# Patient Record
Sex: Female | Born: 1945 | Race: White | Hispanic: No | Smoking: Never smoker
Health system: Southern US, Community
[De-identification: ages and names within clinical notes are randomized; demographics above are authoritative.]

## PROBLEM LIST (undated history)

## (undated) DIAGNOSIS — I779 Disorder of arteries and arterioles, unspecified: Secondary | ICD-10-CM

## (undated) DIAGNOSIS — R0981 Nasal congestion: Secondary | ICD-10-CM

## (undated) DIAGNOSIS — I639 Cerebral infarction, unspecified: Secondary | ICD-10-CM

## (undated) DIAGNOSIS — J3089 Other allergic rhinitis: Secondary | ICD-10-CM

## (undated) DIAGNOSIS — M8580 Other specified disorders of bone density and structure, unspecified site: Secondary | ICD-10-CM

## (undated) DIAGNOSIS — E785 Hyperlipidemia, unspecified: Secondary | ICD-10-CM

## (undated) DIAGNOSIS — K5790 Diverticulosis of intestine, part unspecified, without perforation or abscess without bleeding: Secondary | ICD-10-CM

## (undated) DIAGNOSIS — M1990 Unspecified osteoarthritis, unspecified site: Secondary | ICD-10-CM

## (undated) DIAGNOSIS — G459 Transient cerebral ischemic attack, unspecified: Secondary | ICD-10-CM

## (undated) DIAGNOSIS — E119 Type 2 diabetes mellitus without complications: Secondary | ICD-10-CM

## (undated) DIAGNOSIS — G4730 Sleep apnea, unspecified: Secondary | ICD-10-CM

## (undated) DIAGNOSIS — K219 Gastro-esophageal reflux disease without esophagitis: Secondary | ICD-10-CM

## (undated) HISTORY — PX: TUBAL LIGATION: SHX77

## (undated) HISTORY — DX: Type 2 diabetes mellitus without complications: E11.9

## (undated) HISTORY — PX: BREAST CYST ASPIRATION: SHX578

## (undated) HISTORY — DX: Hyperlipidemia, unspecified: E78.5

---

## 2001-11-09 ENCOUNTER — Other Ambulatory Visit: Admission: RE | Admit: 2001-11-09 | Discharge: 2001-11-09 | Payer: Self-pay | Admitting: Family Medicine

## 2005-01-14 ENCOUNTER — Ambulatory Visit: Payer: Self-pay | Admitting: Family Medicine

## 2006-02-08 ENCOUNTER — Ambulatory Visit: Payer: Self-pay | Admitting: Family Medicine

## 2006-12-02 DIAGNOSIS — G4730 Sleep apnea, unspecified: Secondary | ICD-10-CM | POA: Insufficient documentation

## 2006-12-14 DIAGNOSIS — M19279 Secondary osteoarthritis, unspecified ankle and foot: Secondary | ICD-10-CM | POA: Insufficient documentation

## 2007-02-10 ENCOUNTER — Ambulatory Visit: Payer: Self-pay | Admitting: Family Medicine

## 2007-02-20 DIAGNOSIS — E785 Hyperlipidemia, unspecified: Secondary | ICD-10-CM | POA: Insufficient documentation

## 2007-02-20 DIAGNOSIS — E7800 Pure hypercholesterolemia, unspecified: Secondary | ICD-10-CM | POA: Insufficient documentation

## 2007-02-20 DIAGNOSIS — E1169 Type 2 diabetes mellitus with other specified complication: Secondary | ICD-10-CM | POA: Insufficient documentation

## 2007-02-20 DIAGNOSIS — E782 Mixed hyperlipidemia: Secondary | ICD-10-CM | POA: Insufficient documentation

## 2007-07-05 ENCOUNTER — Other Ambulatory Visit: Payer: Self-pay

## 2007-07-05 ENCOUNTER — Emergency Department: Payer: Self-pay

## 2008-01-07 DIAGNOSIS — R7309 Other abnormal glucose: Secondary | ICD-10-CM | POA: Insufficient documentation

## 2008-02-12 ENCOUNTER — Ambulatory Visit: Payer: Self-pay | Admitting: Family Medicine

## 2008-08-30 HISTORY — PX: BREAST SURGERY: SHX581

## 2008-08-30 HISTORY — PX: BREAST BIOPSY: SHX20

## 2009-04-10 ENCOUNTER — Ambulatory Visit: Payer: Self-pay | Admitting: Family Medicine

## 2009-04-25 DIAGNOSIS — E039 Hypothyroidism, unspecified: Secondary | ICD-10-CM | POA: Insufficient documentation

## 2009-04-27 DIAGNOSIS — L989 Disorder of the skin and subcutaneous tissue, unspecified: Secondary | ICD-10-CM | POA: Insufficient documentation

## 2009-04-28 ENCOUNTER — Ambulatory Visit: Payer: Self-pay | Admitting: Family Medicine

## 2009-06-02 ENCOUNTER — Ambulatory Visit: Payer: Self-pay | Admitting: General Surgery

## 2009-09-01 DIAGNOSIS — Z8673 Personal history of transient ischemic attack (TIA), and cerebral infarction without residual deficits: Secondary | ICD-10-CM | POA: Insufficient documentation

## 2009-09-12 DIAGNOSIS — I658 Occlusion and stenosis of other precerebral arteries: Secondary | ICD-10-CM | POA: Insufficient documentation

## 2010-02-25 LAB — HM PAP SMEAR: HM Pap smear: NEGATIVE

## 2010-04-14 ENCOUNTER — Ambulatory Visit: Payer: Self-pay | Admitting: Family Medicine

## 2011-05-19 ENCOUNTER — Ambulatory Visit: Payer: Self-pay | Admitting: Family Medicine

## 2011-08-26 ENCOUNTER — Ambulatory Visit: Payer: Self-pay | Admitting: Family Medicine

## 2011-10-22 ENCOUNTER — Other Ambulatory Visit: Payer: Self-pay | Admitting: Diagnostic Neuroimaging

## 2011-10-22 DIAGNOSIS — R42 Dizziness and giddiness: Secondary | ICD-10-CM

## 2011-11-02 ENCOUNTER — Ambulatory Visit
Admission: RE | Admit: 2011-11-02 | Discharge: 2011-11-02 | Disposition: A | Discharge status: Home / Self Care | Payer: Medicare Other | Source: Ambulatory Visit | Attending: Diagnostic Neuroimaging | Admitting: Diagnostic Neuroimaging

## 2011-11-02 DIAGNOSIS — R42 Dizziness and giddiness: Secondary | ICD-10-CM

## 2011-11-02 MED ORDER — GADOBENATE DIMEGLUMINE 529 MG/ML IV SOLN
15.0000 mL | Freq: Once | INTRAVENOUS | Status: AC | PRN
  Administered 2011-11-02: 15 mL via INTRAVENOUS

## 2011-11-23 ENCOUNTER — Other Ambulatory Visit: Payer: Self-pay | Admitting: Diagnostic Neuroimaging

## 2011-11-23 DIAGNOSIS — R42 Dizziness and giddiness: Secondary | ICD-10-CM

## 2011-12-15 ENCOUNTER — Ambulatory Visit: Payer: Self-pay | Admitting: Gastroenterology

## 2011-12-15 LAB — HM COLONOSCOPY

## 2012-07-06 ENCOUNTER — Ambulatory Visit: Payer: Self-pay | Admitting: Family Medicine

## 2012-10-31 ENCOUNTER — Ambulatory Visit
Admission: RE | Admit: 2012-10-31 | Discharge: 2012-10-31 | Disposition: A | Discharge status: Home / Self Care | Payer: Medicare Other | Source: Ambulatory Visit | Attending: Diagnostic Neuroimaging | Admitting: Diagnostic Neuroimaging

## 2012-10-31 DIAGNOSIS — R42 Dizziness and giddiness: Secondary | ICD-10-CM

## 2012-11-07 LAB — HM DEXA SCAN

## 2013-03-17 ENCOUNTER — Emergency Department: Payer: Self-pay | Admitting: Emergency Medicine

## 2013-03-17 LAB — URINALYSIS, COMPLETE
Bilirubin,UR: NEGATIVE
Blood: NEGATIVE
Ketone: NEGATIVE
Leukocyte Esterase: NEGATIVE
Nitrite: NEGATIVE
Ph: 7 (ref 4.5–8.0)
Protein: NEGATIVE
Squamous Epithelial: 1
WBC UR: 1 /HPF (ref 0–5)

## 2013-03-17 LAB — CBC
HGB: 14.8 g/dL (ref 12.0–16.0)
MCH: 32.5 pg (ref 26.0–34.0)
MCHC: 34.2 g/dL (ref 32.0–36.0)
MCV: 95 fL (ref 80–100)
Platelet: 213 10*3/uL (ref 150–440)
WBC: 5.2 10*3/uL (ref 3.6–11.0)

## 2013-03-17 LAB — COMPREHENSIVE METABOLIC PANEL
Albumin: 3.8 g/dL (ref 3.4–5.0)
Anion Gap: 3 — ABNORMAL LOW (ref 7–16)
Calcium, Total: 9.1 mg/dL (ref 8.5–10.1)
Chloride: 105 mmol/L (ref 98–107)
Co2: 30 mmol/L (ref 21–32)
Glucose: 192 mg/dL — ABNORMAL HIGH (ref 65–99)
Potassium: 3.6 mmol/L (ref 3.5–5.1)
SGOT(AST): 20 U/L (ref 15–37)
SGPT (ALT): 23 U/L (ref 12–78)

## 2013-04-19 ENCOUNTER — Ambulatory Visit: Payer: Self-pay | Admitting: Family Medicine

## 2013-07-09 ENCOUNTER — Ambulatory Visit: Payer: Self-pay | Admitting: Family Medicine

## 2014-04-05 ENCOUNTER — Ambulatory Visit: Payer: Self-pay | Admitting: Family Medicine

## 2014-06-07 LAB — BASIC METABOLIC PANEL
BUN: 17 mg/dL (ref 4–21)
Creatinine: 0.8 mg/dL (ref <=1.1)
Potassium: 4.3 mmol/L (ref 3.4–5.3)
Sodium: 139 mmol/L (ref 137–147)

## 2014-06-07 LAB — HEPATIC FUNCTION PANEL
ALT: 30 U/L (ref 7–35)
AST: 22 U/L (ref 13–35)

## 2014-06-26 ENCOUNTER — Ambulatory Visit: Payer: Self-pay | Admitting: Family Medicine

## 2014-07-10 LAB — HM MAMMOGRAPHY

## 2014-08-01 ENCOUNTER — Ambulatory Visit: Payer: Self-pay | Admitting: Family Medicine

## 2014-09-24 DIAGNOSIS — G478 Other sleep disorders: Secondary | ICD-10-CM | POA: Diagnosis not present

## 2014-09-24 DIAGNOSIS — G479 Sleep disorder, unspecified: Secondary | ICD-10-CM | POA: Diagnosis not present

## 2014-09-24 DIAGNOSIS — G4733 Obstructive sleep apnea (adult) (pediatric): Secondary | ICD-10-CM | POA: Diagnosis not present

## 2014-09-24 DIAGNOSIS — G4730 Sleep apnea, unspecified: Secondary | ICD-10-CM | POA: Diagnosis not present

## 2014-10-25 DIAGNOSIS — G4733 Obstructive sleep apnea (adult) (pediatric): Secondary | ICD-10-CM | POA: Diagnosis not present

## 2014-10-25 DIAGNOSIS — G479 Sleep disorder, unspecified: Secondary | ICD-10-CM | POA: Diagnosis not present

## 2014-10-25 DIAGNOSIS — G4730 Sleep apnea, unspecified: Secondary | ICD-10-CM | POA: Diagnosis not present

## 2014-10-25 DIAGNOSIS — G478 Other sleep disorders: Secondary | ICD-10-CM | POA: Diagnosis not present

## 2014-10-29 DIAGNOSIS — G4733 Obstructive sleep apnea (adult) (pediatric): Secondary | ICD-10-CM | POA: Diagnosis not present

## 2014-11-06 DIAGNOSIS — R7309 Other abnormal glucose: Secondary | ICD-10-CM | POA: Diagnosis not present

## 2014-11-06 DIAGNOSIS — E119 Type 2 diabetes mellitus without complications: Secondary | ICD-10-CM | POA: Diagnosis not present

## 2014-11-06 DIAGNOSIS — E559 Vitamin D deficiency, unspecified: Secondary | ICD-10-CM | POA: Diagnosis not present

## 2014-11-06 LAB — CBC AND DIFFERENTIAL
HCT: 40 % (ref 36–46)
Hemoglobin: 13.6 g/dL (ref 12.0–16.0)
Platelets: 227 10*3/uL (ref 150–399)
WBC: 4.7 10*3/mL

## 2014-11-06 LAB — LIPID PANEL
CHOLESTEROL: 374 mg/dL — AB (ref 0–200)
HDL: 58 mg/dL (ref 35–70)
LDL Cholesterol: 286 mg/dL
Triglycerides: 150 mg/dL (ref 40–160)

## 2014-11-06 LAB — HEMOGLOBIN A1C: HEMOGLOBIN A1C: 6.7 % — AB (ref 4.0–6.0)

## 2014-11-06 LAB — TSH: TSH: 2.94 u[IU]/mL (ref <=5.90)

## 2014-11-23 DIAGNOSIS — G479 Sleep disorder, unspecified: Secondary | ICD-10-CM | POA: Diagnosis not present

## 2014-11-23 DIAGNOSIS — G4733 Obstructive sleep apnea (adult) (pediatric): Secondary | ICD-10-CM | POA: Diagnosis not present

## 2014-11-23 DIAGNOSIS — G478 Other sleep disorders: Secondary | ICD-10-CM | POA: Diagnosis not present

## 2014-11-23 DIAGNOSIS — G4730 Sleep apnea, unspecified: Secondary | ICD-10-CM | POA: Diagnosis not present

## 2014-12-24 DIAGNOSIS — G4733 Obstructive sleep apnea (adult) (pediatric): Secondary | ICD-10-CM | POA: Diagnosis not present

## 2014-12-24 DIAGNOSIS — G478 Other sleep disorders: Secondary | ICD-10-CM | POA: Diagnosis not present

## 2014-12-24 DIAGNOSIS — G479 Sleep disorder, unspecified: Secondary | ICD-10-CM | POA: Diagnosis not present

## 2014-12-24 DIAGNOSIS — G4730 Sleep apnea, unspecified: Secondary | ICD-10-CM | POA: Diagnosis not present

## 2014-12-25 DIAGNOSIS — M797 Fibromyalgia: Secondary | ICD-10-CM | POA: Insufficient documentation

## 2014-12-25 DIAGNOSIS — G43909 Migraine, unspecified, not intractable, without status migrainosus: Secondary | ICD-10-CM | POA: Insufficient documentation

## 2014-12-25 DIAGNOSIS — F40243 Fear of flying: Secondary | ICD-10-CM | POA: Insufficient documentation

## 2014-12-25 DIAGNOSIS — I6529 Occlusion and stenosis of unspecified carotid artery: Secondary | ICD-10-CM | POA: Insufficient documentation

## 2014-12-25 DIAGNOSIS — E559 Vitamin D deficiency, unspecified: Secondary | ICD-10-CM | POA: Insufficient documentation

## 2014-12-25 DIAGNOSIS — M85832 Other specified disorders of bone density and structure, left forearm: Secondary | ICD-10-CM | POA: Insufficient documentation

## 2014-12-25 DIAGNOSIS — M8580 Other specified disorders of bone density and structure, unspecified site: Secondary | ICD-10-CM | POA: Insufficient documentation

## 2014-12-25 DIAGNOSIS — E119 Type 2 diabetes mellitus without complications: Secondary | ICD-10-CM | POA: Insufficient documentation

## 2014-12-25 DIAGNOSIS — I729 Aneurysm of unspecified site: Secondary | ICD-10-CM | POA: Insufficient documentation

## 2014-12-25 DIAGNOSIS — J309 Allergic rhinitis, unspecified: Secondary | ICD-10-CM | POA: Insufficient documentation

## 2014-12-25 DIAGNOSIS — F40228 Other natural environment type phobia: Secondary | ICD-10-CM | POA: Insufficient documentation

## 2015-01-23 DIAGNOSIS — G479 Sleep disorder, unspecified: Secondary | ICD-10-CM | POA: Diagnosis not present

## 2015-01-23 DIAGNOSIS — G4730 Sleep apnea, unspecified: Secondary | ICD-10-CM | POA: Diagnosis not present

## 2015-01-23 DIAGNOSIS — G4733 Obstructive sleep apnea (adult) (pediatric): Secondary | ICD-10-CM | POA: Diagnosis not present

## 2015-01-23 DIAGNOSIS — G478 Other sleep disorders: Secondary | ICD-10-CM | POA: Diagnosis not present

## 2015-01-29 DIAGNOSIS — G4733 Obstructive sleep apnea (adult) (pediatric): Secondary | ICD-10-CM | POA: Diagnosis not present

## 2015-02-11 ENCOUNTER — Encounter: Payer: Self-pay | Admitting: Family Medicine

## 2015-02-11 ENCOUNTER — Ambulatory Visit (INDEPENDENT_AMBULATORY_CARE_PROVIDER_SITE_OTHER): Payer: Commercial Managed Care - HMO | Admitting: Family Medicine

## 2015-02-11 VITALS — BP 136/78 | HR 64 | Temp 98.2°F | Resp 16 | Ht 65.0 in | Wt 176.0 lb

## 2015-02-11 DIAGNOSIS — E7800 Pure hypercholesterolemia, unspecified: Secondary | ICD-10-CM

## 2015-02-11 DIAGNOSIS — M1990 Unspecified osteoarthritis, unspecified site: Secondary | ICD-10-CM | POA: Diagnosis not present

## 2015-02-11 DIAGNOSIS — E119 Type 2 diabetes mellitus without complications: Secondary | ICD-10-CM

## 2015-02-11 LAB — POCT GLYCOSYLATED HEMOGLOBIN (HGB A1C)

## 2015-02-11 NOTE — Progress Notes (Signed)
Subjective:     Patient ID: Christine Richards, female   DOB: [birth year], 69 y.o.   MRN: [id number]  Diabetes She presents for her follow-up diabetic visit. She has type 2 diabetes mellitus. No MedicAlert identification noted. Her disease course has been stable (Last A1C was 6.7% on 11/06/2014). Pertinent negatives for hypoglycemia include no dizziness, headaches, seizures, speech difficulty or tremors. There are no diabetic associated symptoms. Pertinent negatives for diabetes include no chest pain, no fatigue, no polydipsia, no polyphagia, no polyuria and no weakness. There are no hypoglycemic complications. Symptoms are stable. There are no diabetic complications. Current diabetic treatment includes diet. She is compliant with treatment all of the time. There is no change in her home blood glucose trend. Eye exam is current.  Hyperlipidemia This is a chronic problem. The problem is uncontrolled (Last check was 06/07/2014.  Total Cholesterol:  374; Tri:  150;  HDL:  58;  LDL:  286). Recent lipid tests were reviewed and are high. Exacerbating diseases include diabetes. Associated symptoms include myalgias and shortness of breath (Only when climbing steps.). Pertinent negatives include no chest pain. Current antihyperlipidemic treatment includes exercise and diet change.  Arthritis The disease course has been worsening. She complains of pain and joint swelling (Hands sometimes swell.  ). Pertinent negatives include no diarrhea, fatigue or fever. Her family medical history includes family history of rheumatoid arthritis (Pt reports having an Uncle with RA.).     Patient Active Problem List   Diagnosis Date Noted  . Allergic rhinitis 12/25/2014  . Aneurysm 12/25/2014  . 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 Controlled diabetes mellitus type II without complication 83/25/4982  . Aerophobia 12/25/2014  . Fibrositis 12/25/2014  . Headache, migraine 12/25/2014  . Osteopenia 12/25/2014  . Avitaminosis D  12/25/2014  . 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 Acquired hypothyroidism 04/25/2009  . Abnormal blood sugar 01/07/2008  . Hypercholesteremia 02/20/2007  . 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 Apnea, sleep 12/02/2006   Family History  Problem Relation Age of Onset  . Congestive Heart Failure Mother   . Heart attack Mother   . Dementia Father   . Heart attack Father   . Prostate cancer Father   . Atrial fibrillation Sister   . Hyperlipidemia Sister   . Heart disease Brother    History   Social History  . Marital Status: Divorced    Spouse Name: N/A  . Number of Children: 2  . Years of Education: College   Occupational History  .      Full Time   Social History Main Topics  . Smoking status: Never Smoker   . Smokeless tobacco: Former Systems developer  . Alcohol Use: No  . Drug Use: No  . Sexual Activity: Not on file   Other Topics Concern  . Not on file   Social History Narrative   Past Surgical History  Procedure Laterality Date  . Tubal ligation    . Breast surgery  2010    biopsy   Allergies  Allergen Reactions  . Nitrofurantoin   . Statins     Other reaction(s): Muscle Pain, Weakness Statins   Current Outpatient Prescriptions on File Prior to Visit  Medication Sig Dispense Refill  . Cholecalciferol (VITAMIN D) 2000 UNITS CAPS Take 1 tablet by mouth 2 (two) times daily.    . Omega-3  Fatty Acids (FISH OIL) 1000 MG CAPS Take 2 tablets by mouth daily.     No current facility-administered medications on file prior to visit.     Review of Systems  Constitutional: Negative for fever, chills, diaphoresis, activity change, appetite change, fatigue and unexpected weight change.  Respiratory: Positive for shortness of breath (Only when climbing steps.). Negative for apnea, cough,  choking, chest tightness, wheezing and stridor.   Cardiovascular: Negative for chest pain, palpitations and leg swelling.  Gastrointestinal: Negative for nausea, vomiting, abdominal pain, diarrhea, constipation, blood in stool, abdominal distention, anal bleeding and rectal pain.  Endocrine: Negative for cold intolerance, heat intolerance, polydipsia, polyphagia and polyuria.  Musculoskeletal: Positive for myalgias, joint swelling (Hands sometimes swell.  ), arthralgias (Pt is concerned of RA.  ) and arthritis. Negative for back pain, gait problem, neck pain and neck stiffness.  Neurological: Negative for dizziness, tremors, seizures, syncope, facial asymmetry, speech difficulty, weakness, light-headedness, numbness and headaches.       Objective:   Physical Exam  Constitutional: She appears well-developed and well-nourished.  Musculoskeletal: Normal range of motion.  Skin: Skin is warm and dry.  Psychiatric: She has a normal mood and affect. Her behavior is normal. Judgment and thought content normal.   BP 136/78 mmHg  Pulse 64  Temp(Src) 98.2 F (36.8 C) (Oral)  Resp 16  Ht _0  (1.651 m)  Wt 176 lb (79.833 kg)  BMI 29.29 kg/m2       Assessment:     See below.     Plan:     1. Hypercholesteremia  Will not recheck today.   2. Controlled diabetes mellitus type II without complication Stable. Continue to tweak lifestyle. Does not want medication at this time. Has hard time with meds.  - POCT HgB A1C  3. Arthritis  Concerned about RA. No joint changes consistent with OA, will check labs.  Further plan pending these results.  - Rheumatoid Factor - Cyclic citrul peptide antibody, IgG - Sed Rate (ESR) - Antinuclear Antib (ANA) .  Follow up for Wellness in October.  Margarita Rana, MD

## 2015-02-12 ENCOUNTER — Other Ambulatory Visit: Payer: Self-pay | Admitting: Family Medicine

## 2015-02-12 DIAGNOSIS — M1990 Unspecified osteoarthritis, unspecified site: Secondary | ICD-10-CM | POA: Diagnosis not present

## 2015-02-12 LAB — SEDIMENTATION RATE: SED RATE: 5 mm/h (ref 0–40)

## 2015-02-13 ENCOUNTER — Telehealth: Payer: Self-pay

## 2015-02-13 LAB — RHEUMATOID FACTOR

## 2015-02-13 LAB — ANA: Anti Nuclear Antibody(ANA): NEGATIVE

## 2015-02-13 NOTE — Telephone Encounter (Signed)
-----   Message from Margarita Rana, MD sent at 02/13/2015  2:00 PM EDT ----- Let patient know ANA resulted after all and was negative. Thanks.

## 2015-02-13 NOTE — Telephone Encounter (Signed)
Pt advised as directed below.   Thanks,   -Laura  

## 2015-02-14 ENCOUNTER — Telehealth: Payer: Self-pay

## 2015-02-14 LAB — CYCLIC CITRUL PEPTIDE ANTIBODY, IGG/IGA: Cyclic Citrullin Peptide Ab: 40 units — ABNORMAL HIGH (ref 0–19)

## 2015-02-14 NOTE — Telephone Encounter (Signed)
-----   Message from Margarita Rana, MD sent at 02/14/2015  1:55 PM EDT ----- Last arthritis test did come back mildly positive after all. If joint pain persists or worsens, would be reasonable to think of seeing rheumatologist.  Thanks.

## 2015-02-14 NOTE — Telephone Encounter (Signed)
LMTCB Emily Drozdowski, CMA  

## 2015-02-14 NOTE — Telephone Encounter (Signed)
LMTCB Tyshauna Finkbiner Drozdowski, CMA  

## 2015-02-18 NOTE — Telephone Encounter (Signed)
Informed pt of labs results. Pt verbally acknowledges understanding and is in agreement with the treatment plan. Renaldo Fiddler, CMA

## 2015-02-18 NOTE — Telephone Encounter (Signed)
LMTCB. sd  

## 2015-02-23 DIAGNOSIS — G478 Other sleep disorders: Secondary | ICD-10-CM | POA: Diagnosis not present

## 2015-02-23 DIAGNOSIS — G479 Sleep disorder, unspecified: Secondary | ICD-10-CM | POA: Diagnosis not present

## 2015-02-23 DIAGNOSIS — G4730 Sleep apnea, unspecified: Secondary | ICD-10-CM | POA: Diagnosis not present

## 2015-02-23 DIAGNOSIS — G4733 Obstructive sleep apnea (adult) (pediatric): Secondary | ICD-10-CM | POA: Diagnosis not present

## 2015-03-10 ENCOUNTER — Telehealth: Payer: Self-pay | Admitting: Family Medicine

## 2015-03-10 DIAGNOSIS — M1990 Unspecified osteoarthritis, unspecified site: Secondary | ICD-10-CM

## 2015-03-10 NOTE — Telephone Encounter (Signed)
Order in. Thanks!

## 2015-03-10 NOTE — Telephone Encounter (Signed)
Pt is requesting referral to see rheumatologist for RA.She said she had talked to you concerning this but there is no order,Thanks

## 2015-03-19 DIAGNOSIS — H2513 Age-related nuclear cataract, bilateral: Secondary | ICD-10-CM | POA: Diagnosis not present

## 2015-03-21 ENCOUNTER — Encounter: Payer: Self-pay | Admitting: Family Medicine

## 2015-03-21 NOTE — Telephone Encounter (Signed)
Need to make appointment for annual physical due in October.  This message came from patient from My chart-thank you aa

## 2015-03-25 ENCOUNTER — Encounter: Payer: Self-pay | Admitting: Family Medicine

## 2015-03-25 DIAGNOSIS — G4733 Obstructive sleep apnea (adult) (pediatric): Secondary | ICD-10-CM | POA: Diagnosis not present

## 2015-03-25 DIAGNOSIS — G4730 Sleep apnea, unspecified: Secondary | ICD-10-CM | POA: Diagnosis not present

## 2015-03-25 DIAGNOSIS — G479 Sleep disorder, unspecified: Secondary | ICD-10-CM | POA: Diagnosis not present

## 2015-03-25 DIAGNOSIS — G478 Other sleep disorders: Secondary | ICD-10-CM | POA: Diagnosis not present

## 2015-04-23 DIAGNOSIS — M545 Low back pain: Secondary | ICD-10-CM | POA: Diagnosis not present

## 2015-04-23 DIAGNOSIS — M79641 Pain in right hand: Secondary | ICD-10-CM | POA: Diagnosis not present

## 2015-04-23 DIAGNOSIS — M150 Primary generalized (osteo)arthritis: Secondary | ICD-10-CM | POA: Diagnosis not present

## 2015-04-23 DIAGNOSIS — M79642 Pain in left hand: Secondary | ICD-10-CM | POA: Diagnosis not present

## 2015-04-23 DIAGNOSIS — G8929 Other chronic pain: Secondary | ICD-10-CM | POA: Diagnosis not present

## 2015-04-24 ENCOUNTER — Encounter: Payer: Self-pay | Admitting: Family Medicine

## 2015-04-25 ENCOUNTER — Other Ambulatory Visit: Payer: Self-pay | Admitting: Family Medicine

## 2015-04-25 DIAGNOSIS — G479 Sleep disorder, unspecified: Secondary | ICD-10-CM | POA: Diagnosis not present

## 2015-04-25 DIAGNOSIS — G478 Other sleep disorders: Secondary | ICD-10-CM | POA: Diagnosis not present

## 2015-04-25 DIAGNOSIS — G4733 Obstructive sleep apnea (adult) (pediatric): Secondary | ICD-10-CM | POA: Diagnosis not present

## 2015-04-25 DIAGNOSIS — G4730 Sleep apnea, unspecified: Secondary | ICD-10-CM | POA: Diagnosis not present

## 2015-04-25 NOTE — Telephone Encounter (Signed)
Talked with patient. Concerned about a stroke or TIA.  Afraid of Mobic and stroke risk. Not currently on an Baby ASA.  Recommended restart Baby ASA.  Try  Mobic short term and monitor blood pressure. Call if any further concerns.

## 2015-05-26 DIAGNOSIS — G8929 Other chronic pain: Secondary | ICD-10-CM | POA: Diagnosis not present

## 2015-05-26 DIAGNOSIS — M0579 Rheumatoid arthritis with rheumatoid factor of multiple sites without organ or systems involvement: Secondary | ICD-10-CM | POA: Diagnosis not present

## 2015-05-26 DIAGNOSIS — M545 Low back pain: Secondary | ICD-10-CM | POA: Diagnosis not present

## 2015-05-26 DIAGNOSIS — M150 Primary generalized (osteo)arthritis: Secondary | ICD-10-CM | POA: Diagnosis not present

## 2015-08-06 ENCOUNTER — Encounter: Payer: Self-pay | Admitting: Family Medicine

## 2015-08-06 ENCOUNTER — Ambulatory Visit (INDEPENDENT_AMBULATORY_CARE_PROVIDER_SITE_OTHER): Payer: Commercial Managed Care - HMO | Admitting: Family Medicine

## 2015-08-06 VITALS — BP 136/80 | HR 68 | Temp 97.6°F | Resp 16 | Ht 66.0 in | Wt 176.0 lb

## 2015-08-06 DIAGNOSIS — Z1231 Encounter for screening mammogram for malignant neoplasm of breast: Secondary | ICD-10-CM | POA: Diagnosis not present

## 2015-08-06 DIAGNOSIS — E7800 Pure hypercholesterolemia, unspecified: Secondary | ICD-10-CM | POA: Diagnosis not present

## 2015-08-06 DIAGNOSIS — E039 Hypothyroidism, unspecified: Secondary | ICD-10-CM

## 2015-08-06 DIAGNOSIS — Z0000 Encounter for general adult medical examination without abnormal findings: Secondary | ICD-10-CM

## 2015-08-06 DIAGNOSIS — E119 Type 2 diabetes mellitus without complications: Secondary | ICD-10-CM | POA: Diagnosis not present

## 2015-08-06 DIAGNOSIS — Z23 Encounter for immunization: Secondary | ICD-10-CM | POA: Diagnosis not present

## 2015-08-06 DIAGNOSIS — Z126 Encounter for screening for malignant neoplasm of bladder: Secondary | ICD-10-CM | POA: Diagnosis not present

## 2015-08-06 LAB — POCT URINALYSIS DIPSTICK
BILIRUBIN UA: NEGATIVE
GLUCOSE UA: NEGATIVE
KETONES UA: NEGATIVE
Leukocytes, UA: NEGATIVE
NITRITE UA: NEGATIVE
PH UA: 6
Protein, UA: NEGATIVE
RBC UA: NEGATIVE
Spec Grav, UA: 1.01
Urobilinogen, UA: 0.2

## 2015-08-06 LAB — POCT UA - MICROALBUMIN: Microalbumin Ur, POC: NEGATIVE mg/L

## 2015-08-06 NOTE — Progress Notes (Signed)
Patient: Christine Richards, Female    DOB: [birth year], 69 y.o.   MRN: [id number] Visit Date: 08/06/2015  Today's Provider: Margarita Rana, MD   Chief Complaint  Patient presents with  . Medicare Wellness   Subjective:    Annual wellness visit Lavisha Chesbrough is a 69 y.o. female. She feels well. She reports exercising 4 days a week for 1 hour. She reports she is sleeping well.  Last AWV- 06/05/2014 Last Mammo- 08/01/2014- BI-RADS 1 Last Colon- 12/15/2011- Diverticulosis- recheck 5 years  Last BMD- 06/26/2014- osteopenia  -----------------------------------------------------------   Review of Systems  Constitutional: Positive for fatigue.  HENT: Positive for tinnitus.   Respiratory: Positive for apnea.   Gastrointestinal: Positive for abdominal distention.  Genitourinary: Positive for urgency.  Musculoskeletal: Positive for arthralgias.  All other systems reviewed and are negative.   Social History   Social History  . Marital Status: Divorced    Spouse Name: N/A  . Number of Children: 2  . Years of Education: College   Occupational History  . [employer]     Full Time   Social History Main Topics  . Smoking status: Never Smoker   . Smokeless tobacco: Never Used  . Alcohol Use: No  . Drug Use: No  . Sexual Activity: Not on file   Other Topics Concern  . Not on file   Social History Narrative    Patient Active Problem List   Diagnosis Date Noted  . Arthritis 02/11/2015  . Allergic rhinitis 12/25/2014  . Aneurysm ([place]) 12/25/2014  . 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 Controlled diabetes mellitus type II without complication ([place]) [id number]  . Aerophobia 12/25/2014  . Fibrositis 12/25/2014  . Headache, migraine 12/25/2014  . Osteopenia 12/25/2014  . Avitaminosis D 12/25/2014  . 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 Personal history of transient ischemic attack (TIA) and cerebral  infarction without residual deficit 09/01/2009  . 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 Acquired hypothyroidism 04/25/2009  . Abnormal blood sugar 01/07/2008  . Hypercholesteremia 02/20/2007  . 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 Apnea, sleep 12/02/2006    Past Surgical History  Procedure Laterality Date  . Tubal ligation    . Breast surgery  2010    biopsy    Her family history includes Atrial fibrillation in her sister; Congestive Heart Failure in her mother; Dementia in her father; Heart attack in her father and mother; Heart disease in her brother; Hyperlipidemia in her sister; Prostate cancer in her father.    Previous Medications   ASPIRIN 81 MG TABLET    Take 81 mg by mouth daily.   CHOLECALCIFEROL (VITAMIN D) 2000 UNITS CAPS    Take 1 tablet by mouth 2 (two) times daily.   OMEGA-3 FATTY ACIDS (FISH OIL) 1000 MG CAPS    Take 2 tablets by mouth daily.    Patient Care Team: Margarita Rana, MD as PCP - General (Family Medicine)     Objective:   Vitals: BP 136/80 mmHg  Pulse 68  Temp(Src) 97.6 F (36.4 C) (Oral)  Resp 16  Ht 5\' 6"  (1.676 m)  Wt 176 lb (79.833 kg)  BMI 28.42 kg/m2  Physical Exam  Constitutional: She is oriented to person, place, and time. She appears well-developed and well-nourished. No distress.  HENT:  Head: Normocephalic and atraumatic.  Right Ear: External ear normal.  Left Ear: External ear normal.  Nose: Nose normal.  Mouth/Throat: Oropharynx is  clear and moist. No oropharyngeal exudate.  Eyes: Conjunctivae and EOM are normal. Pupils are equal, round, and reactive to light. Right eye exhibits no discharge. Left eye exhibits no discharge. No scleral icterus.  Neck: Normal range of motion. Neck supple. No JVD present. No tracheal deviation present. No thyromegaly present.  Cardiovascular: Normal rate, regular rhythm, normal heart sounds and intact distal pulses.  Exam reveals no gallop and no friction rub.   No murmur heard. Pulmonary/Chest: Effort  normal and breath sounds normal. No stridor. No respiratory distress. She has no wheezes. She has no rales. She exhibits no tenderness.  Abdominal: Soft. Bowel sounds are normal. She exhibits no distension and no mass. There is no tenderness. There is no rebound and no guarding.  Genitourinary: No breast swelling, tenderness, discharge or bleeding.  Musculoskeletal: Normal range of motion. She exhibits no edema or tenderness.  Lymphadenopathy:    She has no cervical adenopathy.  Neurological: She is alert and oriented to person, place, and time. She has normal reflexes. She displays normal reflexes. No cranial nerve deficit. She exhibits normal muscle tone. Coordination normal.  Skin: Skin is warm and dry. No rash noted. She is not diaphoretic. No erythema. No pallor.  Psychiatric: She has a normal mood and affect. Her behavior is normal. Judgment and thought content normal.    Activities of Daily Living In your present state of health, do you have any difficulty performing the following activities: 08/06/2015 02/11/2015  Hearing? Y N  Vision? Y N  Difficulty concentrating or making decisions? N N  Walking or climbing stairs? N N  Dressing or bathing? N N  Doing errands, shopping? N N    Fall Risk Assessment Fall Risk  08/06/2015 02/11/2015  Falls in the past year? No No     Depression Screen PHQ 2/9 Scores 08/06/2015 02/11/2015  PHQ - 2 Score 0 0    Cognitive Testing - 6-CIT  Correct? Score   What year is it? yes 0 0 or 4  What month is it? yes 0 0 or 3  Memorize:    Pia Mau,  42,  [address]  [place],      What time is it? (within 1 hour) yes 0 0 or 3  Count backwards from 20 yes 0 0, 2, or 4  Name the months of the year yes 0 0, 2, or 4  Repeat name & address above yes 0 0, 2, 4, 6, 8, or 10       TOTAL SCORE  0/28   Interpretation:  Normal  Normal (0-7) Abnormal (8-28)       Assessment & Plan:     Annual Wellness Visit  Reviewed patient's Family Medical  History Reviewed and updated list of patient's medical providers Assessment of cognitive impairment was done Assessed patient's functional ability Established a written schedule for health screening [place] Completed and Reviewed  Exercise Activities and Dietary recommendations Goals    None      Immunization History  Administered Date(s) Administered  . Tdap 12/22/2007    Health Maintenance  Topic Date Due  . Hepatitis C Screening  May 12, 1946  . FOOT EXAM  09/06/1955  . OPHTHALMOLOGY EXAM  09/06/1955  . URINE MICROALBUMIN  09/06/1955  . ZOSTAVAX  09/05/2005  . PNA vac Low Risk Adult (1 of 2 - PCV13) 09/05/2010  . INFLUENZA VACCINE  03/31/2015  . HEMOGLOBIN A1C  08/13/2015  . MAMMOGRAM  07/10/2016  . TETANUS/TDAP  12/21/2017  .  COLONOSCOPY  12/14/2021  . DEXA SCAN  Completed      Discussed health benefits of physical activity, and encouraged her to engage in regular exercise appropriate for her age and condition.    ------------------------------------------------------------------------------------------------------------  1. Medicare annual wellness visit, subsequent Stable. As above.  2. Controlled type 2 diabetes mellitus without complication, without long-term current use of insulin (HCC) Urine microalbumin negative. FU pending lab results. - POCT UA - Microalbumin - Comprehensive metabolic panel - Hemoglobin A1c  3. Acquired hypothyroidism FU pending results. - TSH  4. Bladder cancer screening UA negative. - POCT urinalysis dipstick Results for orders placed or performed in visit on 08/06/15  POCT urinalysis dipstick  Result Value Ref Range   Color, UA Straw    Clarity, UA Clear    Glucose, UA Negative    Bilirubin, UA Negative    Ketones, UA Negative    Spec Grav, UA 1.010    Blood, UA Negative    pH, UA 6.0    Protein, UA Negative    Urobilinogen, UA 0.2    Nitrite, UA Negative    Leukocytes, UA Negative Negative   POCT UA - Microalbumin  Result Value Ref Range   Microalbumin Ur, POC Negative mg/L     5. Hypercholesteremia Pt cannot tolerate statins. Pt would like to check lipid panel. - CBC with Differential/Platelet - Lipid panel  6. Encounter for screening mammogram for breast cancer FU pending results. - MM DIGITAL SCREENING BILATERAL; Future  7. Need for pneumococcal vaccination Administered in office. - Pneumococcal conjugate vaccine 13-valent IM  Patient seen and examined by Jerrell Belfast, MD, and note scribed by Renaldo Fiddler, CMA. I have reviewed the document for accuracy and completeness and I agree with above. Jerrell Belfast, MD  Margarita Rana, MD

## 2015-08-14 DIAGNOSIS — E039 Hypothyroidism, unspecified: Secondary | ICD-10-CM | POA: Diagnosis not present

## 2015-08-14 DIAGNOSIS — E7800 Pure hypercholesterolemia, unspecified: Secondary | ICD-10-CM | POA: Diagnosis not present

## 2015-08-14 DIAGNOSIS — E119 Type 2 diabetes mellitus without complications: Secondary | ICD-10-CM | POA: Diagnosis not present

## 2015-08-15 ENCOUNTER — Telehealth: Payer: Self-pay

## 2015-08-15 LAB — CBC WITH DIFFERENTIAL/PLATELET
BASOS: 1 %
Basophils Absolute: 0 10*3/uL (ref 0.0–0.2)
EOS (ABSOLUTE): 0.1 10*3/uL (ref 0.0–0.4)
EOS: 2 %
HEMATOCRIT: 42 % (ref 34.0–46.6)
Hemoglobin: 14 g/dL (ref 11.1–15.9)
Immature Grans (Abs): 0 10*3/uL (ref 0.0–0.1)
Immature Granulocytes: 0 %
LYMPHS ABS: 1.6 10*3/uL (ref 0.7–3.1)
Lymphs: 33 %
MCH: 31.6 pg (ref 26.6–33.0)
MCHC: 33.3 g/dL (ref 31.5–35.7)
MCV: 95 fL (ref 79–97)
MONOS ABS: 0.3 10*3/uL (ref 0.1–0.9)
Monocytes: 7 %
NEUTROS ABS: 2.8 10*3/uL (ref 1.4–7.0)
NEUTROS PCT: 57 %
PLATELETS: 215 10*3/uL (ref 150–379)
RBC: 4.43 x10E6/uL (ref 3.77–5.28)
RDW: 13.3 % (ref 12.3–15.4)
WBC: 4.9 10*3/uL (ref 3.4–10.8)

## 2015-08-15 LAB — COMPREHENSIVE METABOLIC PANEL
A/G RATIO: 1.9 (ref 1.1–2.5)
ALT: 35 IU/L — AB (ref 0–32)
AST: 24 IU/L (ref 0–40)
Albumin: 4.4 g/dL (ref 3.6–4.8)
Alkaline Phosphatase: 98 IU/L (ref 39–117)
BILIRUBIN TOTAL: 0.6 mg/dL (ref 0.0–1.2)
BUN/Creatinine Ratio: 26 (ref 11–26)
BUN: 17 mg/dL (ref 8–27)
CALCIUM: 9.3 mg/dL (ref 8.7–10.3)
CHLORIDE: 98 mmol/L (ref 96–106)
CO2: 23 mmol/L (ref 18–29)
Creatinine, Ser: 0.65 mg/dL (ref 0.57–1.00)
GFR calc Af Amer: 105 mL/min/{1.73_m2} (ref >59)
GFR, EST NON AFRICAN AMERICAN: 91 mL/min/{1.73_m2} (ref >59)
GLOBULIN, TOTAL: 2.3 g/dL (ref 1.5–4.5)
Glucose: 149 mg/dL — ABNORMAL HIGH (ref 65–99)
POTASSIUM: 3.9 mmol/L (ref 3.5–5.2)
SODIUM: 141 mmol/L (ref 134–144)
Total Protein: 6.7 g/dL (ref 6.0–8.5)

## 2015-08-15 LAB — LIPID PANEL
CHOLESTEROL TOTAL: 338 mg/dL — AB (ref 100–199)
Chol/HDL Ratio: 7.2 ratio units — ABNORMAL HIGH (ref 0.0–4.4)
HDL: 47 mg/dL (ref >39)
LDL CALC: 232 mg/dL — AB (ref 0–99)
Triglycerides: 297 mg/dL — ABNORMAL HIGH (ref 0–149)
VLDL CHOLESTEROL CAL: 59 mg/dL — AB (ref 5–40)

## 2015-08-15 LAB — HEMOGLOBIN A1C
ESTIMATED AVERAGE GLUCOSE: 160 mg/dL
Hgb A1c MFr Bld: 7.2 % — ABNORMAL HIGH (ref 4.8–5.6)

## 2015-08-15 LAB — TSH: TSH: 3.43 u[IU]/mL (ref 0.450–4.500)

## 2015-08-15 NOTE — Telephone Encounter (Signed)
-----   Message from Margarita Rana, MD sent at 08/15/2015  1:40 PM EST ----- Labs fairly stable.  Blood sugar is slightly elevated at 7.2. Really want it around 6.5  Recommend eat healthy and exercise and recheck in 3 months.  ALso, cholesterol still elevated at 338.   Thanks.

## 2015-08-15 NOTE — Telephone Encounter (Signed)
LMTCB 08/15/2015  Thanks,   -Mickel Baas

## 2015-08-15 NOTE — Telephone Encounter (Signed)
Pt advised.   Thanks,   -Laura  

## 2015-09-23 ENCOUNTER — Ambulatory Visit
Admission: RE | Admit: 2015-09-23 | Discharge: 2015-09-23 | Disposition: A | Discharge status: Home / Self Care | Payer: Medicare HMO | Source: Ambulatory Visit | Attending: Family Medicine | Admitting: Family Medicine

## 2015-09-23 ENCOUNTER — Other Ambulatory Visit: Payer: Self-pay | Admitting: Family Medicine

## 2015-09-23 DIAGNOSIS — Z1231 Encounter for screening mammogram for malignant neoplasm of breast: Secondary | ICD-10-CM | POA: Insufficient documentation

## 2015-09-25 DIAGNOSIS — G4733 Obstructive sleep apnea (adult) (pediatric): Secondary | ICD-10-CM | POA: Diagnosis not present

## 2015-10-23 ENCOUNTER — Ambulatory Visit (INDEPENDENT_AMBULATORY_CARE_PROVIDER_SITE_OTHER): Payer: Medicare HMO | Admitting: Family Medicine

## 2015-10-23 ENCOUNTER — Encounter: Payer: Self-pay | Admitting: Family Medicine

## 2015-10-23 VITALS — BP 132/68 | HR 68 | Temp 97.7°F | Resp 16 | Wt 182.0 lb

## 2015-10-23 DIAGNOSIS — M5431 Sciatica, right side: Secondary | ICD-10-CM

## 2015-10-23 DIAGNOSIS — M25511 Pain in right shoulder: Secondary | ICD-10-CM | POA: Diagnosis not present

## 2015-10-23 NOTE — Progress Notes (Signed)
Subjective:    Patient ID: Christine Richards, female    DOB: 26-Jan-1946, 70 y.o.   MRN: [id number]  Hip Pain  The injury mechanism is unknown (Pain has been present for a "few weeks" per pt. Pt states she works out at [employer].Pt believes she could have pulled a muscle while at the gym, but does not remember exactly when the incident occured). The pain is present in the right hip. The quality of the pain is described as shooting. The pain is at a severity of 6/10. The pain is moderate. The pain has been fluctuating (Pt reports the pain only occurs when she moves a certain way, then the pain goes away) since onset. Pertinent negatives include no inability to bear weight, loss of motion, loss of sensation, muscle weakness, numbness or tingling. The symptoms are aggravated by movement. She has tried nothing for the symptoms.   Shoulder Pain Pt is also c/o right shoulder pain. Believes this is a "torn rotator cuff". Pt is requesting for provider to examine shoulder at [facility] today.  Review of Systems  Musculoskeletal: Positive for arthralgias.  Neurological: Negative for tingling and numbness.   BP 132/68 mmHg  Pulse 68  Temp(Src) 97.7 F (36.5 C) (Oral)  Resp 16  Wt 182 lb (82.555 kg)   Patient Active Problem List   Diagnosis Date Noted  . Arthritis 02/11/2015  . Allergic rhinitis 12/25/2014  . Aneurysm ([place]) 12/25/2014  . 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 Controlled diabetes mellitus type II without complication ([place]) [id number]  . Aerophobia 12/25/2014  . Fibrositis 12/25/2014  . Headache, migraine 12/25/2014  . Osteopenia 12/25/2014  . Avitaminosis D 12/25/2014  . 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 Acquired hypothyroidism 04/25/2009  . Abnormal blood sugar 01/07/2008  .  Hypercholesteremia 02/20/2007  . 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 Apnea, sleep 12/02/2006   No past medical history on file. Current Outpatient Prescriptions on File Prior to Visit  Medication Sig  . aspirin 81 MG tablet Take 81 mg by mouth daily.  . Cholecalciferol (VITAMIN D) 2000 UNITS CAPS Take 1 tablet by mouth 2 (two) times daily.  . Omega-3 Fatty Acids (FISH OIL) 1000 MG CAPS Take 2 tablets by mouth daily.   No current facility-administered medications on file prior to visit.   Allergies  Allergen Reactions  . Nitrofurantoin   . Statins     Other reaction(s): Muscle Pain, Weakness Statins   Past Surgical History  Procedure Laterality Date  . Tubal ligation    . Breast surgery  2010    biopsy  . Breast biopsy Right 2010    benign  . Breast cyst aspiration Right    Social History   Social History  . Marital Status: Divorced    Spouse Name: N/A  . Number of Children: 2  . Years of Education: College   Occupational History  . [employer]     Full Time   Social History Main Topics  . Smoking status: Never Smoker   . Smokeless tobacco: Never Used  . Alcohol Use: No  . Drug Use: No  . Sexual Activity: Not on file   Other Topics Concern  . Not on file   Social History Narrative   Family History  Problem Relation Age of Onset  . Congestive Heart Failure  Mother   . Heart attack Mother   . Dementia Father   . Heart attack Father   . Prostate cancer Father   . Atrial fibrillation Sister   . Hyperlipidemia Sister   . Heart disease Brother        Objective:   Physical Exam  Constitutional: She is oriented to person, place, and time. She appears well-developed and well-nourished.  Cardiovascular: Normal rate and regular rhythm.   Pulmonary/Chest: Effort normal and breath sounds normal.  Musculoskeletal: She exhibits tenderness (over right anterior shoulder. No laxity elicted today.  ).  Tender over right buttock, negative straight  leg raises. No pain over bursa or with internal and external rotation.    Neurological: She is alert and oriented to person, place, and time.  Psychiatric: She has a normal mood and affect. Her behavior is normal. Judgment and thought content normal.   BP 132/68 mmHg  Pulse 68  Temp(Src) 97.7 F (36.5 C) (Oral)  Resp 16  Wt 182 lb (82.555 kg)     Assessment & Plan:  1. Sciatica of right side New problem. Intermittent. Unclear if needs to change her exercise regiment.  - Ambulatory referral to Orthopedics  2. Right shoulder pain Also new problem.  Tender to palpation. Improved not that she is not lifting. But will refer to establish diagnosis and better clarity on what she can and cannot do.    Patient was seen and examined by Jerrell Belfast, MD, and note scribed by Renaldo Fiddler, CMA. I have reviewed the document for accuracy and completeness and I agree with above. Jerrell Belfast, MD   Margarita Rana, MD

## 2015-10-26 DIAGNOSIS — G4733 Obstructive sleep apnea (adult) (pediatric): Secondary | ICD-10-CM | POA: Diagnosis not present

## 2015-10-31 DIAGNOSIS — H1033 Unspecified acute conjunctivitis, bilateral: Secondary | ICD-10-CM | POA: Diagnosis not present

## 2015-11-07 DIAGNOSIS — H1033 Unspecified acute conjunctivitis, bilateral: Secondary | ICD-10-CM | POA: Diagnosis not present

## 2015-11-18 DIAGNOSIS — M5416 Radiculopathy, lumbar region: Secondary | ICD-10-CM | POA: Diagnosis not present

## 2015-11-18 DIAGNOSIS — M5136 Other intervertebral disc degeneration, lumbar region: Secondary | ICD-10-CM | POA: Diagnosis not present

## 2015-11-20 ENCOUNTER — Ambulatory Visit (INDEPENDENT_AMBULATORY_CARE_PROVIDER_SITE_OTHER): Payer: Medicare HMO | Admitting: Family Medicine

## 2015-11-20 ENCOUNTER — Encounter: Payer: Self-pay | Admitting: Family Medicine

## 2015-11-20 VITALS — BP 136/70 | HR 68 | Temp 97.5°F | Resp 16 | Wt 180.0 lb

## 2015-11-20 DIAGNOSIS — K219 Gastro-esophageal reflux disease without esophagitis: Secondary | ICD-10-CM

## 2015-11-20 DIAGNOSIS — E119 Type 2 diabetes mellitus without complications: Secondary | ICD-10-CM

## 2015-11-20 DIAGNOSIS — R1310 Dysphagia, unspecified: Secondary | ICD-10-CM | POA: Diagnosis not present

## 2015-11-20 DIAGNOSIS — F418 Other specified anxiety disorders: Secondary | ICD-10-CM | POA: Diagnosis not present

## 2015-11-20 DIAGNOSIS — R69 Illness, unspecified: Secondary | ICD-10-CM | POA: Diagnosis not present

## 2015-11-20 LAB — POCT GLYCOSYLATED HEMOGLOBIN (HGB A1C)
Est. average glucose Bld gHb Est-mCnc: 154
Hemoglobin A1C: 7

## 2015-11-20 MED ORDER — ALPRAZOLAM 0.5 MG PO TABS: 0.5000 mg | ORAL_TABLET | Freq: Two times a day (BID) | ORAL | Status: DC | PRN

## 2015-11-20 NOTE — Progress Notes (Signed)
Subjective:    Patient ID: Christine Richards, female    DOB: 1946-07-30, 70 y.o.   MRN: [id number]  Diabetes She presents for her follow-up (Last A1C was 08/14/2015 and was 7.2%) diabetic visit. She has type 2 diabetes mellitus. There are no hypoglycemic associated symptoms. Pertinent negatives for diabetes include no blurred vision, no chest pain, no fatigue, no foot paresthesias, no foot ulcerations, no polydipsia, no polyphagia, no polyuria, no visual change, no weakness and no weight loss. Symptoms are stable. Risk factors for coronary artery disease include dyslipidemia, diabetes mellitus, post-menopausal and family history. Current diabetic treatment includes diet. She is compliant with treatment all of the time. She is following a generally healthy diet. She participates in exercise every other day (1 hour at gym weight lifting, 20 minutes of cardio). Home blood sugar record trend: not being checked. An ACE inhibitor/angiotensin II receptor blocker is not being taken. Eye exam is current (October 2016, [place]).   Dysphagia Pt reports she has pain when swallowing. Does admit to heartburn sx. Does feel like things get stuck at times.  Painful.  May not be drinking enough water.     Review of Systems  Constitutional: Negative for weight loss and fatigue.  Eyes: Negative for blurred vision.  Cardiovascular: Negative for chest pain.  Gastrointestinal:       Dysphagia present  Endocrine: Negative for polydipsia, polyphagia and polyuria.  Neurological: Negative for weakness.   BP 136/70 mmHg  Pulse 68  Temp(Src) 97.5 F (36.4 C) (Oral)  Resp 16  Wt 180 lb (81.647 kg)   Patient Active Problem List   Diagnosis Date Noted  . Arthritis 02/11/2015  . Allergic rhinitis 12/25/2014  . Aneurysm ([place]) 12/25/2014  . 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 Controlled diabetes mellitus type II without complication ([place]) [id number]  . Aerophobia 12/25/2014  . Fibrositis 12/25/2014  .  Headache, migraine 12/25/2014  . Osteopenia 12/25/2014  . Avitaminosis D 12/25/2014  . 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 Acquired hypothyroidism 04/25/2009  . Abnormal blood sugar 01/07/2008  . Hypercholesteremia 02/20/2007  . 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 Apnea, sleep 12/02/2006   No past medical history on file. Current Outpatient Prescriptions on File Prior to Visit  Medication Sig  . aspirin 81 MG tablet Take 81 mg by mouth daily.  . Cholecalciferol (VITAMIN D) 2000 UNITS CAPS Take 1 tablet by mouth 2 (two) times daily.  . Omega-3 Fatty Acids (FISH OIL) 1000 MG CAPS Take 2 tablets by mouth daily.   No current facility-administered medications on file prior to visit.   Allergies  Allergen Reactions  . Nitrofurantoin   . Statins     Other reaction(s): Muscle Pain, Weakness Statins   Past Surgical History  Procedure Laterality Date  . Tubal ligation    . Breast surgery  2010    biopsy  . Breast biopsy Right 2010    benign  . Breast cyst aspiration Right    Social History   Social History  . Marital Status: Divorced    Spouse Name: N/A  . Number of Children: 2  . Years of Education: College   Occupational History  . [employer]     Full Time   Social History Main Topics  . Smoking status: Never Smoker   . Smokeless tobacco: Never  Used  . Alcohol Use: No  . Drug Use: No  . Sexual Activity: Not on file   Other Topics Concern  . Not on file   Social History Narrative   Family History  Problem Relation Age of Onset  . Congestive Heart Failure Mother   . Heart attack Mother   . Dementia Father   . Heart attack Father   . Prostate cancer Father   . Atrial fibrillation Sister   . Hyperlipidemia Sister   . Heart disease Brother         Objective:   Physical Exam  Constitutional: She appears well-developed and well-nourished.  Cardiovascular: Normal rate and normal heart sounds.   Pulmonary/Chest: Effort normal and breath sounds normal. No respiratory distress.  Abdominal: Soft. She exhibits no distension. There is no tenderness.  Psychiatric: She has a normal mood and affect. Her behavior is normal.  BP 136/70 mmHg  Pulse 68  Temp(Src) 97.5 F (36.4 C) (Oral)  Resp 16  Wt 180 lb (81.647 kg)  Diabetic Foot Exam - Simple   Simple Foot Form  Diabetic Foot exam was performed with the following findings:  Yes 11/20/2015 11:06 AM  Visual Inspection  No deformities, no ulcerations, no other skin breakdown bilaterally:  Yes  Sensation Testing  Intact to touch and monofilament testing bilaterally:  Yes  Pulse Check  Posterior Tibialis and Dorsalis pulse intact bilaterally:  Yes  Comments        Assessment & Plan:  1. Controlled type 2 diabetes mellitus without complication, without long-term current use of insulin (HCC) Stable. Continue current diet and exercise plan. - POCT glycosylated hemoglobin (Hb A1C) Results for orders placed or performed in visit on 11/20/15  POCT glycosylated hemoglobin (Hb A1C)  Result Value Ref Range   Hemoglobin A1C 7.0    Est. average glucose Bld gHb Est-mCnc 154     Diabetic Foot Exam - Simple   Simple Foot Form  Diabetic Foot exam was performed with the following findings:  Yes 11/20/2015 11:06 AM  Visual Inspection  No deformities, no ulcerations, no other skin breakdown bilaterally:  Yes  Sensation Testing  Intact to touch and monofilament testing bilaterally:  Yes  Pulse Check  Posterior Tibialis and Dorsalis pulse intact bilaterally:  Yes  Comments     2. Dysphagia New problem. Refer for endoscopy as below.  No weight loss or systemic symptoms.   - Ambulatory referral to Gastroenterology  3. Gastroesophageal reflux disease, esophagitis presence not specified New  problem. Has not had GI work up in the past. Will refer.   - Ambulatory referral to Gastroenterology   Patient seen and examined by Jerrell Belfast, MD, and note scribed by Renaldo Fiddler, CMA.  I have reviewed the document for accuracy and completeness and I agree with above. Jerrell Belfast, MD   Margarita Rana, MD

## 2015-11-21 ENCOUNTER — Other Ambulatory Visit: Payer: Self-pay | Admitting: Physical Medicine and Rehabilitation

## 2015-11-21 DIAGNOSIS — M5136 Other intervertebral disc degeneration, lumbar region: Secondary | ICD-10-CM

## 2015-11-23 DIAGNOSIS — G4733 Obstructive sleep apnea (adult) (pediatric): Secondary | ICD-10-CM | POA: Diagnosis not present

## 2015-12-12 ENCOUNTER — Ambulatory Visit: Payer: Medicare HMO

## 2015-12-18 ENCOUNTER — Ambulatory Visit (INDEPENDENT_AMBULATORY_CARE_PROVIDER_SITE_OTHER): Payer: Medicare HMO | Admitting: Gastroenterology

## 2015-12-18 ENCOUNTER — Encounter: Payer: Self-pay | Admitting: Gastroenterology

## 2015-12-18 ENCOUNTER — Other Ambulatory Visit: Payer: Self-pay

## 2015-12-18 VITALS — BP 143/64 | HR 72 | Temp 98.3°F | Ht 66.0 in | Wt 181.0 lb

## 2015-12-18 DIAGNOSIS — K219 Gastro-esophageal reflux disease without esophagitis: Secondary | ICD-10-CM

## 2015-12-18 DIAGNOSIS — R1013 Epigastric pain: Secondary | ICD-10-CM

## 2015-12-18 NOTE — Progress Notes (Signed)
Gastroenterology Consultation  Referring Provider:     Margarita Rana, MD Primary Care Physician:  Christine Rana, MD Primary Gastroenterologist:  Dr. Allen Norris     Reason for Consultation:     Dyspepsia        HPI:   Christine Richards is a 70 y.o. y/o female referred for consultation & management of Dyspepsia by Dr. Margarita Rana, MD.  This patient comes today with a history of heartburn many years ago when she was young but had been doing well after she states somebody at church prayed for her. The patient recently started to have abdominal distention with heartburn with different types of food and some episodes of mid chest pressure. The patient denies any dysphagia. She also denies any nausea vomiting fevers or chills. The patient states she is very active and works out a lot to try to keep her weight down but still has a large abdomen. There is no report of any black stools or bloody stools. Also denies any change in her bowel habits or unexplained weight loss. She reports that she has a lot of reactions to many medications she takes. She also reports that she does not take anything regularly except Xanax when she goes flying and a daily aspirin.  Past Medical History  Diagnosis Date  . Hyperlipidemia     Past Surgical History  Procedure Laterality Date  . Tubal ligation    . Breast surgery  2010    biopsy  . Breast biopsy Right 2010    benign  . Breast cyst aspiration Right     Prior to Admission medications   Medication Sig Start Date End Date Taking? Authorizing Provider  ALPRAZolam Duanne Moron) 0.5 MG tablet Take 1 tablet (0.5 mg total) by mouth 2 (two) times daily as needed for anxiety. 11/20/15  Yes Christine Rana, MD  aspirin 81 MG tablet Take 81 mg by mouth daily.   Yes Historical Provider, MD  Cholecalciferol (VITAMIN D) 2000 UNITS CAPS Take 1 tablet by mouth 2 (two) times daily. 07/05/14  Yes Historical Provider, MD  Omega-3 Fatty Acids (FISH OIL) 1000 MG CAPS Take 2 tablets by mouth  daily. 12/02/06  Yes Historical Provider, MD    Family History  Problem Relation Age of Onset  . Congestive Heart Failure Mother   . Heart attack Mother   . Dementia Father   . Heart attack Father   . Prostate cancer Father   . Atrial fibrillation Sister   . Hyperlipidemia Sister   . Heart disease Brother      Social History  Substance Use Topics  . Smoking status: Never Smoker   . Smokeless tobacco: Never Used  . Alcohol Use: No    Allergies as of 12/18/2015 - Review Complete 12/18/2015  Allergen Reaction Noted  . Nitrofurantoin  12/25/2014  . Statins  12/25/2014    Review of Systems:    All systems reviewed and negative except where noted in HPI.   Physical Exam:  BP 143/64 mmHg  Pulse 72  Temp(Src) 98.3 F (36.8 C) (Oral)  Ht 5\' 6"  (1.676 m)  Wt 181 lb (82.101 kg)  BMI 29.23 kg/m2 No LMP recorded. Patient is postmenopausal. Psych:  Alert and cooperative. Normal mood and affect. General:   Alert,  Well-developed, well-nourished, pleasant and cooperative in NAD Head:  Normocephalic and atraumatic. Eyes:  Sclera clear, no icterus.   Conjunctiva pink. Ears:  Normal auditory acuity. Nose:  No deformity, discharge, or lesions. Mouth:  No  deformity or lesions,oropharynx pink & moist. Neck:  Supple; no masses or thyromegaly. Lungs:  Respirations even and unlabored.  Clear throughout to auscultation.   No wheezes, crackles, or rhonchi. No acute distress. Heart:  Regular rate and rhythm; no murmurs, clicks, rubs, or gallops. Abdomen:  Normal bowel sounds.  No bruits.  Soft, non-tender and non-distended without masses, hepatosplenomegaly or hernias noted.  No guarding or rebound tenderness.  Negative Carnett sign.   Rectal:  Deferred.  Msk:  Symmetrical without gross deformities.  Good, equal movement & strength bilaterally. Pulses:  Normal pulses noted. Extremities:  No clubbing or edema.  No cyanosis. Neurologic:  Alert and oriented x3;  grossly normal  neurologically. Skin:  Intact without significant lesions or rashes.  No jaundice. Lymph Nodes:  No significant cervical adenopathy. Psych:  Alert and cooperative. Normal mood and affect.  Imaging Studies: No results found.  Assessment and Plan:   [name] is a 70 y.o. y/o female who comes in today with some intermittent heartburn with some mid chest pain when she eats. The patient has not had a worrisome symptoms such as unexplained weight loss black stools or bloody stools. The patient had a colonoscopy by Dr. Candace Cruise roughly 4 years ago that was reported to be normal. The patient will be set up for an upper endoscopy to look for any signs of Barrett's or esophageal damage due to her reflux.I have discussed risks & benefits which include, but are not limited to, bleeding, infection, perforation & drug reaction.  The patient agrees with this plan & written consent will be obtained.      Note: This dictation was prepared with Dragon dictation along with smaller phrase technology. Any transcriptional errors that result from this process are unintentional.

## 2015-12-19 ENCOUNTER — Other Ambulatory Visit: Payer: Self-pay

## 2015-12-23 ENCOUNTER — Ambulatory Visit: Payer: Medicare HMO

## 2015-12-24 DIAGNOSIS — G4733 Obstructive sleep apnea (adult) (pediatric): Secondary | ICD-10-CM | POA: Diagnosis not present

## 2015-12-26 NOTE — Discharge Instructions (Signed)

## 2015-12-29 ENCOUNTER — Ambulatory Visit: Payer: Medicare HMO | Admitting: Anesthesiology

## 2015-12-29 ENCOUNTER — Ambulatory Visit
Admission: RE | Admit: 2015-12-29 | Discharge: 2015-12-29 | Disposition: A | Discharge status: Home / Self Care | Payer: Medicare HMO | Source: Ambulatory Visit | Attending: Gastroenterology | Admitting: Gastroenterology

## 2015-12-29 ENCOUNTER — Encounter
Admission: RE | Disposition: A | Discharge status: Home / Self Care | Payer: Self-pay | Source: Ambulatory Visit | Attending: Gastroenterology

## 2015-12-29 DIAGNOSIS — M1990 Unspecified osteoarthritis, unspecified site: Secondary | ICD-10-CM | POA: Diagnosis not present

## 2015-12-29 DIAGNOSIS — K2970 Gastritis, unspecified, without bleeding: Secondary | ICD-10-CM | POA: Insufficient documentation

## 2015-12-29 DIAGNOSIS — Z8249 Family history of ischemic heart disease and other diseases of the circulatory system: Secondary | ICD-10-CM | POA: Insufficient documentation

## 2015-12-29 DIAGNOSIS — Z818 Family history of other mental and behavioral disorders: Secondary | ICD-10-CM | POA: Insufficient documentation

## 2015-12-29 DIAGNOSIS — Z8042 Family history of malignant neoplasm of prostate: Secondary | ICD-10-CM | POA: Diagnosis not present

## 2015-12-29 DIAGNOSIS — G4730 Sleep apnea, unspecified: Secondary | ICD-10-CM | POA: Insufficient documentation

## 2015-12-29 DIAGNOSIS — R12 Heartburn: Secondary | ICD-10-CM

## 2015-12-29 DIAGNOSIS — E785 Hyperlipidemia, unspecified: Secondary | ICD-10-CM | POA: Diagnosis not present

## 2015-12-29 DIAGNOSIS — Z8673 Personal history of transient ischemic attack (TIA), and cerebral infarction without residual deficits: Secondary | ICD-10-CM | POA: Insufficient documentation

## 2015-12-29 DIAGNOSIS — K449 Diaphragmatic hernia without obstruction or gangrene: Secondary | ICD-10-CM | POA: Diagnosis not present

## 2015-12-29 DIAGNOSIS — Z79899 Other long term (current) drug therapy: Secondary | ICD-10-CM | POA: Diagnosis not present

## 2015-12-29 DIAGNOSIS — K219 Gastro-esophageal reflux disease without esophagitis: Secondary | ICD-10-CM | POA: Insufficient documentation

## 2015-12-29 DIAGNOSIS — K2950 Unspecified chronic gastritis without bleeding: Secondary | ICD-10-CM | POA: Diagnosis not present

## 2015-12-29 DIAGNOSIS — K2930 Chronic superficial gastritis without bleeding: Secondary | ICD-10-CM | POA: Diagnosis not present

## 2015-12-29 DIAGNOSIS — Z7982 Long term (current) use of aspirin: Secondary | ICD-10-CM | POA: Insufficient documentation

## 2015-12-29 DIAGNOSIS — R1013 Epigastric pain: Secondary | ICD-10-CM | POA: Diagnosis not present

## 2015-12-29 HISTORY — DX: Unspecified osteoarthritis, unspecified site: M19.90

## 2015-12-29 HISTORY — DX: Cerebral infarction, unspecified: I63.9

## 2015-12-29 HISTORY — DX: Gastro-esophageal reflux disease without esophagitis: K21.9

## 2015-12-29 HISTORY — PX: ESOPHAGOGASTRODUODENOSCOPY (EGD) WITH PROPOFOL: SHX5813

## 2015-12-29 HISTORY — DX: Sleep apnea, unspecified: G47.30

## 2015-12-29 SURGERY — ESOPHAGOGASTRODUODENOSCOPY (EGD) WITH PROPOFOL
Anesthesia: Monitor Anesthesia Care | Site: Esophagus | Wound class: Clean Contaminated

## 2015-12-29 MED ORDER — PROPOFOL 10 MG/ML IV BOLUS
INTRAVENOUS | Status: DC | PRN
  Administered 2015-12-29 (×2): 20 mg via INTRAVENOUS
  Administered 2015-12-29: 60 mg via INTRAVENOUS

## 2015-12-29 MED ORDER — GLYCOPYRROLATE 0.2 MG/ML IJ SOLN
INTRAMUSCULAR | Status: DC | PRN
  Administered 2015-12-29: 0.2 mg via INTRAVENOUS

## 2015-12-29 MED ORDER — LACTATED RINGERS IV SOLN
INTRAVENOUS | Status: DC
  Administered 2015-12-29: 09:00:00 via INTRAVENOUS

## 2015-12-29 MED ORDER — SODIUM CHLORIDE 0.9 % IV SOLN: INTRAVENOUS | Status: DC

## 2015-12-29 MED ORDER — STERILE WATER FOR IRRIGATION IR SOLN
Status: DC | PRN
  Administered 2015-12-29: 10:00:00

## 2015-12-29 MED ORDER — ACETAMINOPHEN 325 MG PO TABS
325.0000 mg | ORAL_TABLET | ORAL | Status: DC | PRN
Start: 2015-12-29 — End: 2015-12-29

## 2015-12-29 MED ORDER — LIDOCAINE HCL (CARDIAC) 20 MG/ML IV SOLN
INTRAVENOUS | Status: DC | PRN
  Administered 2015-12-29: 40 mg via INTRAVENOUS

## 2015-12-29 MED ORDER — ACETAMINOPHEN 160 MG/5ML PO SOLN
325.0000 mg | ORAL | Status: DC | PRN
Start: 2015-12-29 — End: 2015-12-29

## 2015-12-29 SURGICAL SUPPLY — 31 items
BALLN DILATOR 10-12 8 (BALLOONS)
BALLN DILATOR 12-15 8 (BALLOONS)
BALLN DILATOR 15-18 8 (BALLOONS)
BALLN DILATOR CRE 0-12 8 (BALLOONS)
BALLN DILATOR ESOPH 8 10 CRE (MISCELLANEOUS) IMPLANT
BALLOON DILATOR 12-15 8 (BALLOONS) IMPLANT
BALLOON DILATOR 15-18 8 (BALLOONS) IMPLANT
BALLOON DILATOR CRE 0-12 8 (BALLOONS) IMPLANT
BLOCK BITE 60FR ADLT L/F GRN (MISCELLANEOUS) ×2 IMPLANT
CANISTER SUCT 1200ML W/VALVE (MISCELLANEOUS) ×2 IMPLANT
CLIP HMST 235XBRD CATH ROT (MISCELLANEOUS) IMPLANT
CLIP RESOLUTION 360 11X235 (MISCELLANEOUS)
FCP ESCP3.2XJMB 240X2.8X (MISCELLANEOUS)
FORCEPS BIOP RAD 4 LRG CAP 4 (CUTTING FORCEPS) ×1 IMPLANT
FORCEPS BIOP RJ4 240 W/NDL (MISCELLANEOUS)
FORCEPS ESCP3.2XJMB 240X2.8X (MISCELLANEOUS) IMPLANT
GOWN CVR UNV OPN BCK APRN NK (MISCELLANEOUS) ×2 IMPLANT
GOWN ISOL THUMB LOOP REG UNIV (MISCELLANEOUS) ×4
INJECTOR VARIJECT VIN23 (MISCELLANEOUS) IMPLANT
KIT DEFENDO VALVE AND CONN (KITS) IMPLANT
KIT ENDO PROCEDURE OLY (KITS) ×2 IMPLANT
MARKER SPOT ENDO TATTOO 5ML (MISCELLANEOUS) IMPLANT
PAD GROUND ADULT SPLIT (MISCELLANEOUS) IMPLANT
SNARE SHORT THROW 13M SML OVAL (MISCELLANEOUS) IMPLANT
SNARE SHORT THROW 30M LRG OVAL (MISCELLANEOUS) IMPLANT
SPOT EX ENDOSCOPIC TATTOO (MISCELLANEOUS)
SYR INFLATION 60ML (SYRINGE) IMPLANT
TRAP ETRAP POLY (MISCELLANEOUS) IMPLANT
VARIJECT INJECTOR VIN23 (MISCELLANEOUS)
WATER STERILE IRR 250ML POUR (IV SOLUTION) ×2 IMPLANT
WIRE CRE 18-20MM 8CM F G (MISCELLANEOUS) IMPLANT

## 2015-12-29 NOTE — H&P (Signed)
  [facility]  [address] [place], [place] [address] Phone: [phone] Fax : [phone]  Primary Care Physician:  Margarita Rana, MD Primary Gastroenterologist:  Dr. Allen Norris  Pre-Procedure History & Physical: HPI:  Christine Richards is a 70 y.o. female is here for an endoscopy.   Past Medical History  Diagnosis Date  . Hyperlipidemia   . GERD (gastroesophageal reflux disease)   . Sleep apnea     uses CPAP  . Stroke [facility])     TIA 6 years ago no issues since  . Arthritis     Past Surgical History  Procedure Laterality Date  . Tubal ligation    . Breast surgery  2010    biopsy  . Breast biopsy Right 2010    benign  . Breast cyst aspiration Right     Prior to Admission medications   Medication Sig Start Date End Date Taking? Authorizing Provider  aspirin 81 MG tablet Take 81 mg by mouth daily.   Yes Historical Provider, MD  Cholecalciferol (VITAMIN D) 2000 UNITS CAPS Take 1 tablet by mouth 2 (two) times daily. 07/05/14  Yes Historical Provider, MD  Omega-3 Fatty Acids (FISH OIL) 1000 MG CAPS Take 2 tablets by mouth daily. 12/02/06  Yes Historical Provider, MD  ALPRAZolam Duanne Moron) 0.5 MG tablet Take 1 tablet (0.5 mg total) by mouth 2 (two) times daily as needed for anxiety. 11/20/15   Margarita Rana, MD    Allergies as of 12/19/2015 - Review Complete 12/19/2015  Allergen Reaction Noted  . Nitrofurantoin  12/25/2014  . Statins  12/25/2014    Family History  Problem Relation Age of Onset  . Congestive Heart Failure Mother   . Heart attack Mother   . Dementia Father   . Heart attack Father   . Prostate cancer Father   . Atrial fibrillation Sister   . Hyperlipidemia Sister   . Heart disease Brother     Social History   Social History  . Marital Status: Divorced    Spouse Name: N/A  . Number of Children: 2  . Years of Education: College   Occupational History  . [employer]     Full Time   Social History Main Topics  .  Smoking status: Never Smoker   . Smokeless tobacco: Never Used  . Alcohol Use: No  . Drug Use: No  . Sexual Activity: Not on file   Other Topics Concern  . Not on file   Social History Narrative    Review of Systems: See HPI, otherwise negative ROS  Physical Exam: BP 126/74 mmHg  Pulse 66  Temp(Src) 97.5 F (36.4 C) (Temporal)  Resp 16  Ht 5\' 5"  (1.651 m)  Wt 177 lb (80.287 kg)  BMI 29.45 kg/m2  SpO2 94% General:   Alert,  pleasant and cooperative in NAD Head:  Normocephalic and atraumatic. Neck:  Supple; no masses or thyromegaly. Lungs:  Clear throughout to auscultation.    Heart:  Regular rate and rhythm. Abdomen:  Soft, nontender and nondistended. Normal bowel sounds, without guarding, and without rebound.   Neurologic:  Alert and  oriented x4;  grossly normal neurologically.  Impression/Plan: Christine Richards V is here for an endoscopy to be performed for GERD  Risks, benefits, limitations, and alternatives regarding  endoscopy have been reviewed with the patient.  Questions have been answered.  All parties agreeable.   Ollen Bowl, MD  12/29/2015, 8:47 AM

## 2015-12-29 NOTE — Anesthesia Postprocedure Evaluation (Signed)
Anesthesia Post Note  Patient: Milian Crumpton  Procedure(s) Performed: Procedure(s) (LRB): ESOPHAGOGASTRODUODENOSCOPY (EGD) WITH PROPOFOL (N/A)  Patient location during evaluation: PACU Anesthesia Type: MAC Level of consciousness: awake and alert and oriented Pain management: satisfactory to patient Vital Signs Assessment: post-procedure vital signs reviewed and stable Respiratory status: spontaneous breathing, nonlabored ventilation and respiratory function stable Cardiovascular status: blood pressure returned to baseline and stable Postop Assessment: Adequate PO intake and No signs of nausea or vomiting Anesthetic complications: no    Raliegh Ip

## 2015-12-29 NOTE — Anesthesia Preprocedure Evaluation (Signed)
Anesthesia Evaluation  Patient identified by MRN, date of birth, ID band  Reviewed: Allergy & Precautions, H&P , NPO status , Patient's Chart, lab work & pertinent test results  Airway Mallampati: II  TM Distance: <3 FB Neck ROM: full    Dental no notable dental hx.    Pulmonary sleep apnea ,    Pulmonary exam normal        Cardiovascular + Peripheral Vascular Disease   Rhythm:regular Rate:Normal     Neuro/Psych    GI/Hepatic GERD  ,  Endo/Other  diabetes  Renal/GU      Musculoskeletal   Abdominal   Peds  Hematology   Anesthesia Other Findings   Reproductive/Obstetrics                             Anesthesia Physical Anesthesia Plan  ASA: II  Anesthesia Plan: MAC   Post-op Pain Management:    Induction:   Airway Management Planned:   Additional Equipment:   Intra-op Plan:   Post-operative Plan:   Informed Consent: I have reviewed the patients History and Physical, chart, labs and discussed the procedure including the risks, benefits and alternatives for the proposed anesthesia with the patient or authorized representative who has indicated his/her understanding and acceptance.     Plan Discussed with: CRNA  Anesthesia Plan Comments:         Anesthesia Quick Evaluation

## 2015-12-29 NOTE — Transfer of Care (Signed)
Immediate Anesthesia Transfer of Care Note  Patient: Christine Richards  Procedure(s) Performed: Procedure(s) with comments: ESOPHAGOGASTRODUODENOSCOPY (EGD) WITH PROPOFOL (N/A) - GASTRI BIOPSY  Patient Location: PACU  Anesthesia Type: MAC  Level of Consciousness: awake, alert  and patient cooperative  Airway and Oxygen Therapy: Patient Spontanous Breathing and Patient connected to supplemental oxygen  Post-op Assessment: Post-op Vital signs reviewed, Patient's Cardiovascular Status Stable, Respiratory Function Stable, Patent Airway and No signs of Nausea or vomiting  Post-op Vital Signs: Reviewed and stable  Complications: No apparent anesthesia complications

## 2015-12-29 NOTE — Op Note (Signed)
[facility] Gastroenterology Patient Name: Christine Richards Procedure Date: 12/29/2015 9:38 AM MRN: [id number] Account #: [id number] Date of Birth: 04-Nov-1945 Admit Type: Outpatient Age: 70 Room: [facility] OR ROOM 01 Gender: Female Note Status: Finalized Procedure:            Upper GI endoscopy Indications:          Heartburn Providers:            Lucilla Lame, MD Referring MD:         Jerrell Belfast, MD (Referring MD) Medicines:            Propofol per Anesthesia Complications:        No immediate complications. Procedure:            Pre-Anesthesia Assessment:                       - Prior to the procedure, a History and Physical was                        performed, and patient medications and allergies were                        reviewed. The patient's tolerance of previous                        anesthesia was also reviewed. The risks and benefits of                        the procedure and the sedation options and risks were                        discussed with the patient. All questions were                        answered, and informed consent was obtained. Prior                        Anticoagulants: The patient has taken no previous                        anticoagulant or antiplatelet agents. ASA Grade                        Assessment: II - A patient with mild systemic disease.                        After reviewing the risks and benefits, the patient was                        deemed in satisfactory condition to undergo the                        procedure.                       After obtaining informed consent, the endoscope was                        passed under direct vision. Throughout the procedure,  the patient's blood pressure, pulse, and oxygen                        saturations were monitored continuously. The was                        introduced through the mouth, and advanced to the                        second part of  duodenum. The upper GI endoscopy was                        accomplished without difficulty. The patient tolerated                        the procedure well. Findings:      A small hiatal hernia was present.      Localized mild inflammation characterized by erythema was found in the       gastric antrum. Biopsies were taken with a cold forceps for histology.      The examined duodenum was normal. Impression:           - Small hiatal hernia.                       - Gastritis. Biopsied.                       - Normal examined duodenum. Recommendation:       - Await pathology results. Procedure Code(s):    --- Professional ---                       [phone], Esophagogastroduodenoscopy, flexible, transoral;                        with biopsy, single or multiple Diagnosis Code(s):    --- Professional ---                       R12, Heartburn                       K29.70, Gastritis, unspecified, without bleeding CPT copyright 2016 American Medical Association. All rights reserved. The codes documented in this report are preliminary and upon coder review may  be revised to meet current compliance requirements. Lucilla Lame, MD 12/29/2015 9:53:03 AM This report has been signed electronically. Number of Addenda: 0 Note Initiated On: 12/29/2015 9:38 AM      [facility]

## 2015-12-30 ENCOUNTER — Encounter: Payer: Self-pay | Admitting: Gastroenterology

## 2016-01-01 ENCOUNTER — Encounter: Payer: Self-pay | Admitting: Gastroenterology

## 2016-01-04 ENCOUNTER — Encounter: Payer: Self-pay | Admitting: Gastroenterology

## 2016-01-23 DIAGNOSIS — G4733 Obstructive sleep apnea (adult) (pediatric): Secondary | ICD-10-CM | POA: Diagnosis not present

## 2016-02-23 DIAGNOSIS — G4733 Obstructive sleep apnea (adult) (pediatric): Secondary | ICD-10-CM | POA: Diagnosis not present

## 2016-03-24 DIAGNOSIS — G4733 Obstructive sleep apnea (adult) (pediatric): Secondary | ICD-10-CM | POA: Diagnosis not present

## 2016-04-24 DIAGNOSIS — G4733 Obstructive sleep apnea (adult) (pediatric): Secondary | ICD-10-CM | POA: Diagnosis not present

## 2016-05-24 ENCOUNTER — Ambulatory Visit: Payer: Self-pay | Admitting: Physician Assistant

## 2016-05-25 DIAGNOSIS — G4733 Obstructive sleep apnea (adult) (pediatric): Secondary | ICD-10-CM | POA: Diagnosis not present

## 2016-05-31 ENCOUNTER — Ambulatory Visit (INDEPENDENT_AMBULATORY_CARE_PROVIDER_SITE_OTHER): Payer: Medicare HMO | Admitting: Physician Assistant

## 2016-05-31 ENCOUNTER — Encounter: Payer: Self-pay | Admitting: Physician Assistant

## 2016-05-31 VITALS — BP 110/60 | HR 72 | Temp 97.8°F | Resp 16 | Ht 66.0 in | Wt 180.0 lb

## 2016-05-31 DIAGNOSIS — E119 Type 2 diabetes mellitus without complications: Secondary | ICD-10-CM

## 2016-05-31 LAB — POCT GLYCOSYLATED HEMOGLOBIN (HGB A1C)
Est. average glucose Bld gHb Est-mCnc: 180
HEMOGLOBIN A1C: 7.9

## 2016-05-31 NOTE — Progress Notes (Signed)
Patient: Christine Richards. Female    DOB: Mar 04, 1946   70 y.o.   MRN: [id number] Visit Date: 05/31/2016  Today's Provider: Mar Daring, PA-C   Chief Complaint  Patient presents with  . Diabetes   Subjective:    HPI  Diabetes Mellitus Type II, Follow-up:   Lab Results  Component Value Date   HGBA1C 7.9 05/31/2016   HGBA1C 7.0 11/20/2015   HGBA1C 7.2 (H) 08/14/2015    Last seen for diabetes 6 months ago.  Management since then includes check A1c. She reports excellent compliance with treatment. She is not having side effects. Current symptoms include none and have been stable. Home blood sugar records: fasting range: not being checked  Episodes of hypoglycemia? no   Current Insulin Regimen: Most Recent Eye Exam: [facility] Feb or Mar of 2017 Weight trend: stable Prior visit with dietician: no Current diet: in general, a "healthy" diet   Current exercise: cardiovascular workout on exercise equipment and walking  Pertinent Labs:    Component Value Date/Time   CHOL 338 (H) 08/14/2015 0835   TRIG 297 (H) 08/14/2015 0835   HDL 47 08/14/2015 0835   LDLCALC 232 (H) 08/14/2015 0835   CREATININE 0.65 08/14/2015 0835   CREATININE 0.72 03/17/2013 1443    Wt Readings from Last 3 Encounters:  05/31/16 180 lb (81.6 kg)  12/29/15 177 lb (80.3 kg)  12/18/15 181 lb (82.1 kg)    ------------------------------------------------------------------------    Allergies  Allergen Reactions  . Nitrofurantoin   . Statins     Other reaction(s): Muscle Pain, Weakness Statins     Current Outpatient Prescriptions:  .  Cholecalciferol (VITAMIN D) 2000 UNITS CAPS, Take 1 tablet by mouth 2 (two) times daily., Disp: , Rfl:  .  Omega-3 Fatty Acids (FISH OIL) 1000 MG CAPS, Take 2 tablets by mouth daily., Disp: , Rfl:  .  ALPRAZolam (XANAX) 0.5 MG tablet, Take 1 tablet (0.5 mg total) by mouth 2 (two) times daily as needed for anxiety. (Patient not taking: Reported on 05/31/2016),  Disp: 14 tablet, Rfl: 0  Review of Systems  Constitutional: Negative.   Respiratory: Negative.   Cardiovascular: Negative.   Gastrointestinal: Negative.   Endocrine: Negative.   Genitourinary: Negative.   Neurological: Negative.     Social History  Substance Use Topics  . Smoking status: Never Smoker  . Smokeless tobacco: Never Used  . Alcohol use No   Objective:   BP 110/60 (BP Location: Left Arm, Patient Position: Sitting, Cuff Size: Large)   Pulse 72   Temp 97.8 F (36.6 C) (Oral)   Resp 16   Ht 5\' 6"  (1.676 m)   Wt 180 lb (81.6 kg)   BMI 29.05 kg/m   Physical Exam  Constitutional: She appears well-developed and well-nourished. No distress.  Neck: Normal range of motion. Neck supple. No JVD present. No tracheal deviation present. No thyromegaly present.  Cardiovascular: Normal rate, regular rhythm, normal heart sounds and intact distal pulses.  Exam reveals no gallop and no friction rub.   No murmur heard. Pulmonary/Chest: Effort normal and breath sounds normal. No respiratory distress. She has no wheezes. She has no rales.  Musculoskeletal: She exhibits no edema.  Lymphadenopathy:    She has no cervical adenopathy.  Skin: She is not diaphoretic.  Psychiatric: She has a normal mood and affect. Her behavior is normal. Judgment and thought content normal.  Vitals reviewed.     Assessment & Plan:  1. Controlled type 2 diabetes mellitus without complication, without long-term current use of insulin ([place]) HgBA1c is increased to 7.9. She still does not wish to start medication at this time. Discussed risk associated with not controlling diabetes. She agrees to work harder on dieting, as she does admit to eating a lot of fruit and ice cream over the summer.  Continue exercise and diet. Will recheck HgBA1c in 3 months. She will also be due for all her other labs for hypercholesterolemia and thyroid check at that time as well.  - POCT glycosylated hemoglobin (Hb A1C)         Mar Daring, PA-C  [facility]

## 2016-05-31 NOTE — Patient Instructions (Signed)

## 2016-06-01 MED ORDER — ASPIRIN EC 81 MG PO TBEC: 81.0000 mg | DELAYED_RELEASE_TABLET | Freq: Every day | ORAL | 0 refills | Status: DC

## 2016-06-03 DIAGNOSIS — L578 Other skin changes due to chronic exposure to nonionizing radiation: Secondary | ICD-10-CM | POA: Diagnosis not present

## 2016-06-03 DIAGNOSIS — L918 Other hypertrophic disorders of the skin: Secondary | ICD-10-CM | POA: Diagnosis not present

## 2016-06-03 DIAGNOSIS — L821 Other seborrheic keratosis: Secondary | ICD-10-CM | POA: Diagnosis not present

## 2016-06-03 DIAGNOSIS — L812 Freckles: Secondary | ICD-10-CM | POA: Diagnosis not present

## 2016-06-03 DIAGNOSIS — Z1283 Encounter for screening for malignant neoplasm of skin: Secondary | ICD-10-CM | POA: Diagnosis not present

## 2016-06-03 DIAGNOSIS — L820 Inflamed seborrheic keratosis: Secondary | ICD-10-CM | POA: Diagnosis not present

## 2016-06-03 DIAGNOSIS — D1800 Hemangioma unspecified site: Secondary | ICD-10-CM | POA: Diagnosis not present

## 2016-06-03 DIAGNOSIS — L570 Actinic keratosis: Secondary | ICD-10-CM | POA: Diagnosis not present

## 2016-06-03 DIAGNOSIS — D229 Melanocytic nevi, unspecified: Secondary | ICD-10-CM | POA: Diagnosis not present

## 2016-06-24 DIAGNOSIS — G4733 Obstructive sleep apnea (adult) (pediatric): Secondary | ICD-10-CM | POA: Diagnosis not present

## 2016-08-06 ENCOUNTER — Ambulatory Visit: Payer: Self-pay | Admitting: Physician Assistant

## 2016-08-13 ENCOUNTER — Ambulatory Visit (INDEPENDENT_AMBULATORY_CARE_PROVIDER_SITE_OTHER): Payer: Medicare HMO | Admitting: Physician Assistant

## 2016-08-13 ENCOUNTER — Encounter: Payer: Self-pay | Admitting: Physician Assistant

## 2016-08-13 VITALS — BP 120/74 | HR 60 | Temp 97.7°F | Resp 16 | Wt 172.0 lb

## 2016-08-13 DIAGNOSIS — E119 Type 2 diabetes mellitus without complications: Secondary | ICD-10-CM

## 2016-08-13 DIAGNOSIS — L84 Corns and callosities: Secondary | ICD-10-CM

## 2016-08-13 NOTE — Patient Instructions (Signed)
Corns and Calluses Introduction Corns are small areas of thickened skin that occur on the top, sides, or tip of a toe. They contain a cone-shaped core with a point that can press on a nerve below. This causes pain. Calluses are areas of thickened skin that can occur anywhere on the body including hands, fingers, palms, soles of the feet, and heels.Calluses are usually larger than corns. What are the causes? Corns and calluses are caused by rubbing (friction) or pressure, such as from shoes that are too tight or do not fit properly. What increases the risk? Corns are more likely to develop in people who have toe deformities, such as hammer toes. Since calluses can occur with friction to any area of the skin, calluses are more likely to develop in people who:  Work with their hands.  Wear shoes that fit poorly, shoes that are too tight, or shoes that are high-heeled.  Have toes deformities. What are the signs or symptoms? Symptoms of a corn or callus include:  A hard growth on the skin.  Pain or tenderness under the skin.  Redness and swelling.  Increased discomfort while wearing tight-fitting shoes. How is this diagnosed? Corns and calluses may be diagnosed with a medical history and physical exam. How is this treated? Corns and calluses may be treated with:  Removing the cause of the friction or pressure. This may include:  Changing your shoes.  Wearing shoe inserts (orthotics) or other protective layers in your shoes, such as a corn pad.  Wearing gloves.  Medicines to help soften skin in the hardened, thickened areas.  Reducing the size of the corn or callus by removing the dead layers of skin.  Antibiotic medicines to treat infection.  Surgery, if a toe deformity is the cause. Follow these instructions at home:  Take medicines only as directed by your health care provider.  If you were prescribed an antibiotic, finish all of it even if you start to feel  better.  Wear shoes that fit well. Avoid wearing high-heeled shoes and shoes that are too tight or too loose.  Wear any padding, protective layers, gloves, or orthotics as directed by your health care provider.  Soak your hands or feet and then use a file or pumice stone to soften your corn or callus. Do this as directed by your health care provider.  Check your corn or callus every day for signs of infection. Watch for:  Redness, swelling, or pain.  Fluid, blood, or pus. Contact a health care provider if:  Your symptoms do not improve with treatment.  You have increased redness, swelling, or pain at the site of your corn or callus.  You have fluid, blood, or pus coming from your corn or callus.  You have new symptoms. This information is not intended to replace advice given to you by your health care provider. Make sure you discuss any questions you have with your health care provider. Document Released: 05/22/2004 Document Revised: 03/05/2016 Document Reviewed: 08/12/2014  2017 Elsevier

## 2016-08-13 NOTE — Progress Notes (Signed)
       Patient: Christine Richards. Female    DOB: 1946-02-26   70 y.o.   MRN: [id number] Visit Date: 08/13/2016  Today's Provider: Mar Daring, PA-C   Chief Complaint  Patient presents with  . Toe Pain   Subjective:    Toe Pain   There was no injury mechanism. The pain is present in the left toes (2nd toe). The quality of the pain is described as burning. The pain is at a severity of 2/10. The pain is mild. The pain has been constant since onset. Pertinent negatives include no inability to bear weight, loss of motion, loss of sensation, numbness or tingling. Exacerbated by: friction. She has tried nothing for the symptoms.   Pt reports the pain has been occurring about 2 months, and is worsening. "It feels like rubbing a blister". Pt is concerned because she is diabetic.    Allergies  Allergen Reactions  . Nitrofurantoin   . Statins     Other reaction(s): Muscle Pain, Weakness Statins     Current Outpatient Prescriptions:  .  Cholecalciferol (VITAMIN D) 2000 UNITS CAPS, Take 1 tablet by mouth 2 (two) times daily., Disp: , Rfl:  .  Omega-3 Fatty Acids (FISH OIL) 1000 MG CAPS, Take 2 tablets by mouth daily., Disp: , Rfl:  .  ALPRAZolam (XANAX) 0.5 MG tablet, Take 1 tablet (0.5 mg total) by mouth 2 (two) times daily as needed for anxiety. (Patient not taking: Reported on 08/13/2016), Disp: 14 tablet, Rfl: 0  Review of Systems  Constitutional: Negative.   Respiratory: Negative.   Cardiovascular: Negative.   Musculoskeletal: Negative.   Skin: Negative.   Neurological: Negative for tingling, weakness and numbness.    Social History  Substance Use Topics  . Smoking status: Never Smoker  . Smokeless tobacco: Never Used  . Alcohol use No   Objective:   BP 120/74 (BP Location: Right Arm, Patient Position: Sitting, Cuff Size: Normal)   Pulse 60   Temp 97.7 F (36.5 C) (Oral)   Resp 16   Wt 172 lb (78 kg)   BMI 27.76 kg/m   Physical Exam  Constitutional: She appears  well-developed and well-nourished. No distress.  Cardiovascular: Normal rate, regular rhythm and normal heart sounds.  Exam reveals no gallop and no friction rub.   No murmur heard. Pulmonary/Chest: Effort normal and breath sounds normal. No respiratory distress. She has no wheezes. She has no rales.  Musculoskeletal:       Feet:  Skin: She is not diaphoretic.  Vitals reviewed.       Assessment & Plan:     1. Corn of toe Noted on left second toe. Will refer to podiatry for consideration of treatment being that she is diabetic. Advised for patient to use a pad over the area to protect it for now and try to wear shoes that have a wide toe box. - Ambulatory referral to Podiatry  2. Type 2 diabetes mellitus without complication, without long-term current use of insulin ([place]) See above medical treatment plan. - Ambulatory referral to Podiatry     Patient seen and examined by Mar Daring, PA-C, and note scribed by Renaldo Fiddler, CMA.   This office note has been dictated so please excuse any grammatical or typographical errors.  Mar Daring, PA-C  [facility]

## 2016-08-27 ENCOUNTER — Ambulatory Visit (INDEPENDENT_AMBULATORY_CARE_PROVIDER_SITE_OTHER): Payer: Medicare HMO | Admitting: Podiatry

## 2016-08-27 ENCOUNTER — Encounter: Payer: Self-pay | Admitting: Podiatry

## 2016-08-27 DIAGNOSIS — L851 Acquired keratosis [keratoderma] palmaris et plantaris: Secondary | ICD-10-CM

## 2016-08-27 DIAGNOSIS — L84 Corns and callosities: Secondary | ICD-10-CM | POA: Diagnosis not present

## 2016-08-27 DIAGNOSIS — M79672 Pain in left foot: Secondary | ICD-10-CM

## 2016-08-27 DIAGNOSIS — M79671 Pain in right foot: Secondary | ICD-10-CM | POA: Diagnosis not present

## 2016-08-27 NOTE — Progress Notes (Signed)
Subjective: Patient presents to the office today for chief complaint of painful callus lesions of the feet. Patient states that the pain is ongoing and is affecting their ability to ambulate without pain. Patient states that she has noticed the corn for approximately 2-3 months now. Patient presents today for further treatment and evaluation.  Objective:  Physical Exam General: Alert and oriented x3 in no acute distress  Dermatology: Hyperkeratotic lesion present on the medial aspect of the second digit left foot. Pain on palpation with a central nucleated core noted.  Skin is warm, dry and supple bilateral lower extremities. Negative for open lesions or macerations.  Vascular: Palpable pedal pulses bilaterally. No edema or erythema noted. Capillary refill within normal limits.  Neurological: Epicritic and protective threshold grossly intact bilaterally.   Musculoskeletal Exam: Pain on palpation at the keratotic lesion noted. Range of motion within normal limits bilateral. Muscle strength 5/5 in all groups bilateral. 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  #3 silicone toe cap dispensed #4 Patient is to return to the clinic PRN.   Edrick Kins, [place]

## 2016-09-10 ENCOUNTER — Ambulatory Visit (INDEPENDENT_AMBULATORY_CARE_PROVIDER_SITE_OTHER): Payer: Medicare HMO | Admitting: Physician Assistant

## 2016-09-10 ENCOUNTER — Encounter: Payer: Self-pay | Admitting: Physician Assistant

## 2016-09-10 VITALS — BP 124/74 | HR 72 | Temp 97.9°F | Resp 16 | Wt 172.0 lb

## 2016-09-10 DIAGNOSIS — E039 Hypothyroidism, unspecified: Secondary | ICD-10-CM

## 2016-09-10 DIAGNOSIS — E7800 Pure hypercholesterolemia, unspecified: Secondary | ICD-10-CM | POA: Diagnosis not present

## 2016-09-10 DIAGNOSIS — E119 Type 2 diabetes mellitus without complications: Secondary | ICD-10-CM | POA: Diagnosis not present

## 2016-09-10 NOTE — Patient Instructions (Signed)

## 2016-09-10 NOTE — Progress Notes (Signed)
Patient: Christine Richards. Female    DOB: 1945/10/08   71 y.o.   MRN: [id number] Visit Date: 09/10/2016  Today's Provider: Mar Daring, PA-C   Chief Complaint  Patient presents with  . Diabetes  . Hypothyroidism  . Hyperlipidemia   Subjective:    HPI     Diabetes Mellitus Type II, Follow-up:   Lab Results  Component Value Date   HGBA1C 7.9 05/31/2016   HGBA1C 7.0 11/20/2015   HGBA1C 7.2 (H) 08/14/2015    Last seen for diabetes 3 months ago.  Management since then includes none. Pt's A1C was elevated, but refused medication. Pt was to work on healthy diet and lifestyle changes. She reports excellent compliance with treatment. Pt reports she has given up meat, but has eaten deserts and cake over the holidays and her birthday. Current symptoms include visual disturbances and have been unchanged. Pt sees floaters, which she attributes to migraines. Home blood sugar records: not being checked  Episodes of hypoglycemia? no   Current Insulin Regimen: N/A Weight trend: decreasing steadily Current diet: vegetarian Current exercise: 4 days a week at the gym for 1 hour  Pertinent Labs:    Component Value Date/Time   CHOL 338 (H) 08/14/2015 0835   TRIG 297 (H) 08/14/2015 0835   HDL 47 08/14/2015 0835   LDLCALC 232 (H) 08/14/2015 0835   CREATININE 0.65 08/14/2015 0835   CREATININE 0.72 03/17/2013 1443    Wt Readings from Last 3 Encounters:  09/10/16 172 lb (78 kg)  08/13/16 172 lb (78 kg)  05/31/16 180 lb (81.6 kg)    ------------------------------------------------------------------------  Hypothyroid, follow-up:  TSH  Date Value Ref Range Status  08/14/2015 3.430 0.450 - 4.500 uIU/mL Final  11/06/2014 2.94 0.41 - 5.90 uIU/mL Final   Wt Readings from Last 3 Encounters:  09/10/16 172 lb (78 kg)  08/13/16 172 lb (78 kg)  05/31/16 180 lb (81.6 kg)    She was last seen for hypothyroid 1 years ago.  Management since that visit includes check  labs. She is exercising. She is experiencing none She denies change in energy level, heat / cold intolerance, nervousness and palpitations Weight trend: decreasing steadily  ------------------------------------------------------------------------  Lipid/Cholesterol, Follow-up:   Last seen for this1 years ago.  Management changes since that visit include none. Labs were checked, no changes made. . Last Lipid Panel:    Component Value Date/Time   CHOL 338 (H) 08/14/2015 0835   TRIG 297 (H) 08/14/2015 0835   HDL 47 08/14/2015 0835   CHOLHDL 7.2 (H) 08/14/2015 0835   LDLCALC 232 (H) 08/14/2015 [id number]   -------------------------------------------------------------------   Allergies  Allergen Reactions  . Nitrofurantoin   . Statins     Other reaction(s): Muscle Pain, Weakness Statins     Current Outpatient Prescriptions:  .  Cholecalciferol (VITAMIN D) 2000 UNITS CAPS, Take 1 tablet by mouth 2 (two) times daily., Disp: , Rfl:  .  Omega-3 Fatty Acids (FISH OIL) 1000 MG CAPS, Take 2 tablets by mouth daily., Disp: , Rfl:  .  ALPRAZolam (XANAX) 0.5 MG tablet, Take 1 tablet (0.5 mg total) by mouth 2 (two) times daily as needed for anxiety. (Patient not taking: Reported on 09/10/2016), Disp: 14 tablet, Rfl: 0  Review of Systems  Constitutional: Negative for activity change, appetite change, chills, diaphoresis, fatigue, fever and unexpected weight change.  Respiratory: Negative for cough, chest tightness and shortness of breath.   Cardiovascular: Negative for chest pain, palpitations and leg  swelling.  Gastrointestinal: Negative for abdominal pain.  Endocrine: Negative for polydipsia, polyphagia and polyuria.  Neurological: Negative for dizziness, weakness, numbness and headaches.    Social History  Substance Use Topics  . Smoking status: Never Smoker  . Smokeless tobacco: Never Used  . Alcohol use No   Objective:   BP 124/74 (BP Location: Right Arm, Patient Position: Sitting,  Cuff Size: Normal)   Pulse 72   Temp 97.9 F (36.6 C) (Oral)   Resp 16   Wt 172 lb (78 kg)   BMI 27.76 kg/m   Physical Exam  Constitutional: She appears well-developed and well-nourished. No distress.  Neck: Normal range of motion. Neck supple.  Cardiovascular: Normal rate, regular rhythm and normal heart sounds.  Exam reveals no gallop and no friction rub.   No murmur heard. Pulmonary/Chest: Effort normal and breath sounds normal. No respiratory distress. She has no wheezes. She has no rales.  Musculoskeletal: She exhibits no edema.  Skin: She is not diaphoretic.  Vitals reviewed.      Assessment & Plan:     1. Controlled type 2 diabetes mellitus without complication, without long-term current use of insulin ([facility]) Stable. Diet controlled currently. Will check labs as below and f/u pending these results. I will see her back in 3 months to recheck A1c.  - Comprehensive metabolic panel - HgB [id number] - CBC w/Diff/Platelet  2. Hypercholesteremia Previously stable. Patient adheres to a healthy lifestyle and takes fish oil 1000mg  BID. Will check labs as below and f/u pending results. - Comprehensive metabolic panel - Lipid panel; Future - CBC w/Diff/Platelet  3. Acquired hypothyroidism H/O this but not on levothyroxine. Will check labs as below and f/u pending results. - TSH - CBC w/Diff/Platelet     Patient seen and examined by Mar Daring, PA-C, and note scribed by Renaldo Fiddler, CMA.  Mar Daring, PA-C  [facility]

## 2016-09-14 DIAGNOSIS — H2513 Age-related nuclear cataract, bilateral: Secondary | ICD-10-CM | POA: Diagnosis not present

## 2016-09-14 DIAGNOSIS — E039 Hypothyroidism, unspecified: Secondary | ICD-10-CM | POA: Diagnosis not present

## 2016-09-14 DIAGNOSIS — E7800 Pure hypercholesterolemia, unspecified: Secondary | ICD-10-CM | POA: Diagnosis not present

## 2016-09-14 DIAGNOSIS — E119 Type 2 diabetes mellitus without complications: Secondary | ICD-10-CM | POA: Diagnosis not present

## 2016-09-15 LAB — CBC WITH DIFFERENTIAL/PLATELET
BASOS: 0 %
Basophils Absolute: 0 10*3/uL (ref 0.0–0.2)
EOS (ABSOLUTE): 0.1 10*3/uL (ref 0.0–0.4)
Eos: 2 %
HEMATOCRIT: 43.1 % (ref 34.0–46.6)
Hemoglobin: 14.1 g/dL (ref 11.1–15.9)
IMMATURE GRANS (ABS): 0 10*3/uL (ref 0.0–0.1)
Immature Granulocytes: 0 %
LYMPHS: 28 %
Lymphocytes Absolute: 1.3 10*3/uL (ref 0.7–3.1)
MCH: 31.9 pg (ref 26.6–33.0)
MCHC: 32.7 g/dL (ref 31.5–35.7)
MCV: 98 fL — AB (ref 79–97)
MONOS ABS: 0.4 10*3/uL (ref 0.1–0.9)
Monocytes: 9 %
NEUTROS ABS: 2.7 10*3/uL (ref 1.4–7.0)
Neutrophils: 61 %
PLATELETS: 234 10*3/uL (ref 150–379)
RBC: 4.42 x10E6/uL (ref 3.77–5.28)
RDW: 13.5 % (ref 12.3–15.4)
WBC: 4.5 10*3/uL (ref 3.4–10.8)

## 2016-09-15 LAB — COMPREHENSIVE METABOLIC PANEL
A/G RATIO: 1.9 (ref 1.2–2.2)
ALK PHOS: 106 IU/L (ref 39–117)
ALT: 25 IU/L (ref 0–32)
AST: 21 IU/L (ref 0–40)
Albumin: 4.4 g/dL (ref 3.5–4.8)
BUN/Creatinine Ratio: 25 (ref 12–28)
BUN: 19 mg/dL (ref 8–27)
Bilirubin Total: 0.4 mg/dL (ref 0.0–1.2)
CO2: 26 mmol/L (ref 18–29)
Calcium: 9.3 mg/dL (ref 8.7–10.3)
Chloride: 99 mmol/L (ref 96–106)
Creatinine, Ser: 0.75 mg/dL (ref 0.57–1.00)
GFR calc Af Amer: 93 mL/min/{1.73_m2} (ref >59)
GFR calc non Af Amer: 80 mL/min/{1.73_m2} (ref >59)
GLOBULIN, TOTAL: 2.3 g/dL (ref 1.5–4.5)
Glucose: 162 mg/dL — ABNORMAL HIGH (ref 65–99)
POTASSIUM: 4.4 mmol/L (ref 3.5–5.2)
SODIUM: 141 mmol/L (ref 134–144)
Total Protein: 6.7 g/dL (ref 6.0–8.5)

## 2016-09-15 LAB — HEMOGLOBIN A1C
Est. average glucose Bld gHb Est-mCnc: 169 mg/dL
HEMOGLOBIN A1C: 7.5 % — AB (ref 4.8–5.6)

## 2016-09-15 LAB — TSH: TSH: 3.04 u[IU]/mL (ref 0.450–4.500)

## 2016-09-17 ENCOUNTER — Telehealth: Payer: Self-pay

## 2016-09-17 NOTE — Telephone Encounter (Signed)
-----   Message from Mar Daring, PA-C sent at 09/17/2016 10:26 AM EST ----- Labs are stable. HgBA1c improved to 7.5 from 7.9. Continue lifestyle modifications.

## 2016-09-17 NOTE — Telephone Encounter (Signed)
Patient advised as directed below.  Thanks,  -Joseline 

## 2016-09-20 ENCOUNTER — Telehealth: Payer: Self-pay

## 2016-09-20 DIAGNOSIS — E782 Mixed hyperlipidemia: Secondary | ICD-10-CM

## 2016-09-20 MED ORDER — FENOFIBRATE 145 MG PO TABS: 145.0000 mg | ORAL_TABLET | Freq: Every day | ORAL | 3 refills | Status: DC

## 2016-09-20 NOTE — Telephone Encounter (Signed)
Called in. Christine Richards, CMA  

## 2016-09-20 NOTE — Telephone Encounter (Signed)
Pt advised. Pt does agree to try medication , but only short term. Pt states she is more concerned of long term effects of medications than she is of her cholesterol. [facility]. Renaldo Fiddler, CMA

## 2016-09-20 NOTE — Telephone Encounter (Signed)
E Rx would not work. Please call in to [facility]: Fenofibrate 145mg  tab Take one tab PO daily #30 3RF.  Thanks.

## 2016-09-20 NOTE — Telephone Encounter (Signed)
-----   Message from Mar Daring, PA-C sent at 09/20/2016  8:50 AM EST ----- Cholesterol is fairly stable from labs done last year. Triglycerides have improved. 10-yr ASCVD risk is still quite elevated at 21%, anything below 7.5% is considered normal or lower risk. Would consider adding fenofibrate since she is intolerant to statins if patient is agreeable. Continue fish oil, may can add red yeast rice if she wants more natural changes. Continue healthy lifestyle modifications.

## 2016-10-11 LAB — LIPID PANEL W/O CHOL/HDL RATIO
CHOLESTEROL TOTAL: 332 mg/dL — AB (ref 100–199)
HDL: 46 mg/dL (ref >39)
LDL Calculated: 242 mg/dL — ABNORMAL HIGH (ref 0–99)
Triglycerides: 220 mg/dL — ABNORMAL HIGH (ref 0–149)
VLDL CHOLESTEROL CAL: 44 mg/dL — AB (ref 5–40)

## 2016-10-11 LAB — SPECIMEN STATUS REPORT

## 2016-10-26 DIAGNOSIS — H2513 Age-related nuclear cataract, bilateral: Secondary | ICD-10-CM | POA: Diagnosis not present

## 2016-10-27 ENCOUNTER — Encounter: Payer: Self-pay | Admitting: *Deleted

## 2016-11-11 ENCOUNTER — Encounter: Payer: Self-pay | Admitting: *Deleted

## 2016-11-11 ENCOUNTER — Ambulatory Visit: Payer: Medicare HMO | Admitting: Anesthesiology

## 2016-11-11 ENCOUNTER — Encounter
Admission: RE | Disposition: A | Discharge status: Home / Self Care | Payer: Self-pay | Source: Ambulatory Visit | Attending: Ophthalmology

## 2016-11-11 ENCOUNTER — Ambulatory Visit
Admission: RE | Admit: 2016-11-11 | Discharge: 2016-11-11 | Disposition: A | Discharge status: Home / Self Care | Payer: Medicare HMO | Source: Ambulatory Visit | Attending: Ophthalmology | Admitting: Ophthalmology

## 2016-11-11 DIAGNOSIS — I10 Essential (primary) hypertension: Secondary | ICD-10-CM | POA: Diagnosis not present

## 2016-11-11 DIAGNOSIS — E039 Hypothyroidism, unspecified: Secondary | ICD-10-CM | POA: Diagnosis not present

## 2016-11-11 DIAGNOSIS — E1136 Type 2 diabetes mellitus with diabetic cataract: Secondary | ICD-10-CM | POA: Diagnosis not present

## 2016-11-11 DIAGNOSIS — E785 Hyperlipidemia, unspecified: Secondary | ICD-10-CM | POA: Insufficient documentation

## 2016-11-11 DIAGNOSIS — J449 Chronic obstructive pulmonary disease, unspecified: Secondary | ICD-10-CM | POA: Diagnosis not present

## 2016-11-11 DIAGNOSIS — M1990 Unspecified osteoarthritis, unspecified site: Secondary | ICD-10-CM | POA: Insufficient documentation

## 2016-11-11 DIAGNOSIS — H2511 Age-related nuclear cataract, right eye: Secondary | ICD-10-CM | POA: Diagnosis not present

## 2016-11-11 DIAGNOSIS — K219 Gastro-esophageal reflux disease without esophagitis: Secondary | ICD-10-CM | POA: Diagnosis not present

## 2016-11-11 DIAGNOSIS — R51 Headache: Secondary | ICD-10-CM | POA: Diagnosis not present

## 2016-11-11 DIAGNOSIS — G4730 Sleep apnea, unspecified: Secondary | ICD-10-CM | POA: Insufficient documentation

## 2016-11-11 DIAGNOSIS — F419 Anxiety disorder, unspecified: Secondary | ICD-10-CM | POA: Insufficient documentation

## 2016-11-11 DIAGNOSIS — Z8673 Personal history of transient ischemic attack (TIA), and cerebral infarction without residual deficits: Secondary | ICD-10-CM | POA: Insufficient documentation

## 2016-11-11 DIAGNOSIS — H2513 Age-related nuclear cataract, bilateral: Secondary | ICD-10-CM | POA: Diagnosis not present

## 2016-11-11 DIAGNOSIS — R69 Illness, unspecified: Secondary | ICD-10-CM | POA: Diagnosis not present

## 2016-11-11 HISTORY — DX: Nasal congestion: R09.81

## 2016-11-11 HISTORY — DX: Diverticulosis of intestine, part unspecified, without perforation or abscess without bleeding: K57.90

## 2016-11-11 HISTORY — DX: Transient cerebral ischemic attack, unspecified: G45.9

## 2016-11-11 HISTORY — DX: Other specified disorders of bone density and structure, unspecified site: M85.80

## 2016-11-11 HISTORY — PX: CATARACT EXTRACTION W/PHACO: SHX586

## 2016-11-11 HISTORY — DX: Other allergic rhinitis: J30.89

## 2016-11-11 LAB — GLUCOSE, CAPILLARY: GLUCOSE-CAPILLARY: 172 mg/dL — AB (ref 65–99)

## 2016-11-11 SURGERY — PHACOEMULSIFICATION, CATARACT, WITH IOL INSERTION
Anesthesia: Monitor Anesthesia Care | Site: Eye | Laterality: Right | Wound class: Clean

## 2016-11-11 MED ORDER — FENTANYL CITRATE (PF) 100 MCG/2ML IJ SOLN
INTRAMUSCULAR | Status: DC | PRN
  Administered 2016-11-11: 50 ug via INTRAVENOUS

## 2016-11-11 MED ORDER — MOXIFLOXACIN HCL 0.5 % OP SOLN
OPHTHALMIC | Status: AC
  Filled 2016-11-11: qty 3

## 2016-11-11 MED ORDER — ARMC OPHTHALMIC DILATING DROPS
OPHTHALMIC | Status: AC
  Administered 2016-11-11: 1 via OPHTHALMIC
  Filled 2016-11-11: qty 0.4

## 2016-11-11 MED ORDER — TETRACAINE HCL 0.5 % OP SOLN
OPHTHALMIC | Status: AC
  Filled 2016-11-11: qty 2

## 2016-11-11 MED ORDER — POVIDONE-IODINE 5 % OP SOLN
OPHTHALMIC | Status: AC
  Filled 2016-11-11: qty 30

## 2016-11-11 MED ORDER — MOXIFLOXACIN HCL 0.5 % OP SOLN: 1.0000 [drp] | OPHTHALMIC | Status: DC | PRN

## 2016-11-11 MED ORDER — SODIUM CHLORIDE 0.9 % IV SOLN
INTRAVENOUS | Status: DC | PRN
  Administered 2016-11-11: 08:00:00 via INTRAVENOUS

## 2016-11-11 MED ORDER — SODIUM HYALURONATE 10 MG/ML IO SOLN
INTRAOCULAR | Status: AC
  Filled 2016-11-11: qty 0.85

## 2016-11-11 MED ORDER — MIDAZOLAM HCL 2 MG/2ML IJ SOLN
INTRAMUSCULAR | Status: AC
  Filled 2016-11-11: qty 2

## 2016-11-11 MED ORDER — ERYTHROMYCIN 5 MG/GM OP OINT
TOPICAL_OINTMENT | OPHTHALMIC | Status: AC
  Filled 2016-11-11: qty 1

## 2016-11-11 MED ORDER — TETRACAINE HCL 0.5 % OP SOLN
OPHTHALMIC | Status: DC | PRN
  Administered 2016-11-11: 2 [drp]

## 2016-11-11 MED ORDER — SODIUM CHLORIDE 0.9 % IV SOLN
INTRAVENOUS | Status: DC
  Administered 2016-11-11: 06:00:00 via INTRAVENOUS

## 2016-11-11 MED ORDER — EPINEPHRINE PF 1 MG/ML IJ SOLN
INTRAOCULAR | Status: DC | PRN
  Administered 2016-11-11: 08:00:00 via OPHTHALMIC

## 2016-11-11 MED ORDER — CARBACHOL 0.01 % IO SOLN
INTRAOCULAR | Status: DC | PRN
  Administered 2016-11-11: 0.5 mL via INTRAOCULAR

## 2016-11-11 MED ORDER — LIDOCAINE HCL (PF) 4 % IJ SOLN
INTRAOCULAR | Status: DC | PRN
  Administered 2016-11-11: 08:00:00 via OPHTHALMIC

## 2016-11-11 MED ORDER — SODIUM HYALURONATE 23 MG/ML IO SOLN
INTRAOCULAR | Status: DC | PRN
  Administered 2016-11-11: 0.6 mL via INTRAOCULAR

## 2016-11-11 MED ORDER — EPINEPHRINE PF 1 MG/ML IJ SOLN
INTRAMUSCULAR | Status: AC
  Filled 2016-11-11: qty 2

## 2016-11-11 MED ORDER — LIDOCAINE HCL (PF) 2 % IJ SOLN
INTRAMUSCULAR | Status: AC
  Filled 2016-11-11: qty 2

## 2016-11-11 MED ORDER — SODIUM HYALURONATE 23 MG/ML IO SOLN
INTRAOCULAR | Status: AC
  Filled 2016-11-11: qty 0.6

## 2016-11-11 MED ORDER — FENTANYL CITRATE (PF) 100 MCG/2ML IJ SOLN
INTRAMUSCULAR | Status: AC
  Filled 2016-11-11: qty 2

## 2016-11-11 MED ORDER — MIDAZOLAM HCL 2 MG/2ML IJ SOLN
INTRAMUSCULAR | Status: DC | PRN
Start: 2016-11-11 — End: 2016-11-11
  Administered 2016-11-11 (×2): 1 mg via INTRAVENOUS

## 2016-11-11 MED ORDER — ARMC OPHTHALMIC DILATING DROPS
1.0000 "application " | OPHTHALMIC | Status: AC
  Administered 2016-11-11 (×3): 1 via OPHTHALMIC

## 2016-11-11 MED ORDER — SODIUM HYALURONATE 10 MG/ML IO SOLN
INTRAOCULAR | Status: DC | PRN
  Administered 2016-11-11: 0.85 mL via INTRAOCULAR

## 2016-11-11 MED ORDER — POVIDONE-IODINE 5 % OP SOLN
OPHTHALMIC | Status: DC | PRN
  Administered 2016-11-11: 1 via OPHTHALMIC

## 2016-11-11 SURGICAL SUPPLY — 21 items
CANNULA ANT/CHMB 27G (MISCELLANEOUS) ×2 IMPLANT
CANNULA ANT/CHMB 27GA (MISCELLANEOUS) ×4 IMPLANT
CUP MEDICINE 2OZ PLAST GRAD ST (MISCELLANEOUS) ×2 IMPLANT
DISSECTOR HYDRO NUCLEUS 50X22 (MISCELLANEOUS) ×2 IMPLANT
GLOVE BIO SURGEON STRL SZ8 (GLOVE) ×2 IMPLANT
GLOVE BIOGEL M 6.5 STRL (GLOVE) ×2 IMPLANT
GLOVE SURG LX 7.5 STRW (GLOVE) ×1
GLOVE SURG LX STRL 7.5 STRW (GLOVE) ×1 IMPLANT
GOWN STRL REUS W/ TWL LRG LVL3 (GOWN DISPOSABLE) ×2 IMPLANT
GOWN STRL REUS W/TWL LRG LVL3 (GOWN DISPOSABLE) ×4
LENS IOL TECNIS SYMFONY 21.5 ×1 IMPLANT
PACK CATARACT (MISCELLANEOUS) ×2 IMPLANT
PACK CATARACT KING (MISCELLANEOUS) ×2 IMPLANT
PACK EYE AFTER SURG (MISCELLANEOUS) ×2 IMPLANT
SOL BSS BAG (MISCELLANEOUS) ×2
SOLUTION BSS BAG (MISCELLANEOUS) ×1 IMPLANT
SYR 3ML LL SCALE MARK (SYRINGE) ×4 IMPLANT
SYR 5ML LL (SYRINGE) ×2 IMPLANT
SYR TB 1ML 27GX1/2 LL (SYRINGE) ×2 IMPLANT
WATER STERILE IRR 250ML POUR (IV SOLUTION) ×2 IMPLANT
WIPE NON LINTING 3.25X3.25 (MISCELLANEOUS) ×2 IMPLANT

## 2016-11-11 NOTE — Anesthesia Preprocedure Evaluation (Signed)
Anesthesia Evaluation  Patient identified by MRN, date of birth, ID band Patient awake    Reviewed: Allergy & Precautions, NPO status , Patient's Chart, lab work & pertinent test results  History of Anesthesia Complications Negative for: history of anesthetic complications  Airway Mallampati: III  TM Distance: >3 FB Neck ROM: Full    Dental no notable dental hx.    Pulmonary sleep apnea and Continuous Positive Airway Pressure Ventilation , neg COPD,    breath sounds clear to auscultation- rhonchi (-) wheezing      Cardiovascular Exercise Tolerance: Good (-) hypertension(-) CAD and (-) Past MI  Rhythm:Regular Rate:Normal - Systolic murmurs and - Diastolic murmurs    Neuro/Psych  Headaches, PSYCHIATRIC DISORDERS Anxiety TIA   GI/Hepatic Neg liver ROS, GERD  ,  Endo/Other  diabetes (diet controlled)Hypothyroidism   Renal/GU negative Renal ROS     Musculoskeletal  (+) Arthritis ,   Abdominal (+) - obese,   Peds  Hematology negative hematology ROS (+)   Anesthesia Other Findings Past Medical History: No date: Arthritis     Comment: rheumatoid No date: Diverticulosis No date: Environmental and seasonal allergies No date: GERD (gastroesophageal reflux disease) No date: Hyperlipidemia No date: Osteopenia No date: Sinus congestion No date: Sleep apnea     Comment: uses CPAP No date: Stroke [facility])     Comment: TIA 6 years ago no issues since No date: TIA (transient ischemic attack)   Reproductive/Obstetrics                             Anesthesia Physical Anesthesia Plan  ASA: III  Anesthesia Plan: MAC   Post-op Pain Management:    Induction: Intravenous  Airway Management Planned: Natural Airway  Additional Equipment:   Intra-op Plan:   Post-operative Plan:   Informed Consent: I have reviewed the patients History and Physical, chart, labs and discussed the procedure including  the risks, benefits and alternatives for the proposed anesthesia with the patient or authorized representative who has indicated his/her understanding and acceptance.     Plan Discussed with: CRNA and Anesthesiologist  Anesthesia Plan Comments:         Anesthesia Quick Evaluation

## 2016-11-11 NOTE — Anesthesia Post-op Follow-up Note (Cosign Needed)
Anesthesia QCDR form completed.        

## 2016-11-11 NOTE — Anesthesia Postprocedure Evaluation (Signed)
Anesthesia Post Note  Patient: Christine Richards.  Procedure(s) Performed: Procedure(s) (LRB): CATARACT EXTRACTION PHACO AND INTRAOCULAR LENS PLACEMENT (IOC) (Right)  Patient location during evaluation: PACU Anesthesia Type: MAC Level of consciousness: awake and alert and oriented Pain management: pain level controlled Vital Signs Assessment: post-procedure vital signs reviewed and stable Respiratory status: spontaneous breathing, nonlabored ventilation, respiratory function stable and patient connected to nasal cannula oxygen Cardiovascular status: stable and blood pressure returned to baseline Anesthetic complications: no     Last Vitals:  Vitals:   11/11/16 0619 11/11/16 0817  BP: 139/73 125/67  Pulse: 64 (!) 58  Resp: 16   Temp: 36.2 C     Last Pain:  Vitals:   11/11/16 0817  TempSrc: Oral                 Macen Joslin

## 2016-11-11 NOTE — H&P (Signed)
The History and Physical notes are on paper, have been signed, and are to be scanned.   I have examined the patient and there are no changes to the H&P.   Benay Pillow 11/11/2016 7:32 AM

## 2016-11-11 NOTE — Op Note (Signed)
OPERATIVE NOTE  Christine Richards [id number] 11/11/2016   PREOPERATIVE DIAGNOSIS:  Nuclear sclerotic cataract right eye.  H25.11   POSTOPERATIVE DIAGNOSIS:    Nuclear sclerotic cataract right eye.     PROCEDURE:  Phacoemusification with posterior chamber intraocular lens placement of the right eye   LENS:   Implant Name Type Inv. Item Serial No. Manufacturer Lot No. LRB No. Used  LENS IOL SYMFONY 21.5 - [id number]   LENS IOL SYMFONY 21.5 [id number] AMO   Right 1       [id number] +21.5 multifocal lens    ULTRASOUND TIME: 1 minutes 04 seconds.  CDE 9.64   SURGEON:  Benay Pillow, MD, MPH  ANESTHESIOLOGIST: Anesthesiologist: Emmie Niemann, MD CRNA: Darlyne Russian, CRNA   ANESTHESIA:  Topical with tetracaine drops augmented with 1% preservative-free intracameral lidocaine.  ESTIMATED BLOOD LOSS: less than 1 mL.   COMPLICATIONS:  None.   DESCRIPTION OF PROCEDURE:  The patient was identified in the holding room and transported to the operating room and placed in the supine position under the operating microscope.  The right eye was identified as the operative eye and it was prepped and draped in the usual sterile ophthalmic fashion.   A 1.0 millimeter clear-corneal paracentesis was made at the 10:30 position. 0.5 ml of preservative-free 1% lidocaine with epinephrine was injected into the anterior chamber.  The anterior chamber was filled with Healon 5 viscoelastic.  A 2.4 millimeter keratome was used to make a near-clear corneal incision at the 8:00 position.  A curvilinear capsulorrhexis was made with a cystotome and capsulorrhexis forceps.  Balanced salt solution was used to hydrodissect and hydrodelineate the nucleus.   Phacoemulsification was then used in stop and chop fashion to remove the lens nucleus and epinucleus.  The remaining cortex was then removed using the irrigation and aspiration handpiece. Healon was then placed into the capsular bag to distend it for lens placement.  A lens  was then injected into the capsular bag.  The remaining viscoelastic was aspirated.   Wounds were hydrated with balanced salt solution.  The anterior chamber was inflated to a physiologic pressure with balanced salt solution.   Intracameral vigamox 0.1 mL undiluted was injected into the eye and a drop placed onto the ocular surface.  Patient complained of discomfort including burning even at the end of the case, which went very smoothly with gentle technique utilized at every step.  No wound leaks were noted.  The patient was taken to the recovery room in stable condition without complications of anesthesia or surgery  Benay Pillow 11/11/2016, 8:15 AM

## 2016-11-11 NOTE — Transfer of Care (Signed)
Immediate Anesthesia Transfer of Care Note  Patient: Christine Richards.  Procedure(s) Performed: Procedure(s) with comments: CATARACT EXTRACTION PHACO AND INTRAOCULAR LENS PLACEMENT (IOC) (Right) - [place] 1:04.9 AP% 14.6 CDE 9.64 Fluid pack lot # [id number]  Patient Location: PACU  Anesthesia Type:MAC  Level of Consciousness: awake, alert  and oriented  Airway & Oxygen Therapy: Patient Spontanous Breathing  Post-op Assessment: Report given to RN and Post -op Vital signs reviewed and stable  Post vital signs: Reviewed and stable  Last Vitals:  Vitals:   11/11/16 0619 11/11/16 0817  BP: 139/73 125/67  Pulse: 64 (!) 58  Resp: 16   Temp: 36.2 C     Last Pain:  Vitals:   11/11/16 0817  TempSrc: Oral         Complications: No apparent anesthesia complications

## 2016-11-12 ENCOUNTER — Encounter: Payer: Self-pay | Admitting: Ophthalmology

## 2016-11-25 ENCOUNTER — Telehealth: Payer: Self-pay | Admitting: Physician Assistant

## 2016-11-25 NOTE — Telephone Encounter (Signed)
lvm for pt to explain what T2DM meant on the appt note for CPE & f/up Type 2 Diabetes mellitus - knb

## 2016-12-09 ENCOUNTER — Encounter: Payer: Self-pay | Admitting: Physician Assistant

## 2016-12-09 ENCOUNTER — Ambulatory Visit: Payer: Medicare HMO

## 2016-12-09 ENCOUNTER — Ambulatory Visit (INDEPENDENT_AMBULATORY_CARE_PROVIDER_SITE_OTHER): Payer: Medicare HMO | Admitting: Physician Assistant

## 2016-12-09 VITALS — BP 114/62 | HR 68 | Temp 98.1°F | Ht 65.0 in | Wt 175.4 lb

## 2016-12-09 DIAGNOSIS — Z0000 Encounter for general adult medical examination without abnormal findings: Secondary | ICD-10-CM

## 2016-12-09 DIAGNOSIS — Z1239 Encounter for other screening for malignant neoplasm of breast: Secondary | ICD-10-CM

## 2016-12-09 DIAGNOSIS — Z1231 Encounter for screening mammogram for malignant neoplasm of breast: Secondary | ICD-10-CM

## 2016-12-09 DIAGNOSIS — R69 Illness, unspecified: Secondary | ICD-10-CM | POA: Diagnosis not present

## 2016-12-09 DIAGNOSIS — E119 Type 2 diabetes mellitus without complications: Secondary | ICD-10-CM

## 2016-12-09 DIAGNOSIS — F418 Other specified anxiety disorders: Secondary | ICD-10-CM

## 2016-12-09 LAB — POCT UA - MICROALBUMIN: Microalbumin Ur, POC: 50 mg/L

## 2016-12-09 LAB — POCT GLYCOSYLATED HEMOGLOBIN (HGB A1C)
Est. average glucose Bld gHb Est-mCnc: 183
HEMOGLOBIN A1C: 8

## 2016-12-09 NOTE — Patient Instructions (Signed)
Christine Richards , Thank you for taking time to come for your Medicare Wellness Visit. I appreciate your ongoing commitment to your health goals. Please review the following plan we discussed and let me know if I can assist you in the future.   Screening recommendations/referrals: Colonoscopy: last done 12/15/11 Mammogram: last done 09/23/15 Bone Density: last done 11/07/12 Recommended yearly ophthalmology/optometry visit for glaucoma screening and checkup Recommended yearly dental visit for hygiene and checkup  Vaccinations: Influenza vaccine: declined Pneumococcal vaccine: had Prevnar 13 08/06/15, declined Pneumovax23 today Tdap vaccine: last done 12/22/07, due 12/21/17 Shingles vaccine: declined    Advanced directives: declined  Next appointment: None   Preventive Care 71 Years and Older, Female Preventive care refers to lifestyle choices and visits with your health care provider that can promote health and wellness. What does preventive care include?  A yearly physical exam. This is also called an annual well check.  Dental exams once or twice a year.  Routine eye exams. Ask your health care provider how often you should have your eyes checked.  Personal lifestyle choices, including:  Daily care of your teeth and gums.  Regular physical activity.  Eating a healthy diet.  Avoiding tobacco and drug use.  Limiting alcohol use.  Practicing safe sex.  Taking low-dose aspirin every day.  Taking vitamin and mineral supplements as recommended by your health care provider. What happens during an annual well check? The services and screenings done by your health care provider during your annual well check will depend on your age, overall health, lifestyle risk factors, and family history of disease. Counseling  Your health care provider may ask you questions about your:  Alcohol use.  Tobacco use.  Drug use.  Emotional well-being.  Home and relationship well-being.  Sexual  activity.  Eating habits.  History of falls.  Memory and ability to understand (cognition).  Work and work Statistician.  Reproductive health. Screening  You may have the following tests or measurements:  Height, weight, and BMI.  Blood pressure.  Lipid and cholesterol levels. These may be checked every 5 years, or more frequently if you are over 51 years old.  Skin check.  Lung cancer screening. You may have this screening every year starting at age 83 if you have a 30-pack-year history of smoking and currently smoke or have quit within the past 15 years.  Fecal occult blood test (FOBT) of the stool. You may have this test every year starting at age 55.  Flexible sigmoidoscopy or colonoscopy. You may have a sigmoidoscopy every 5 years or a colonoscopy every 10 years starting at age 82.  Hepatitis C blood test.  Hepatitis B blood test.  Sexually transmitted disease (STD) testing.  Diabetes screening. This is done by checking your blood sugar (glucose) after you have not eaten for a while (fasting). You may have this done every 1-3 years.  Bone density scan. This is done to screen for osteoporosis. You may have this done starting at age 58.  Mammogram. This may be done every 1-2 years. Talk to your health care provider about how often you should have regular mammograms. Talk with your health care provider about your test results, treatment options, and if necessary, the need for more tests. Vaccines  Your health care provider may recommend certain vaccines, such as:  Influenza vaccine. This is recommended every year.  Tetanus, diphtheria, and acellular pertussis (Tdap, Td) vaccine. You may need a Td booster every 10 years.  Zoster vaccine. You may  need this after age 27.  Pneumococcal 13-valent conjugate (PCV13) vaccine. One dose is recommended after age 106.  Pneumococcal polysaccharide (PPSV23) vaccine. One dose is recommended after age 73. Talk to your health care  provider about which screenings and vaccines you need and how often you need them. This information is not intended to replace advice given to you by your health care provider. Make sure you discuss any questions you have with your health care provider. Document Released: 09/12/2015 Document Revised: 05/05/2016 Document Reviewed: 06/17/2015 Elsevier Interactive Patient Education  2017 [place] Prevention in the Home Falls can cause injuries. They can happen to people of all ages. There are many things you can do to make your home safe and to help prevent falls. What can I do on the outside of my home?  Regularly fix the edges of walkways and driveways and fix any cracks.  Remove anything that might make you trip as you walk through a door, such as a raised step or threshold.  Trim any bushes or trees on the path to your home.  Use bright outdoor lighting.  Clear any walking paths of anything that might make someone trip, such as rocks or tools.  Regularly check to see if handrails are loose or broken. Make sure that both sides of any steps have handrails.  Any raised decks and porches should have guardrails on the edges.  Have any leaves, snow, or ice cleared regularly.  Use sand or salt on walking paths during winter.  Clean up any spills in your garage right away. This includes oil or grease spills. What can I do in the bathroom?  Use night lights.  Install grab bars by the toilet and in the tub and shower. Do not use towel bars as grab bars.  Use non-skid mats or decals in the tub or shower.  If you need to sit down in the shower, use a plastic, non-slip stool.  Keep the floor dry. Clean up any water that spills on the floor as soon as it happens.  Remove soap buildup in the tub or shower regularly.  Attach bath mats securely with double-sided non-slip rug tape.  Do not have throw rugs and other things on the floor that can make you trip. What can I do in  the bedroom?  Use night lights.  Make sure that you have a light by your bed that is easy to reach.  Do not use any sheets or blankets that are too big for your bed. They should not hang down onto the floor.  Have a firm chair that has side arms. You can use this for support while you get dressed.  Do not have throw rugs and other things on the floor that can make you trip. What can I do in the kitchen?  Clean up any spills right away.  Avoid walking on wet floors.  Keep items that you use a lot in easy-to-reach places.  If you need to reach something above you, use a strong step stool that has a grab bar.  Keep electrical cords out of the way.  Do not use floor polish or wax that makes floors slippery. If you must use wax, use non-skid floor wax.  Do not have throw rugs and other things on the floor that can make you trip. What can I do with my stairs?  Do not leave any items on the stairs.  Make sure that there are handrails on both sides  of the stairs and use them. Fix handrails that are broken or loose. Make sure that handrails are as long as the stairways.  Check any carpeting to make sure that it is firmly attached to the stairs. Fix any carpet that is loose or worn.  Avoid having throw rugs at the top or bottom of the stairs. If you do have throw rugs, attach them to the floor with carpet tape.  Make sure that you have a light switch at the top of the stairs and the bottom of the stairs. If you do not have them, ask someone to add them for you. What else can I do to help prevent falls?  Wear shoes that:  Do not have high heels.  Have rubber bottoms.  Are comfortable and fit you well.  Are closed at the toe. Do not wear sandals.  If you use a stepladder:  Make sure that it is fully opened. Do not climb a closed stepladder.  Make sure that both sides of the stepladder are locked into place.  Ask someone to hold it for you, if possible.  Clearly mark and  make sure that you can see:  Any grab bars or handrails.  First and last steps.  Where the edge of each step is.  Use tools that help you move around (mobility aids) if they are needed. These include:  Canes.  Walkers.  Scooters.  Crutches.  Turn on the lights when you go into a dark area. Replace any light bulbs as soon as they burn out.  Set up your furniture so you have a clear path. Avoid moving your furniture around.  If any of your floors are uneven, fix them.  If there are any pets around you, be aware of where they are.  Review your medicines with your doctor. Some medicines can make you feel dizzy. This can increase your chance of falling. Ask your doctor what other things that you can do to help prevent falls. This information is not intended to replace advice given to you by your health care provider. Make sure you discuss any questions you have with your health care provider. Document Released: 06/12/2009 Document Revised: 01/22/2016 Document Reviewed: 09/20/2014 Elsevier Interactive Patient Education  2017 [employer].

## 2016-12-09 NOTE — Patient Instructions (Signed)
Carbohydrate Counting for Diabetes Mellitus, Adult Carbohydrate counting is a method for keeping track of how many carbohydrates you eat. Eating carbohydrates naturally increases the amount of sugar (glucose) in the blood. Counting how many carbohydrates you eat helps keep your blood glucose within normal limits, which helps you manage your diabetes (diabetes mellitus). It is important to know how many carbohydrates you can safely have in each meal. This is different for every person. A diet and nutrition specialist (registered dietitian) can help you make a meal plan and calculate how many carbohydrates you should have at each meal and snack. Carbohydrates are found in the following foods:  Grains, such as breads and cereals.  Dried beans and soy products.  Starchy vegetables, such as potatoes, peas, and corn.  Fruit and fruit juices.  Milk and yogurt.  Sweets and snack foods, such as cake, cookies, candy, chips, and soft drinks. How do I count carbohydrates? There are two ways to count carbohydrates in food. You can use either of the methods or a combination of both. Reading "Nutrition Facts" on packaged food  The "Nutrition Facts" list is included on the labels of almost all packaged foods and beverages in the [place] It includes:  The serving size.  Information about nutrients in each serving, including the grams (g) of carbohydrate per serving. To use the "Nutrition Facts":  Decide how many servings you will have.  Multiply the number of servings by the number of carbohydrates per serving.  The resulting number is the total amount of carbohydrates that you will be having. Learning standard serving sizes of other foods  When you eat foods containing carbohydrates that are not packaged or do not include "Nutrition Facts" on the label, you need to measure the servings in order to count the amount of carbohydrates:  Measure the foods that you will eat with a food scale or measuring  cup, if needed.  Decide how many standard-size servings you will eat.  Multiply the number of servings by 15. Most carbohydrate-rich foods have about 15 g of carbohydrates per serving.  For example, if you eat 8 oz (170 g) of strawberries, you will have eaten 2 servings and 30 g of carbohydrates (2 servings x 15 g = 30 g).  For foods that have more than one food mixed, such as soups and casseroles, you must count the carbohydrates in each food that is included. The following list contains standard serving sizes of common carbohydrate-rich foods. Each of these servings has about 15 g of carbohydrates:   hamburger bun or  English muffin.   oz (15 mL) syrup.   oz (14 g) jelly.  1 slice of bread.  1 six-inch tortilla.  3 oz (85 g) cooked rice or pasta.  4 oz (113 g) cooked dried beans.  4 oz (113 g) starchy vegetable, such as peas, corn, or potatoes.  4 oz (113 g) hot cereal.  4 oz (113 g) mashed potatoes or  of a large baked potato.  4 oz (113 g) canned or frozen fruit.  4 oz (120 mL) fruit juice.  4-6 crackers.  6 chicken nuggets.  6 oz (170 g) unsweetened dry cereal.  6 oz (170 g) plain fat-free yogurt or yogurt sweetened with artificial sweeteners.  8 oz (240 mL) milk.  8 oz (170 g) fresh fruit or one small piece of fruit.  24 oz (680 g) popped popcorn. Example of carbohydrate counting Sample meal  3 oz (85 g) chicken breast.  6 oz (  170 g) brown rice.  4 oz (113 g) corn.  8 oz (240 mL) milk.  8 oz (170 g) strawberries with sugar-free whipped topping. Carbohydrate calculation 1. Identify the foods that contain carbohydrates:  Rice.  Corn.  Milk.  Strawberries. 2. Calculate how many servings you have of each food:  2 servings rice.  1 serving corn.  1 serving milk.  1 serving strawberries. 3. Multiply each number of servings by 15 g:  2 servings rice x 15 g = 30 g.  1 serving corn x 15 g = 15 g.  1 serving milk x 15 g = 15  g.  1 serving strawberries x 15 g = 15 g. 4. Add together all of the amounts to find the total grams of carbohydrates eaten:  30 g + 15 g + 15 g + 15 g = 75 g of carbohydrates total. This information is not intended to replace advice given to you by your health care provider. Make sure you discuss any questions you have with your health care provider. Document Released: 08/16/2005 Document Revised: 03/05/2016 Document Reviewed: 01/28/2016 Elsevier Interactive Patient Education  2017 [employer]  

## 2016-12-09 NOTE — Progress Notes (Signed)
Patient: Christine Richards., Female    DOB: 1946/06/21, 71 y.o.   MRN: [id number] Visit Date: 12/09/2016  Today's Provider: Mar Daring, PA-C   Chief Complaint  Patient presents with  . Annual Exam   Subjective:    Annual wellness visit Christine Richards. is a 71 y.o. female. She feels well. She reports exercising 4 days a week. She reports she is sleeping well.  08/06/15 CPE 09/23/15 Mammogram-BI-RADS 1 12/15/11 Colonoscopy-diverticulosis 06/26/14 BMD-osteopenia -----------------------------------------------------------  Diabetes Mellitus Type II, Follow-up:   Lab Results  Component Value Date   HGBA1C 8.0 12/09/2016   HGBA1C 7.5 (H) 09/14/2016   HGBA1C 7.9 05/31/2016    Last seen for diabetes 3 months ago.  Management since then includes no changes. She reports excellent compliance with treatment. She is not having side effects.  Current symptoms include none and have been stable. Home blood sugar records: not being checked  Episodes of hypoglycemia? no   Current Insulin Regimen: none Most Recent Eye Exam: UTD Weight trend: stable Prior visit with dietician: no Current diet: in general, a "healthy" diet   Current exercise: walking  Pertinent Labs:    Component Value Date/Time   CHOL 332 (H) 09/14/2016 0914   TRIG 220 (H) 09/14/2016 0914   HDL 46 09/14/2016 0914   LDLCALC 242 (H) 09/14/2016 0914   CREATININE 0.75 09/14/2016 0914   CREATININE 0.72 03/17/2013 1443    Wt Readings from Last 3 Encounters:  12/09/16 175 lb 6.4 oz (79.6 kg)  11/11/16 172 lb (78 kg)  09/10/16 172 lb (78 kg)   ------------------------------------------------------------------------    Review of Systems  Constitutional: Positive for diaphoresis.  HENT: Positive for hearing loss and tinnitus.   Eyes: Positive for photophobia.  Respiratory: Positive for apnea and shortness of breath.   Cardiovascular: Negative.   Gastrointestinal: Positive for abdominal  distention.  Endocrine: Negative.   Genitourinary: Positive for urgency.  Musculoskeletal: Negative.   Skin: Negative.   Allergic/Immunologic: Negative.   Neurological: Negative.   Hematological: Negative.   Psychiatric/Behavioral: Negative.     Social History   Social History  . Marital status: Widowed    Spouse name: N/A  . Number of children: 2  . Years of education: College   Occupational History  . [employer]     Full Time   Social History Main Topics  . Smoking status: Never Smoker  . Smokeless tobacco: Never Used  . Alcohol use No  . Drug use: No  . Sexual activity: Not on file   Other Topics Concern  . Not on file   Social History Narrative  . No narrative on file    Past Medical History:  Diagnosis Date  . Arthritis    rheumatoid  . Diverticulosis   . Environmental and seasonal allergies   . GERD (gastroesophageal reflux disease)   . Hyperlipidemia   . Osteopenia   . Sinus congestion   . Sleep apnea    uses CPAP  . Stroke [facility])    TIA 6 years ago no issues since  . TIA (transient ischemic attack)      Patient Active Problem List   Diagnosis Date Noted  . Heartburn   . Gastritis   . Situational anxiety 11/20/2015  . Arthritis 02/11/2015  . Allergic rhinitis 12/25/2014  . Aneurysm ([place]) 12/25/2014  . 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 Controlled diabetes mellitus type II without complication ([place]) 37/62/8315  . Aerophobia 12/25/2014  .  Fibrositis 12/25/2014  . Headache, migraine 12/25/2014  . Osteopenia 12/25/2014  . Avitaminosis D 12/25/2014  . 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 Acquired hypothyroidism 04/25/2009  . Hypercholesteremia 02/20/2007  . 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 Apnea, sleep 12/02/2006     Past Surgical History:  Procedure Laterality Date  . BREAST BIOPSY Right 2010   benign  . BREAST CYST ASPIRATION Right   . BREAST SURGERY  2010   biopsy  . CATARACT EXTRACTION W/PHACO Right 11/11/2016   Procedure: CATARACT EXTRACTION PHACO AND INTRAOCULAR LENS PLACEMENT (IOC);  Surgeon: Eulogio Bear, MD;  Location: [facility] ORS;  Service: Ophthalmology;  Laterality: Right;  [place] 1:04.9 AP% 14.6 CDE 9.64 Fluid pack lot # [id number]  . ESOPHAGOGASTRODUODENOSCOPY (EGD) WITH PROPOFOL N/A 12/29/2015   Procedure: ESOPHAGOGASTRODUODENOSCOPY (EGD) WITH PROPOFOL;  Surgeon: Lucilla Lame, MD;  Location: [place];  Service: Endoscopy;  Laterality: N/A;  GASTRI BIOPSY  . TUBAL LIGATION      Her family history includes Atrial fibrillation in her sister; Congestive Heart Failure in her mother; Dementia in her father; Heart attack in her father and mother; Heart disease in her brother; Hyperlipidemia in her sister; Prostate cancer in her father.      Current Outpatient Prescriptions:  .  ALPRAZolam (XANAX) 0.5 MG tablet, Take 1 tablet (0.5 mg total) by mouth 2 (two) times daily as needed for anxiety. (Patient taking differently: Take 0.5 mg by mouth. ), Disp: 14 tablet, Rfl: 0 .  Cholecalciferol (VITAMIN D) 2000 UNITS CAPS, Take 2,000 Units by mouth daily. , Disp: , Rfl:  .  DUREZOL 0.05 % EMUL, 2 (two) times daily. , Disp: , Rfl:  .  fenofibrate (TRICOR) 145 MG tablet, Take 1 tablet (145 mg total) by mouth daily., Disp: 30 tablet, Rfl: 3 .  Omega-3 Fatty Acids (FISH OIL) 1000 MG CAPS, Take 2,000 mg by mouth at bedtime. , Disp: , Rfl:  .  Polyethyl Glycol-Propyl Glycol (SYSTANE OP), Apply 1 drop to eye daily as needed (dry eyes)., Disp: , Rfl:   Patient Care Team: Mar Daring, PA-C as PCP - General (Family Medicine) Eulogio Bear, MD as Consulting Physician (Ophthalmology) Christene Slates, MD as Consulting Physician (Dermatology)     Objective:   Vitals:  BP  114/62 (BP  Location: Right Arm)     Pulse  68     Temp  98.1 F (36.7 C) (Oral)     Ht  5\' 5"  (1.651 m)     Wt  175 lb 6.4 oz (79.6 kg)      BMI  29.19 kg/m      Physical Exam  Constitutional: She is oriented to person, place, and time. She appears well-developed and well-nourished. No distress.  HENT:  Head: Normocephalic and atraumatic.  Right Ear: Hearing, tympanic membrane, external ear and ear canal normal.  Left Ear: Hearing, tympanic membrane, external ear and ear canal normal.  Nose: Nose normal.  Mouth/Throat: Uvula is midline, oropharynx is clear and moist and mucous membranes are normal. No oropharyngeal exudate.  Eyes: Conjunctivae and EOM are normal. Pupils are equal, round, and reactive to light. Right eye exhibits no discharge. Left eye exhibits no discharge. No scleral icterus.  Neck: Normal range of motion. Neck supple. No JVD present. Carotid bruit is not present. No tracheal deviation present. No thyromegaly  present.  Cardiovascular: Normal rate, regular rhythm, normal heart sounds and intact distal pulses.  Exam reveals no gallop and no friction rub.   No murmur heard. Pulmonary/Chest: Effort normal and breath sounds normal. No respiratory distress. She has no wheezes. She has no rales. She exhibits no tenderness. Right breast exhibits no inverted nipple, no mass, no nipple discharge, no skin change and no tenderness. Left breast exhibits no inverted nipple, no mass, no nipple discharge, no skin change and no tenderness. Breasts are symmetrical.  Abdominal: Soft. Bowel sounds are normal. She exhibits no distension and no mass. There is no tenderness. There is no rebound and no guarding.  Musculoskeletal: Normal range of motion. She exhibits no edema or tenderness.  Lymphadenopathy:    She has no cervical adenopathy.  Neurological: She is alert and oriented to person, place, and time.  Skin: Skin is warm and dry. No rash noted. She is not diaphoretic.  Psychiatric:  She has a normal mood and affect. Her behavior is normal. Judgment and thought content normal.  Vitals reviewed.  Diabetic Foot Exam - Simple   Simple Foot Form Diabetic Foot exam was performed with the following findings:  Yes 12/09/2016 10:30 AM  Visual Inspection No deformities, no ulcerations, no other skin breakdown bilaterally:  Yes Sensation Testing Intact to touch and monofilament testing bilaterally:  Yes Pulse Check Posterior Tibialis and Dorsalis pulse intact bilaterally:  Yes Comments    Activities of Daily Living In your present state of health, do you have any difficulty performing the following activities: 12/09/2016 12/29/2015  Hearing? Y N  Vision? Y N  Difficulty concentrating or making decisions? N N  Walking or climbing stairs? N N  Dressing or bathing? N N  Doing errands, shopping? N -  Preparing Food and eating ? N -  Using the Toilet? N -  In the past six months, have you accidently leaked urine? N -  Do you have problems with loss of bowel control? N -  Managing your Medications? N -  Managing your Finances? N -  Housekeeping or managing your Housekeeping? N -  Some recent data might be hidden    Fall Risk Assessment Fall Risk  12/09/2016 08/06/2015 02/11/2015  Falls in the past year? No No No   Home Exercise  12/09/2016 11/20/2015  Current Exercise Habits Structured exercise class Home exercise routine  Type of exercise strength training/weights;stretching;treadmill;Other - see comments;walking strength training/weights;walking  Time (Minutes) 60 60  Frequency (Times/Week) 4 4  Weekly Exercise (Minutes/Week) 240 240  Intensity Intense Moderate      Depression Screen PHQ 2/9 Scores 12/09/2016 12/09/2016 08/06/2015 02/11/2015  PHQ - 2 Score 0 0 0 0  PHQ- 9 Score 0 - - -    Cognitive Testing - 6-CIT  Correct? Score   What year is it? yes 0 0 or 4  What month is it? yes 0 0 or 3  Memorize:    Pia Mau,  42,  [place],      What time is  it? (within 1 hour) yes 0 0 or 3  Count backwards from 20 yes 0 0, 2, or 4  Name the months of the year yes 0 0, 2, or 4  Repeat name & address above yes 0 0, 2, 4, 6, 8, or 10       TOTAL SCORE  0/28   Interpretation:  Normal  Normal (0-7) Abnormal (8-28)    Audit-C Alcohol Use Screening  Question Answer Points  How often do you have alcoholic drink? never 0  On days you do drink alcohol, how many drinks do you typically consume? 0 0  How oftey will you drink 6 or more in a total? never 0  Total Score:  0   A score of 3 or more in women, and 4 or more in men indicates increased risk for alcohol abuse, EXCEPT if all of the points are from question 1.     Assessment & Plan:     Annual Wellness Visit  Reviewed patient's Family Medical History Reviewed and updated list of patient's medical providers Assessment of cognitive impairment was done Assessed patient's functional ability Established a written schedule for health screening [place] Completed and Reviewed  Exercise Activities and Dietary recommendations Goals    . Increase water intake          Recommend increasing water intake to 3-4 glasses a day.       Immunization History  Administered Date(s) Administered  . Pneumococcal Conjugate-13 08/06/2015  . Td 12/22/2007  . Tdap 12/22/2007    Health Maintenance  Topic Date Due  . FOOT EXAM  11/19/2016  . PNA vac Low Risk Adult (2 of 2 - PPSV23) 11/28/2017 (Originally 08/05/2016)  . Hepatitis C Screening  12/09/2017 (Originally December 14, 1945)  . INFLUENZA VACCINE  03/30/2017  . HEMOGLOBIN A1C  06/10/2017  . MAMMOGRAM  09/22/2017  . OPHTHALMOLOGY EXAM  09/30/2017  . URINE MICROALBUMIN  12/09/2017  . TETANUS/TDAP  12/21/2017  . COLONOSCOPY  12/14/2021  . DEXA SCAN  Completed     Discussed health benefits of physical activity, and encouraged her to engage in regular exercise appropriate for her age and condition.    1. Annual physical  exam Normal physical exam today for patient's age. Patient refuses Pneumococcal 23 vaccine and Hep C screening.   2. Breast cancer screening Breast exam today was normal. There is no family history of breast cancer. She does not perform regular self breast exams. Mammogram was ordered as below. Information for [facility] clinic was given to patient so she may schedule her mammogram at her convenience. - MM Digital Screening; Future  3. Type 2 diabetes mellitus without complication, without long-term current use of insulin (HCC) A1c increased back to 8.0. Discussed with patient that with her lifestyle changes she may not be able to control her diabetes any longer due to this being a progressive disease process. However, when discussing with patient she actually has many hidden carbs in her diet. Discussed in detail carb counting for diabetes. Information given on AVS. Offered nutrition counseling but patient refuses at this time. States that if A1c increases again at 3 month check she may consider. Also discussed diabetic medications but patient still refuses. I will see her back in 3 months and we will recheck A1c and lipids.  - POCT glycosylated hemoglobin (Hb A1C) - POCT UA - Microalbumin  4. Situational anxiety Secondary stress due to employment. States she has a boss that is really stressing her out. 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 The church is now in financial crisis and on the verge of bankruptcy. She has considered looking for another job.   ------------------------------------------------------------------------------------------------------------    Mar Daring, PA-C  [place]

## 2016-12-09 NOTE — Progress Notes (Signed)
Subjective:   Christine Richards is a 71 y.o. female who presents for Medicare Annual (Subsequent) preventive examination.  Review of Systems:  N/A Cardiac Risk Factors include: advanced age (>24men, >30 women);diabetes mellitus;dyslipidemia     Objective:     Vitals: BP 114/62 (BP Location: Right Arm)   Pulse 68   Temp 98.1 F (36.7 C) (Oral)   Ht 5\' 5"  (1.651 m)   Wt 175 lb 6.4 oz (79.6 kg)   BMI 29.19 kg/m   Body mass index is 29.19 kg/m.   Tobacco History  Smoking Status  . Never Smoker  Smokeless Tobacco  . Never Used     Counseling given: Not Answered   Past Medical History:  Diagnosis Date  . Arthritis    rheumatoid  . Diverticulosis   . Environmental and seasonal allergies   . GERD (gastroesophageal reflux disease)   . Hyperlipidemia   . Osteopenia   . Sinus congestion   . Sleep apnea    uses CPAP  . Stroke [facility])    TIA 6 years ago no issues since  . TIA (transient ischemic attack)    Past Surgical History:  Procedure Laterality Date  . BREAST BIOPSY Right 2010   benign  . BREAST CYST ASPIRATION Right   . BREAST SURGERY  2010   biopsy  . CATARACT EXTRACTION W/PHACO Right 11/11/2016   Procedure: CATARACT EXTRACTION PHACO AND INTRAOCULAR LENS PLACEMENT (IOC);  Surgeon: Eulogio Bear, MD;  Location: [facility] ORS;  Service: Ophthalmology;  Laterality: Right;  [place] 1:04.9 AP% 14.6 CDE 9.64 Fluid pack lot # [id number]  . ESOPHAGOGASTRODUODENOSCOPY (EGD) WITH PROPOFOL N/A 12/29/2015   Procedure: ESOPHAGOGASTRODUODENOSCOPY (EGD) WITH PROPOFOL;  Surgeon: Lucilla Lame, MD;  Location: [place];  Service: Endoscopy;  Laterality: N/A;  GASTRI BIOPSY  . TUBAL LIGATION     Family History  Problem Relation Age of Onset  . Congestive Heart Failure Mother   . Heart attack Mother   . Dementia Father   . Heart attack Father   . Prostate cancer Father   . Atrial fibrillation Sister   . Hyperlipidemia Sister   . Heart disease Brother    History  Sexual  Activity  . Sexual activity: Not on file    Outpatient Encounter Prescriptions as of 12/09/2016  Medication Sig  . ALPRAZolam (XANAX) 0.5 MG tablet Take 1 tablet (0.5 mg total) by mouth 2 (two) times daily as needed for anxiety. (Patient taking differently: Take 0.5 mg by mouth. )  . Cholecalciferol (VITAMIN D) 2000 UNITS CAPS Take 2,000 Units by mouth daily.   . DUREZOL 0.05 % EMUL 2 (two) times daily.   . Omega-3 Fatty Acids (FISH OIL) 1000 MG CAPS Take 2,000 mg by mouth at bedtime.   . fenofibrate (TRICOR) 145 MG tablet Take 1 tablet (145 mg total) by mouth daily. (Patient not taking: Reported on 11/08/2016)  . Polyethyl Glycol-Propyl Glycol (SYSTANE OP) Apply 1 drop to eye daily as needed (dry eyes).   No facility-administered encounter medications on file as of 12/09/2016.     Activities of Daily Living In your present state of health, do you have any difficulty performing the following activities: 12/09/2016 12/29/2015  Hearing? Y N  Vision? Y N  Difficulty concentrating or making decisions? N N  Walking or climbing stairs? N N  Dressing or bathing? N N  Doing errands, shopping? N -  Preparing Food and eating ? N -  Using the Toilet? N -  In  the past six months, have you accidently leaked urine? N -  Do you have problems with loss of bowel control? N -  Managing your Medications? N -  Managing your Finances? N -  Housekeeping or managing your Housekeeping? N -  Some recent data might be hidden    Patient Care Team: Mar Daring, PA-C as PCP - General (Family Medicine) Eulogio Bear, MD as Consulting Physician (Ophthalmology) Christene Slates, MD as Consulting Physician (Dermatology)    Assessment:     Exercise Activities and Dietary recommendations Current Exercise Habits: Structured exercise class, Type of exercise: strength training/weights;stretching;treadmill;Other - see comments;walking (stationary bicycle, eliptical, stair climber), Time (Minutes): 60,  Frequency (Times/Week): 4 (to 5 days a week), Weekly Exercise (Minutes/Week): 240, Intensity: Intense  Goals    . Increase water intake          Recommend increasing water intake to 3-4 glasses a day.      Fall Risk Fall Risk  12/09/2016 08/06/2015 02/11/2015  Falls in the past year? No No No   Depression Screen PHQ 2/9 Scores 12/09/2016 12/09/2016 08/06/2015 02/11/2015  PHQ - 2 Score 0 0 0 0  PHQ- 9 Score 0 - - -     Cognitive Function     6CIT Screen 12/09/2016  What Year? 0 points  What month? 0 points  What time? 0 points  Count back from 20 0 points  Months in reverse 0 points  Repeat phrase 0 points  Total Score 0    Immunization History  Administered Date(s) Administered  . Pneumococcal Conjugate-13 08/06/2015  . Td 12/22/2007  . Tdap 12/22/2007   Screening Tests Health Maintenance  Topic Date Due  . Hepatitis C Screening  03-26-1946  . URINE MICROALBUMIN  08/05/2016  . FOOT EXAM  11/19/2016  . PNA vac Low Risk Adult (2 of 2 - PPSV23) 11/28/2017 (Originally 08/05/2016)  . HEMOGLOBIN A1C  03/14/2017  . INFLUENZA VACCINE  03/30/2017  . MAMMOGRAM  09/22/2017  . OPHTHALMOLOGY EXAM  09/30/2017  . TETANUS/TDAP  12/21/2017  . COLONOSCOPY  12/14/2021  . DEXA SCAN  Completed      Plan:  I have personally reviewed and addressed the Medicare Annual Wellness questionnaire and have noted the following in the patient's chart:  A. Medical and social history B. Use of alcohol, tobacco or illicit drugs  C. Current medications and supplements D. Functional ability and status E.  Nutritional status F.  Physical activity G. Advance directives H. List of other physicians I.  Hospitalizations, surgeries, and ER visits in previous 12 months J.  [place] such as hearing and vision if needed, cognitive and depression L. Referrals and appointments - none  In addition, I have reviewed and discussed with patient certain preventive protocols, quality metrics, and  best practice recommendations. A written personalized care plan for preventive services as well as general preventive health recommendations were provided to patient.  See attached scanned questionnaire for additional information.   Signed,  Fabio Neighbors, LPN Nurse Health Advisor   MD Recommendations: Follow up on Hepatitis C screening, pt would like to talk to PCP about this. Pt declined Pneumovax23 today.  I have reviewed the documentation and information obtained by Fabio Neighbors, LPN in the above chart and agree as above. I was available for consultation if any questions or issues arose.  Fenton Malling, PA-C

## 2017-01-11 ENCOUNTER — Ambulatory Visit
Admission: RE | Admit: 2017-01-11 | Discharge: 2017-01-11 | Disposition: A | Discharge status: Home / Self Care | Payer: Medicare HMO | Source: Ambulatory Visit | Attending: Physician Assistant | Admitting: Physician Assistant

## 2017-01-11 DIAGNOSIS — Z1231 Encounter for screening mammogram for malignant neoplasm of breast: Secondary | ICD-10-CM | POA: Diagnosis not present

## 2017-01-11 DIAGNOSIS — Z1239 Encounter for other screening for malignant neoplasm of breast: Secondary | ICD-10-CM

## 2017-01-12 ENCOUNTER — Telehealth: Payer: Self-pay

## 2017-01-12 DIAGNOSIS — R928 Other abnormal and inconclusive findings on diagnostic imaging of breast: Secondary | ICD-10-CM

## 2017-01-12 NOTE — Progress Notes (Signed)
Patient advised as below.  

## 2017-01-12 NOTE — Telephone Encounter (Signed)
-----   Message from Mar Daring, [place] sent at 01/12/2017  1:23 PM EDT ----- Mammogram abnormal in right breast. Should be getting diagnostic mammogram and US of the right breast scheduled by [place] Poli

## 2017-01-12 NOTE — Telephone Encounter (Signed)
Orders signed.

## 2017-01-20 ENCOUNTER — Ambulatory Visit: Payer: Medicare HMO

## 2017-01-20 ENCOUNTER — Other Ambulatory Visit: Payer: Self-pay

## 2017-01-27 ENCOUNTER — Ambulatory Visit
Admission: RE | Admit: 2017-01-27 | Discharge: 2017-01-27 | Disposition: A | Discharge status: Home / Self Care | Payer: Medicare HMO | Source: Ambulatory Visit | Attending: Physician Assistant | Admitting: Physician Assistant

## 2017-01-27 ENCOUNTER — Telehealth: Payer: Self-pay

## 2017-01-27 DIAGNOSIS — R928 Other abnormal and inconclusive findings on diagnostic imaging of breast: Secondary | ICD-10-CM

## 2017-01-27 DIAGNOSIS — N6313 Unspecified lump in the right breast, lower outer quadrant: Secondary | ICD-10-CM | POA: Diagnosis not present

## 2017-01-27 NOTE — Telephone Encounter (Signed)
Patient advised as directed below. Verbalized understanding.  Thanks,  -Huldah Marin

## 2017-01-27 NOTE — Telephone Encounter (Signed)
-----   Message from Mar Daring, [place] sent at 01/27/2017  4:45 PM EDT ----- Breast US and diagnostic mammogram seem to show a benign appearing cyst in the right breast. Recommend 6 month f/u to check for stability.

## 2017-02-22 DIAGNOSIS — G4733 Obstructive sleep apnea (adult) (pediatric): Secondary | ICD-10-CM | POA: Diagnosis not present

## 2017-03-10 ENCOUNTER — Ambulatory Visit (INDEPENDENT_AMBULATORY_CARE_PROVIDER_SITE_OTHER): Payer: Medicare HMO | Admitting: Physician Assistant

## 2017-03-10 ENCOUNTER — Encounter: Payer: Self-pay | Admitting: Physician Assistant

## 2017-03-10 VITALS — BP 130/80 | HR 74 | Temp 97.9°F | Resp 16 | Ht 65.0 in | Wt 173.0 lb

## 2017-03-10 DIAGNOSIS — E119 Type 2 diabetes mellitus without complications: Secondary | ICD-10-CM | POA: Diagnosis not present

## 2017-03-10 LAB — POCT GLYCOSYLATED HEMOGLOBIN (HGB A1C)
ESTIMATED AVERAGE GLUCOSE: 180
HEMOGLOBIN A1C: 7.9

## 2017-03-10 NOTE — Progress Notes (Signed)
Patient: Christine Richards. Female    DOB: Oct 21, 1945   71 y.o.   MRN: [id number] Visit Date: 03/10/2017  Today's Provider: Mar Daring, PA-C   Chief Complaint  Patient presents with  . Diabetes   Subjective:    HPI  Diabetes Mellitus Type II, Follow-up:   Lab Results  Component Value Date   HGBA1C 7.9 03/10/2017   HGBA1C 8.0 12/09/2016   HGBA1C 7.5 (H) 09/14/2016    Last seen for diabetes 3 months ago.  Management since then includes no changes. She reports good compliance with treatment. She is not having side effects.  Current symptoms include none and have been stable. Patient reports thinning of toe nail on left foot. Home blood sugar records: not being checked  Episodes of hypoglycemia? no   Current Insulin Regimen: none Most Recent Eye Exam: up to date Weight trend: stable Prior visit with dietician: no Current diet: in general, a "healthy" diet   Current exercise: running/ jogging and walking  Pertinent Labs:    Component Value Date/Time   CHOL 332 (H) 09/14/2016 0914   TRIG 220 (H) 09/14/2016 0914   HDL 46 09/14/2016 0914   LDLCALC 242 (H) 09/14/2016 0914   CREATININE 0.75 09/14/2016 0914   CREATININE 0.72 03/17/2013 1443    Wt Readings from Last 3 Encounters:  03/10/17 173 lb (78.5 kg)  12/09/16 175 lb 6.4 oz (79.6 kg)  11/11/16 172 lb (78 kg)   ------------------------------------------------------------------------      Allergies  Allergen Reactions  . Antihistamines, Chlorpheniramine-Type     Facial swelling   . Macrobid [Nitrofurantoin Macrocrystal]     unknown  . Statins     Muscle Pain, Weakness      Current Outpatient Prescriptions:  .  ALPRAZolam (XANAX) 0.5 MG tablet, Take 1 tablet (0.5 mg total) by mouth 2 (two) times daily as needed for anxiety. (Patient taking differently: Take 0.5 mg by mouth. ), Disp: 14 tablet, Rfl: 0 .  Cholecalciferol (VITAMIN D) 2000 UNITS CAPS, Take 2,000 Units by mouth daily. , Disp: ,  Rfl:  .  Omega-3 Fatty Acids (FISH OIL) 1000 MG CAPS, Take 2,000 mg by mouth at bedtime. , Disp: , Rfl:   Review of Systems  Constitutional: Negative.   Eyes: Negative.   Respiratory: Negative.   Cardiovascular: Negative.   Gastrointestinal: Negative.   Endocrine: Negative.   Neurological: Negative.     Social History  Substance Use Topics  . Smoking status: Never Smoker  . Smokeless tobacco: Never Used  . Alcohol use No   Objective:   BP 130/80 (BP Location: Left Arm, Patient Position: Sitting, Cuff Size: Large)   Pulse 74   Temp 97.9 F (36.6 C) (Oral)   Resp 16   Ht 5\' 5"  (1.651 m)   Wt 173 lb (78.5 kg)   SpO2 97%   BMI 28.79 kg/m  Vitals:   03/10/17 1625  BP: 130/80  Pulse: 74  Resp: 16  Temp: 97.9 F (36.6 C)  TempSrc: Oral  SpO2: 97%  Weight: 173 lb (78.5 kg)  Height: 5\' 5"  (1.651 m)     Physical Exam  Constitutional: She appears well-developed and well-nourished. No distress.  Neck: Normal range of motion. Neck supple.  Cardiovascular: Normal rate, regular rhythm, normal heart sounds and intact distal pulses.  Exam reveals no gallop and no friction rub.   No murmur heard. Pulmonary/Chest: Effort normal and breath sounds normal. No respiratory distress. She has no  wheezes. She has no rales.  Musculoskeletal: She exhibits no edema.  Skin: She is not diaphoretic.  Vitals reviewed.       Assessment & Plan:     1. Type 2 diabetes mellitus without complication, without long-term current use of insulin (HCC) A1c improved slightly and back down to 7.9%. Patient agreed to diabetic education class and this order was placed as below. Still refuses medications. I will see her back in 3 months to recheck A1c.  - POCT glycosylated hemoglobin (Hb A1C) - Ambulatory referral to diabetic education       Mar Daring, PA-C  [facility]

## 2017-03-10 NOTE — Patient Instructions (Signed)
Diabetes Mellitus and Food It is important for you to manage your blood sugar (glucose) level. Your blood glucose level can be greatly affected by what you eat. Eating healthier foods in the appropriate amounts throughout the day at about the same time each day will help you control your blood glucose level. It can also help slow or prevent worsening of your diabetes mellitus. Healthy eating may even help you improve the level of your blood pressure and reach or maintain a healthy weight. General recommendations for healthful eating and cooking habits include:  Eating meals and snacks regularly. Avoid going long periods of time without eating to lose weight.  Eating a diet that consists mainly of plant-based foods, such as fruits, vegetables, nuts, legumes, and whole grains.  Using low-heat cooking methods, such as baking, instead of high-heat cooking methods, such as deep frying.  Work with your dietitian to make sure you understand how to use the Nutrition Facts information on food labels. How can food affect me? Carbohydrates Carbohydrates affect your blood glucose level more than any other type of food. Your dietitian will help you determine how many carbohydrates to eat at each meal and teach you how to count carbohydrates. Counting carbohydrates is important to keep your blood glucose at a healthy level, especially if you are using insulin or taking certain medicines for diabetes mellitus. Alcohol Alcohol can cause sudden decreases in blood glucose (hypoglycemia), especially if you use insulin or take certain medicines for diabetes mellitus. Hypoglycemia can be a life-threatening condition. Symptoms of hypoglycemia (sleepiness, dizziness, and disorientation) are similar to symptoms of having too much alcohol. If your health care provider has given you approval to drink alcohol, do so in moderation and use the following guidelines:  Women should not have more than one drink per day, and men  should not have more than two drinks per day. One drink is equal to: ? 12 oz of beer. ? 5 oz of wine. ? 1 oz of hard liquor.  Do not drink on an empty stomach.  Keep yourself hydrated. Have water, diet soda, or unsweetened iced tea.  Regular soda, juice, and other mixers might contain a lot of carbohydrates and should be counted.  What foods are not recommended? As you make food choices, it is important to remember that all foods are not the same. Some foods have fewer nutrients per serving than other foods, even though they might have the same number of calories or carbohydrates. It is difficult to get your body what it needs when you eat foods with fewer nutrients. Examples of foods that you should avoid that are high in calories and carbohydrates but low in nutrients include:  Trans fats (most processed foods list trans fats on the Nutrition Facts label).  Regular soda.  Juice.  Candy.  Sweets, such as cake, pie, doughnuts, and cookies.  Fried foods.  What foods can I eat? Eat nutrient-rich foods, which will nourish your body and keep you healthy. The food you should eat also will depend on several factors, including:  The calories you need.  The medicines you take.  Your weight.  Your blood glucose level.  Your blood pressure level.  Your cholesterol level.  You should eat a variety of foods, including:  Protein. ? Lean cuts of meat. ? Proteins low in saturated fats, such as fish, egg whites, and beans. Avoid processed meats.  Fruits and vegetables. ? Fruits and vegetables that may help control blood glucose levels, such as apples,   mangoes, and yams.  Dairy products. ? Choose fat-free or low-fat dairy products, such as milk, yogurt, and cheese.  Grains, bread, pasta, and rice. ? Choose whole grain products, such as multigrain bread, whole oats, and brown rice. These foods may help control blood pressure.  Fats. ? Foods containing healthful fats, such as  nuts, avocado, olive oil, canola oil, and fish.  Does everyone with diabetes mellitus have the same meal plan? Because every person with diabetes mellitus is different, there is not one meal plan that works for everyone. It is very important that you meet with a dietitian who will help you create a meal plan that is just right for you. This information is not intended to replace advice given to you by your health care provider. Make sure you discuss any questions you have with your health care provider. Document Released: 05/13/2005 Document Revised: 01/22/2016 Document Reviewed: 07/13/2013 Elsevier Interactive Patient Education  2017 Elsevier Inc.  

## 2017-03-31 ENCOUNTER — Encounter: Payer: Medicare HMO | Attending: Physician Assistant | Admitting: *Deleted

## 2017-03-31 ENCOUNTER — Encounter: Payer: Self-pay | Admitting: *Deleted

## 2017-03-31 VITALS — BP 136/78 | Ht 65.5 in | Wt 173.9 lb

## 2017-03-31 DIAGNOSIS — Z713 Dietary counseling and surveillance: Secondary | ICD-10-CM | POA: Diagnosis not present

## 2017-03-31 DIAGNOSIS — E119 Type 2 diabetes mellitus without complications: Secondary | ICD-10-CM | POA: Diagnosis not present

## 2017-03-31 NOTE — Progress Notes (Signed)
Diabetes Self-Management Education  Visit Type: First/Initial  Appt. Start Time: 1610 Appt. End Time: 8527  03/31/2017  Ms. Christine Richards, identified by name and date of birth, is a 71 y.o. female with a diagnosis of Diabetes: Type 2.   ASSESSMENT  Blood pressure 136/78, height 5' 5.5" (1.664 m), weight 173 lb 14.4 oz (78.9 kg). Body mass index is 28.5 kg/m.      Diabetes Self-Management Education - 03/31/17 1908      Visit Information   Visit Type First/Initial     Initial Visit   Diabetes Type Type 2   Are you currently following a meal plan? Yes   What type of meal plan do you follow? "very diet conscious"   Are you taking your medications as prescribed? Yes   Date Diagnosed less than a year     Health Coping   How would you rate your overall health? Good     Psychosocial Assessment   Patient Belief/Attitude about Diabetes Motivated to manage diabetes  "not happy"   Self-care barriers None   Self-management support Doctor's office;Friends;Family   Patient Concerns Nutrition/Meal planning;Glycemic Control;Weight Control;Monitoring   Special Needs None   Preferred Learning Style Visual   Learning Readiness Change in progress   How often do you need to have someone help you when you read instructions, pamphlets, or other written materials from your doctor or pharmacy? 1 - Never   What is the last grade level you completed in school? BS     Pre-Education Assessment   Patient understands the diabetes disease and treatment process. Needs Instruction   Patient understands incorporating nutritional management into lifestyle. Needs Review   Patient undertands incorporating physical activity into lifestyle. Needs Review   Patient understands using medications safely. Needs Instruction   Patient understands monitoring blood glucose, interpreting and using results Needs Review   Patient understands prevention, detection, and treatment of acute complications. Needs Instruction    Patient understands prevention, detection, and treatment of chronic complications. Needs Review   Patient understands how to develop strategies to address psychosocial issues. Needs Instruction   Patient understands how to develop strategies to promote health/change behavior. Needs Review     Complications   Last HgB A1C per patient/outside source 7.9 %  03/10/17   How often do you check your blood sugar? 1-2 times/day   Fasting Blood glucose range (mg/dL) 130-179;180-200  FBG's 162-180's mg/dL   Postprandial Blood glucose range (mg/dL) --  Readings before meals 121-158 mg/dL   Have you had a dilated eye exam in the past 12 months? Yes   Have you had a dental exam in the past 12 months? Yes   Are you checking your feet? No     Dietary Intake   Breakfast 1 egg yolk and 3 egg whites with canadian bacon, fruit and avocado or banana with peanut butter or occasionally oats   Snack (morning) tomatoes, peanut butter crackers, Greek yogurt peanuts   Lunch salad with grilled chicken or chicken salad   Snack (afternoon) same as morning snack   Dinner grilled chicken salad or pork with peas, squash   Snack (evening) peanuts or Greek yogurt   Beverage(s) water, 1 cup coffee     Exercise   Exercise Type Moderate (swimming / aerobic walking)   How many days per week to you exercise? 5   How many minutes per day do you exercise? 60   Total minutes per week of exercise 300     Patient  Education   Previous Diabetes Education No   Disease state  Definition of diabetes, type 1 and 2, and the diagnosis of diabetes;Explored patient's options for treatment of their diabetes   Nutrition management  Role of diet in the treatment of diabetes and the relationship between the three main macronutrients and blood glucose level;Food label reading, portion sizes and measuring food.;Carbohydrate counting;Reviewed blood glucose goals for pre and post meals and how to evaluate the patients' food intake on their blood  glucose level.   Physical activity and exercise  Role of exercise on diabetes management, blood pressure control and cardiac health.   Monitoring Purpose and frequency of SMBG.;Taught/discussed recording of test results and interpretation of SMBG.;Identified appropriate SMBG and/or A1C goals.   Chronic complications Relationship between chronic complications and blood glucose control   Psychosocial adjustment Identified and addressed patients feelings and concerns about diabetes     Individualized Goals (developed by patient)   Reducing Risk Improve blood sugars Prevent diabetes complications Lose weight     Outcomes   Expected Outcomes Demonstrated interest in learning. Expect positive outcomes      Individualized Plan for Diabetes Self-Management Training:   Learning Objective:  Patient will have a greater understanding of diabetes self-management. Patient education plan is to attend individual and/or group sessions per assessed needs and concerns.   Plan:   Patient Instructions  Check blood sugars 1-2 x day before breakfast and 2 hrs after supper every day Bring blood sugar records to MD appts Exercise: Continue program  for 60  minutes  4-5 days a week Eat 3 meals day,  2 snacks a day Space meals 4-6 hours apart Call back if you want to schedule classes or an appointment with the dietitian  Expected Outcomes:  Demonstrated interest in learning. Expect positive outcomes  Education material provided:  General Meal Planning Guidelines Simple Meal Plan  If problems or questions, patient to contact team via:  Johny Drilling, RN, CCM, CDE [phone]  Future DSME appointment:  Pt to check with insurance coverage before scheduling classes or a follow up appointment with the dietitian.

## 2017-03-31 NOTE — Patient Instructions (Addendum)
Check blood sugars 1-2 x day before breakfast and 2 hrs after supper every day Bring blood sugar records to MD appts  Exercise: Continue program  for 60  minutes  4-5 days a week  Eat 3 meals day,  2 snacks a day Space meals 4-6 hours apart  Call back if you want to schedule classes or an appointment with the dietitian

## 2017-04-28 ENCOUNTER — Encounter: Payer: Self-pay | Admitting: *Deleted

## 2017-06-09 ENCOUNTER — Encounter: Payer: Self-pay | Admitting: Physician Assistant

## 2017-06-09 ENCOUNTER — Ambulatory Visit (INDEPENDENT_AMBULATORY_CARE_PROVIDER_SITE_OTHER): Payer: Medicare HMO | Admitting: Physician Assistant

## 2017-06-09 VITALS — BP 130/80 | HR 68 | Temp 97.8°F | Resp 16 | Ht 66.0 in | Wt 172.0 lb

## 2017-06-09 DIAGNOSIS — G4733 Obstructive sleep apnea (adult) (pediatric): Secondary | ICD-10-CM

## 2017-06-09 DIAGNOSIS — E119 Type 2 diabetes mellitus without complications: Secondary | ICD-10-CM | POA: Diagnosis not present

## 2017-06-09 NOTE — Progress Notes (Addendum)
Patient: Saranda Legrande. Female    DOB: 1946-03-09   71 y.o.   MRN: [id number] Visit Date: 06/09/2017  Today's Provider: Mar Daring, PA-C   Chief Complaint  Patient presents with  . Diabetes   Subjective:    HPI  Diabetes Mellitus Type II, Follow-up:   Lab Results  Component Value Date   HGBA1C 7.9 03/10/2017   HGBA1C 8.0 12/09/2016   HGBA1C 7.5 (H) 09/14/2016    Last seen for diabetes 3 months ago.  Management since then includes no changes. She reports excellent compliance with treatment. She is not having side effects.  Current symptoms include none and have been stable. Home blood sugar records: fasting range: 120's  Episodes of hypoglycemia? no   Current Insulin Regimen: none  Most Recent Eye Exam: UTD Weight trend: stable Prior visit with dietician: no Current diet: in general, a "healthy" diet   Current exercise: walking  Pertinent Labs:    Component Value Date/Time   CHOL 332 (H) 09/14/2016 0914   TRIG 220 (H) 09/14/2016 0914   HDL 46 09/14/2016 0914   LDLCALC 242 (H) 09/14/2016 0914   CREATININE 0.75 09/14/2016 0914   CREATININE 0.72 03/17/2013 1443    Wt Readings from Last 3 Encounters:  06/09/17 172 lb (78 kg)  03/31/17 173 lb 14.4 oz (78.9 kg)  03/10/17 173 lb (78.5 kg)    OSA: Patient does have sleep apnea and is currently using CPAP nightly to sleep. She reports she has had her CPAP for many years and is interested in a new machine as hers is starting to malfunction some.  ---------------------------------------------------------------------      Allergies  Allergen Reactions  . Antihistamines, Chlorpheniramine-Type     Facial swelling   . Macrobid [Nitrofurantoin Macrocrystal]     unknown  . Statins     Muscle Pain, Weakness      Current Outpatient Prescriptions:  .  Cholecalciferol (VITAMIN D) 2000 UNITS CAPS, Take 2,000 Units by mouth 2 (two) times daily. , Disp: , Rfl:  .  Omega-3 Fatty Acids (FISH OIL) 1000  MG CAPS, Take 3,000 mg by mouth at bedtime. , Disp: , Rfl:  .  ALPRAZolam (XANAX) 0.5 MG tablet, Take 1 tablet (0.5 mg total) by mouth 2 (two) times daily as needed for anxiety. (Patient not taking: Reported on 03/31/2017), Disp: 14 tablet, Rfl: 0  Review of Systems  Constitutional: Negative.   Respiratory: Negative.   Cardiovascular: Negative.   Gastrointestinal: Negative.   Endocrine: Negative.   Musculoskeletal: Negative.   Neurological: Negative.   Psychiatric/Behavioral: Negative.     Social History  Substance Use Topics  . Smoking status: Never Smoker  . Smokeless tobacco: Never Used  . Alcohol use No   Objective:   BP 130/80 (BP Location: Left Arm, Patient Position: Sitting, Cuff Size: Large)   Pulse 68   Temp 97.8 F (36.6 C) (Oral)   Resp 16   Ht 5\' 6"  (1.676 m)   Wt 172 lb (78 kg)   SpO2 98%   BMI 27.76 kg/m  Vitals:   06/09/17 1625  BP: 130/80  Pulse: 68  Resp: 16  Temp: 97.8 F (36.6 C)  TempSrc: Oral  SpO2: 98%  Weight: 172 lb (78 kg)  Height: 5\' 6"  (1.676 m)     Physical Exam  Constitutional: She appears well-developed and well-nourished. No distress.  Neck: Normal range of motion. Neck supple. No JVD present. No tracheal deviation present. No thyromegaly  present.  Cardiovascular: Normal rate, regular rhythm and normal heart sounds.  Exam reveals no gallop and no friction rub.   No murmur heard. Pulmonary/Chest: Effort normal and breath sounds normal. No respiratory distress. She has no wheezes. She has no rales.  Lymphadenopathy:    She has no cervical adenopathy.  Skin: She is not diaphoretic.  Vitals reviewed.  Diabetic Foot Exam - Simple   Simple Foot Form Diabetic Foot exam was performed with the following findings:  Yes 06/09/2017  4:05 PM  Visual Inspection No deformities, no ulcerations, no other skin breakdown bilaterally:  Yes Sensation Testing Intact to touch and monofilament testing bilaterally:  Yes Pulse Check Posterior  Tibialis and Dorsalis pulse intact bilaterally:  Yes Comments       Assessment & Plan:     1. Type 2 diabetes mellitus without complication, without long-term current use of insulin (HCC) Unable to obtain POCT A1c today due to power outage thus lab was ordered as below. I will f/u pending results. I had referred patient to nutrition counseling in the past but she ended up cancelling the referral stating she can make no other dietary changes, but still refuses medications to lower blood sugars.  - HgB A1c  2. Obstructive sleep apnea syndrome Currently using CPAP nightly. Has resolution of daytime somnolence and fatigue. Has had current machine for many years and is interested in a new machine.        Mar Daring, PA-C  [facility]

## 2017-06-14 DIAGNOSIS — E119 Type 2 diabetes mellitus without complications: Secondary | ICD-10-CM | POA: Diagnosis not present

## 2017-06-15 LAB — HEMOGLOBIN A1C
EAG (MMOL/L): 9.5 (calc)
HEMOGLOBIN A1C: 7.6 %{Hb} — AB (ref <5.7)
MEAN PLASMA GLUCOSE: 171 (calc)

## 2017-06-16 ENCOUNTER — Encounter: Payer: Self-pay | Admitting: Physician Assistant

## 2017-06-16 DIAGNOSIS — E119 Type 2 diabetes mellitus without complications: Secondary | ICD-10-CM

## 2017-06-17 DIAGNOSIS — R69 Illness, unspecified: Secondary | ICD-10-CM | POA: Diagnosis not present

## 2017-06-17 MED ORDER — ONETOUCH ULTRASOFT LANCETS MISC: 12 refills | Status: DC

## 2017-06-17 MED ORDER — GLUCOSE BLOOD VI STRP: ORAL_STRIP | 12 refills | Status: DC

## 2017-07-04 ENCOUNTER — Telehealth: Payer: Self-pay | Admitting: Physician Assistant

## 2017-07-04 NOTE — Telephone Encounter (Signed)
LMTCB

## 2017-07-04 NOTE — Telephone Encounter (Signed)
[place] with [facility] is requesting a call back to discuss the referral they received for CPAP. Thanks TNP

## 2017-07-11 NOTE — Telephone Encounter (Signed)
Christine Richards states that they need the pressures on the order for the patient, and they also need your NPI number on the order. Just FYI.

## 2017-07-12 NOTE — Telephone Encounter (Signed)
Her pressure was 7cmH2O and my NPI is [phone]. Dont know if this can be called in verbally or if we need to send in new Rx.

## 2017-07-15 NOTE — Telephone Encounter (Signed)
I spoke to [place] at [facility] to give her cpap pressure for pt.She will be sending over CMN for [facility] to sign

## 2017-07-18 NOTE — Telephone Encounter (Signed)
CMN faxed to [place] at [facility]. She states with this form she has everything needed to get CPAP

## 2017-08-09 ENCOUNTER — Telehealth: Payer: Self-pay | Admitting: Physician Assistant

## 2017-08-09 ENCOUNTER — Other Ambulatory Visit: Payer: Self-pay | Admitting: Physician Assistant

## 2017-08-09 DIAGNOSIS — R928 Other abnormal and inconclusive findings on diagnostic imaging of breast: Secondary | ICD-10-CM

## 2017-08-09 NOTE — Progress Notes (Signed)
Ordered mammogram and [place] for right breast as requested by imaging center

## 2017-08-09 NOTE — Telephone Encounter (Signed)
error 

## 2017-08-24 DIAGNOSIS — G4733 Obstructive sleep apnea (adult) (pediatric): Secondary | ICD-10-CM | POA: Diagnosis not present

## 2017-09-10 DIAGNOSIS — R69 Illness, unspecified: Secondary | ICD-10-CM | POA: Diagnosis not present

## 2017-09-15 ENCOUNTER — Ambulatory Visit (INDEPENDENT_AMBULATORY_CARE_PROVIDER_SITE_OTHER): Payer: Medicare HMO | Admitting: Physician Assistant

## 2017-09-15 ENCOUNTER — Encounter: Payer: Self-pay | Admitting: Physician Assistant

## 2017-09-15 VITALS — BP 120/80 | HR 64 | Temp 97.9°F | Resp 16 | Wt 173.6 lb

## 2017-09-15 DIAGNOSIS — E119 Type 2 diabetes mellitus without complications: Secondary | ICD-10-CM | POA: Diagnosis not present

## 2017-09-15 DIAGNOSIS — G4733 Obstructive sleep apnea (adult) (pediatric): Secondary | ICD-10-CM | POA: Diagnosis not present

## 2017-09-15 LAB — POCT GLYCOSYLATED HEMOGLOBIN (HGB A1C): Hemoglobin A1C: 8

## 2017-09-15 NOTE — Progress Notes (Addendum)
Patient: Christine Richards. Female    DOB: July 18, 1946   72 y.o.   MRN: [id number] Visit Date: 09/15/2017  Today's Provider: Mar Daring, PA-C   Chief Complaint  Patient presents with  . Diabetes   Subjective:    HPI   Diabetes Mellitus Type II, Follow-up:   Lab Results  Component Value Date   HGBA1C 8.0 09/15/2017   HGBA1C 7.6 (H) 06/14/2017   HGBA1C 7.9 03/10/2017    Last seen for diabetes 3 months ago.  Management since then includes check A1c  Current symptoms include none and have been stable. Home blood sugar records: fasting range: 150's  Episodes of hypoglycemia? no   Current Insulin Regimen: new Weight trend: stable Prior visit with dietician: yes  Current diet: in general, a "healthy" diet   Current exercise: walking  Pertinent Labs:    Component Value Date/Time   CHOL 332 (H) 09/14/2016 0914   TRIG 220 (H) 09/14/2016 0914   HDL 46 09/14/2016 0914   LDLCALC 242 (H) 09/14/2016 0914   CREATININE 0.75 09/14/2016 0914   CREATININE 0.72 03/17/2013 1443    Wt Readings from Last 3 Encounters:  09/15/17 173 lb 9.6 oz (78.7 kg)  06/09/17 172 lb (78 kg)  03/31/17 173 lb 14.4 oz (78.9 kg)  ------------------------------------------------------------------------     Allergies  Allergen Reactions  . Antihistamines, Chlorpheniramine-Type     Facial swelling   . Macrobid [Nitrofurantoin Macrocrystal]     unknown  . Statins     Muscle Pain, Weakness      Current Outpatient Medications:  .  Cholecalciferol (VITAMIN D) 2000 UNITS CAPS, Take 2,000 Units by mouth 2 (two) times daily. , Disp: , Rfl:  .  glucose blood (ONETOUCH VERIO) test strip, To check blood sugar once daily, Disp: 100 each, Rfl: 12 .  Lancets (ONETOUCH ULTRASOFT) lancets, To check blood sugar once daily, Disp: 100 each, Rfl: 12 .  Omega-3 Fatty Acids (FISH OIL) 1000 MG CAPS, Take 3,000 mg by mouth at bedtime. , Disp: , Rfl:  .  ALPRAZolam (XANAX) 0.5 MG tablet, Take 1 tablet  (0.5 mg total) by mouth 2 (two) times daily as needed for anxiety. (Patient not taking: Reported on 03/31/2017), Disp: 14 tablet, Rfl: 0  Review of Systems  Constitutional: Negative.   HENT: Negative.   Respiratory: Negative.   Cardiovascular: Negative.   Endocrine: Negative.   Neurological: Negative.     Social History   Tobacco Use  . Smoking status: Never Smoker  . Smokeless tobacco: Never Used  Substance Use Topics  . Alcohol use: No   Objective:   BP 120/80 (BP Location: Left Arm, Patient Position: Sitting, Cuff Size: Large)   Pulse 64   Temp 97.9 F (36.6 C)   Resp 16   Wt 173 lb 9.6 oz (78.7 kg)   SpO2 99%   BMI 28.02 kg/m  Vitals:   09/15/17 1635  BP: 120/80  Pulse: 64  Resp: 16  Temp: 97.9 F (36.6 C)  SpO2: 99%  Weight: 173 lb 9.6 oz (78.7 kg)     Physical Exam  Constitutional: She appears well-developed and well-nourished. No distress.  Neck: Normal range of motion. Neck supple. No JVD present. No tracheal deviation present. No thyromegaly present.  Cardiovascular: Normal rate, regular rhythm and normal heart sounds. Exam reveals no gallop and no friction rub.  No murmur heard. Pulmonary/Chest: Effort normal and breath sounds normal. No respiratory distress. She has no wheezes.  She has no rales.  Musculoskeletal: She exhibits no edema.  Lymphadenopathy:    She has no cervical adenopathy.  Skin: She is not diaphoretic.  Vitals reviewed.       Assessment & Plan:     1. Type 2 diabetes mellitus without complication, without long-term current use of insulin (HCC) A1c up to 8.0. Patient admits to not eating healthy over the holidays and her birthday. She is still wanting to try to get her sugar down on her own again. 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 I will see her back in 3  months and we will recheck. If A1c is still elevated will really need to start medications to protect her from end organ damage. She will need her CPE in 3 months.  - POCT glycosylated hemoglobin (Hb A1C)  2. Obstructive sleep apnea syndrome Patient is now using CPAP since 08/24/17. She reports excellent compliance. Feels much better and more well rested now. She is to continue use nightly.        Mar Daring, PA-C  [facility]

## 2017-09-22 ENCOUNTER — Ambulatory Visit
Admission: RE | Admit: 2017-09-22 | Discharge: 2017-09-22 | Disposition: A | Discharge status: Home / Self Care | Payer: Medicare HMO | Source: Ambulatory Visit | Attending: Physician Assistant | Admitting: Physician Assistant

## 2017-09-22 DIAGNOSIS — N6313 Unspecified lump in the right breast, lower outer quadrant: Secondary | ICD-10-CM | POA: Diagnosis not present

## 2017-09-22 DIAGNOSIS — R928 Other abnormal and inconclusive findings on diagnostic imaging of breast: Secondary | ICD-10-CM | POA: Insufficient documentation

## 2017-09-22 DIAGNOSIS — N6489 Other specified disorders of breast: Secondary | ICD-10-CM | POA: Diagnosis not present

## 2017-09-22 DIAGNOSIS — N6310 Unspecified lump in the right breast, unspecified quadrant: Secondary | ICD-10-CM | POA: Diagnosis present

## 2017-09-22 DIAGNOSIS — R922 Inconclusive mammogram: Secondary | ICD-10-CM | POA: Diagnosis not present

## 2017-09-23 ENCOUNTER — Telehealth: Payer: Self-pay

## 2017-09-23 NOTE — Telephone Encounter (Signed)
Patient advised as below.  

## 2017-09-23 NOTE — Telephone Encounter (Signed)
-----   Message from Mar Daring, [place] sent at 09/22/2017  3:27 PM EST ----- Stable nodule noted on diagnostic mammogram and [place]. Recommend short term 6 month with a right breast US for stability.

## 2017-09-24 DIAGNOSIS — G4733 Obstructive sleep apnea (adult) (pediatric): Secondary | ICD-10-CM | POA: Diagnosis not present

## 2017-10-25 DIAGNOSIS — G4733 Obstructive sleep apnea (adult) (pediatric): Secondary | ICD-10-CM | POA: Diagnosis not present

## 2017-11-20 DIAGNOSIS — R69 Illness, unspecified: Secondary | ICD-10-CM | POA: Diagnosis not present

## 2017-11-22 DIAGNOSIS — G4733 Obstructive sleep apnea (adult) (pediatric): Secondary | ICD-10-CM | POA: Diagnosis not present

## 2017-12-15 ENCOUNTER — Ambulatory Visit (INDEPENDENT_AMBULATORY_CARE_PROVIDER_SITE_OTHER): Payer: Medicare HMO | Admitting: Physician Assistant

## 2017-12-15 ENCOUNTER — Encounter: Payer: Self-pay | Admitting: Physician Assistant

## 2017-12-15 ENCOUNTER — Ambulatory Visit: Payer: Self-pay | Admitting: Physician Assistant

## 2017-12-15 ENCOUNTER — Ambulatory Visit (INDEPENDENT_AMBULATORY_CARE_PROVIDER_SITE_OTHER): Payer: Medicare HMO

## 2017-12-15 VITALS — BP 128/60 | HR 68 | Temp 98.2°F | Ht 66.0 in | Wt 173.2 lb

## 2017-12-15 DIAGNOSIS — Z1231 Encounter for screening mammogram for malignant neoplasm of breast: Secondary | ICD-10-CM

## 2017-12-15 DIAGNOSIS — E559 Vitamin D deficiency, unspecified: Secondary | ICD-10-CM

## 2017-12-15 DIAGNOSIS — E039 Hypothyroidism, unspecified: Secondary | ICD-10-CM | POA: Diagnosis not present

## 2017-12-15 DIAGNOSIS — M8589 Other specified disorders of bone density and structure, multiple sites: Secondary | ICD-10-CM

## 2017-12-15 DIAGNOSIS — Z1159 Encounter for screening for other viral diseases: Secondary | ICD-10-CM | POA: Diagnosis not present

## 2017-12-15 DIAGNOSIS — E119 Type 2 diabetes mellitus without complications: Secondary | ICD-10-CM | POA: Diagnosis not present

## 2017-12-15 DIAGNOSIS — Z8673 Personal history of transient ischemic attack (TIA), and cerebral infarction without residual deficits: Secondary | ICD-10-CM

## 2017-12-15 DIAGNOSIS — Z0000 Encounter for general adult medical examination without abnormal findings: Secondary | ICD-10-CM | POA: Diagnosis not present

## 2017-12-15 DIAGNOSIS — Z1239 Encounter for other screening for malignant neoplasm of breast: Secondary | ICD-10-CM

## 2017-12-15 DIAGNOSIS — E7800 Pure hypercholesterolemia, unspecified: Secondary | ICD-10-CM | POA: Diagnosis not present

## 2017-12-15 DIAGNOSIS — R69 Illness, unspecified: Secondary | ICD-10-CM | POA: Diagnosis not present

## 2017-12-15 DIAGNOSIS — I6523 Occlusion and stenosis of bilateral carotid arteries: Secondary | ICD-10-CM

## 2017-12-15 MED ORDER — ONETOUCH ULTRASOFT LANCETS MISC: 12 refills | Status: DC

## 2017-12-15 MED ORDER — GLUCOSE BLOOD VI STRP: ORAL_STRIP | 12 refills | Status: DC

## 2017-12-15 NOTE — Patient Instructions (Signed)
Christine Richards , Thank you for taking time to come for your Medicare Wellness Visit. I appreciate your ongoing commitment to your health goals. Please review the following plan we discussed and let me know if I can assist you in the future.   Screening recommendations/referrals: Colonoscopy: Up to date Mammogram: Up to date Bone Density: Up to date Recommended yearly ophthalmology/optometry visit for glaucoma screening and checkup Recommended yearly dental visit for hygiene and checkup  Vaccinations: Influenza vaccine: N/A Pneumococcal vaccine: Prevnar 13 completed 07/2015, declined Pneumovax 23 today. Tdap vaccine: Up to date Shingles vaccine: Pt declines today.     Advanced directives: Advance directive discussed with you today. Even though you declined this today please call our office should you change your mind and we can give you the proper paperwork for you to fill out.  Conditions/risks identified: Recommend increasing water intake to 4-6 glasses a day.   Next appointment: 3:00 PM today with Fenton Malling.   Preventive Care 23 Years and Older, Female Preventive care refers to lifestyle choices and visits with your health care provider that can promote health and wellness. What does preventive care include?  A yearly physical exam. This is also called an annual well check.  Dental exams once or twice a year.  Routine eye exams. Ask your health care provider how often you should have your eyes checked.  Personal lifestyle choices, including:  Daily care of your teeth and gums.  Regular physical activity.  Eating a healthy diet.  Avoiding tobacco and drug use.  Limiting alcohol use.  Practicing safe sex.  Taking low-dose aspirin every day.  Taking vitamin and mineral supplements as recommended by your health care provider. What happens during an annual well check? The services and screenings done by your health care provider during your annual well check will  depend on your age, overall health, lifestyle risk factors, and family history of disease. Counseling  Your health care provider may ask you questions about your:  Alcohol use.  Tobacco use.  Drug use.  Emotional well-being.  Home and relationship well-being.  Sexual activity.  Eating habits.  History of falls.  Memory and ability to understand (cognition).  Work and work Statistician.  Reproductive health. Screening  You may have the following tests or measurements:  Height, weight, and BMI.  Blood pressure.  Lipid and cholesterol levels. These may be checked every 5 years, or more frequently if you are over 42 years old.  Skin check.  Lung cancer screening. You may have this screening every year starting at age 15 if you have a 30-pack-year history of smoking and currently smoke or have quit within the past 15 years.  Fecal occult blood test (FOBT) of the stool. You may have this test every year starting at age 46.  Flexible sigmoidoscopy or colonoscopy. You may have a sigmoidoscopy every 5 years or a colonoscopy every 10 years starting at age 76.  Hepatitis C blood test.  Hepatitis B blood test.  Sexually transmitted disease (STD) testing.  Diabetes screening. This is done by checking your blood sugar (glucose) after you have not eaten for a while (fasting). You may have this done every 1-3 years.  Bone density scan. This is done to screen for osteoporosis. You may have this done starting at age 28.  Mammogram. This may be done every 1-2 years. Talk to your health care provider about how often you should have regular mammograms. Talk with your health care provider about your test results,  treatment options, and if necessary, the need for more tests. Vaccines  Your health care provider may recommend certain vaccines, such as:  Influenza vaccine. This is recommended every year.  Tetanus, diphtheria, and acellular pertussis (Tdap, Td) vaccine. You may need a  Td booster every 10 years.  Zoster vaccine. You may need this after age 52.  Pneumococcal 13-valent conjugate (PCV13) vaccine. One dose is recommended after age 68.  Pneumococcal polysaccharide (PPSV23) vaccine. One dose is recommended after age 14. Talk to your health care provider about which screenings and vaccines you need and how often you need them. This information is not intended to replace advice given to you by your health care provider. Make sure you discuss any questions you have with your health care provider. Document Released: 09/12/2015 Document Revised: 05/05/2016 Document Reviewed: 06/17/2015 Elsevier Interactive Patient Education  2017 [employer] Prevention in the Home Falls can cause injuries. They can happen to people of all ages. There are many things you can do to make your home safe and to help prevent falls. What can I do on the outside of my home?  Regularly fix the edges of walkways and driveways and fix any cracks.  Remove anything that might make you trip as you walk through a door, such as a raised step or threshold.  Trim any bushes or trees on the path to your home.  Use bright outdoor lighting.  Clear any walking paths of anything that might make someone trip, such as rocks or tools.  Regularly check to see if handrails are loose or broken. Make sure that both sides of any steps have handrails.  Any raised decks and porches should have guardrails on the edges.  Have any leaves, snow, or ice cleared regularly.  Use sand or salt on walking paths during winter.  Clean up any spills in your garage right away. This includes oil or grease spills. What can I do in the bathroom?  Use night lights.  Install grab bars by the toilet and in the tub and shower. Do not use towel bars as grab bars.  Use non-skid mats or decals in the tub or shower.  If you need to sit down in the shower, use a plastic, non-slip stool.  Keep the floor dry. Clean  up any water that spills on the floor as soon as it happens.  Remove soap buildup in the tub or shower regularly.  Attach bath mats securely with double-sided non-slip rug tape.  Do not have throw rugs and other things on the floor that can make you trip. What can I do in the bedroom?  Use night lights.  Make sure that you have a light by your bed that is easy to reach.  Do not use any sheets or blankets that are too big for your bed. They should not hang down onto the floor.  Have a firm chair that has side arms. You can use this for support while you get dressed.  Do not have throw rugs and other things on the floor that can make you trip. What can I do in the kitchen?  Clean up any spills right away.  Avoid walking on wet floors.  Keep items that you use a lot in easy-to-reach places.  If you need to reach something above you, use a strong step stool that has a grab bar.  Keep electrical cords out of the way.  Do not use floor polish or wax that makes floors  slippery. If you must use wax, use non-skid floor wax.  Do not have throw rugs and other things on the floor that can make you trip. What can I do with my stairs?  Do not leave any items on the stairs.  Make sure that there are handrails on both sides of the stairs and use them. Fix handrails that are broken or loose. Make sure that handrails are as long as the stairways.  Check any carpeting to make sure that it is firmly attached to the stairs. Fix any carpet that is loose or worn.  Avoid having throw rugs at the top or bottom of the stairs. If you do have throw rugs, attach them to the floor with carpet tape.  Make sure that you have a light switch at the top of the stairs and the bottom of the stairs. If you do not have them, ask someone to add them for you. What else can I do to help prevent falls?  Wear shoes that:  Do not have high heels.  Have rubber bottoms.  Are comfortable and fit you well.  Are  closed at the toe. Do not wear sandals.  If you use a stepladder:  Make sure that it is fully opened. Do not climb a closed stepladder.  Make sure that both sides of the stepladder are locked into place.  Ask someone to hold it for you, if possible.  Clearly mark and make sure that you can see:  Any grab bars or handrails.  First and last steps.  Where the edge of each step is.  Use tools that help you move around (mobility aids) if they are needed. These include:  Canes.  Walkers.  Scooters.  Crutches.  Turn on the lights when you go into a dark area. Replace any light bulbs as soon as they burn out.  Set up your furniture so you have a clear path. Avoid moving your furniture around.  If any of your floors are uneven, fix them.  If there are any pets around you, be aware of where they are.  Review your medicines with your doctor. Some medicines can make you feel dizzy. This can increase your chance of falling. Ask your doctor what other things that you can do to help prevent falls. This information is not intended to replace advice given to you by your health care provider. Make sure you discuss any questions you have with your health care provider. Document Released: 06/12/2009 Document Revised: 01/22/2016 Document Reviewed: 09/20/2014 Elsevier Interactive Patient Education  2017 [employer].

## 2017-12-15 NOTE — Progress Notes (Signed)
Patient: Christine Figge., Female    DOB: 08-27-1946, 72 y.o.   MRN: [id number] Visit Date: 12/15/2017  Today's Provider: Mar Daring, PA-C   Chief Complaint  Patient presents with  . Annual Exam  . Diabetes   Subjective:  Patient saw McKenzie today at 2:20pm.   Annual wellness visit Christine Wall. is a 72 y.o. female. She feels well. She reports exercising occasionally. She reports she is sleeping well.  Mammogram- 09/22/2017. Stable. Repeat in 6 months.-Followed by Dr. Bary Castilla Colonoscopy- 12/15/2011. Diverticulosis-Dr. Candace Cruise. Repeat in 5 years reported (however due to no polyps and no family history was told to wait 10).   Diabetes Mellitus Type II, Follow-up:   Lab Results  Component Value Date   HGBA1C 8.0 09/15/2017   HGBA1C 7.6 (H) 06/14/2017   HGBA1C 7.9 03/10/2017    Last seen for diabetes 3 months ago.  Management since then includes no changes. However, it was mentioned if BS not better at follow up, and additional medication will be needed.  She reports good compliance with treatment. She is not having side effects.  Current symptoms include none and have been stable. Home blood sugar records: trend: stable  Episodes of hypoglycemia? no   Current Insulin Regimen: none Most Recent Eye Exam: due Weight trend: stable Prior visit with dietician: no Current diet: well balanced Current exercise: no regular exercise  Pertinent Labs:    Component Value Date/Time   CHOL 332 (H) 09/14/2016 0914   TRIG 220 (H) 09/14/2016 0914   HDL 46 09/14/2016 0914   LDLCALC 242 (H) 09/14/2016 0914   CREATININE 0.75 09/14/2016 0914   CREATININE 0.72 03/17/2013 1443    Wt Readings from Last 3 Encounters:  12/15/17 173 lb 3.2 oz (78.6 kg)  09/15/17 173 lb 9.6 oz (78.7 kg)  06/09/17 172 lb (78 kg)     Review of Systems  Constitutional: Negative.   HENT: Negative.   Eyes: Negative.   Respiratory: Negative.   Cardiovascular: Negative.   Gastrointestinal:  Negative.   Endocrine: Negative.   Genitourinary: Negative.   Musculoskeletal: Positive for arthralgias and back pain.  Skin: Negative.   Allergic/Immunologic: Negative.   Neurological: Negative.   Hematological: Negative.   Psychiatric/Behavioral: Negative.     Social History   Socioeconomic History  . Marital status: Widowed    Spouse name: Not on file  . Number of children: 2  . Years of education: College  . Highest education level: Bachelor's degree (e.g., BA, AB, BS)  Occupational History  . Occupation: [employer]: Full Time  Social Needs  . Financial resource strain: Not hard at all  . Food insecurity:    Worry: Never true    Inability: Never true  . Transportation needs:    Medical: No    Non-medical: No  Tobacco Use  . Smoking status: Never Smoker  . Smokeless tobacco: Never Used  Substance and Sexual Activity  . Alcohol use: No  . Drug use: No  . Sexual activity: Not on file  Lifestyle  . Physical activity:    Days per week: Not on file    Minutes per session: Not on file  . Stress: To some extent  Relationships  . Social connections:    Talks on phone: Not on file    Gets together: Not on file    Attends religious service: Not on file    Active member of club or  organization: Not on file    Attends meetings of clubs or organizations: Not on file    Relationship status: Not on file  . Intimate partner violence:    Fear of current or ex partner: Not on file    Emotionally abused: Not on file    Physically abused: Not on file    Forced sexual activity: Not on file  Other Topics Concern  . Not on file  Social History Narrative  . Not on file    Past Medical History:  Diagnosis Date  . Arthritis    rheumatoid  . Diabetes mellitus without complication ([place])   . Diverticulosis   . Environmental and seasonal allergies   . GERD (gastroesophageal reflux disease)   . Hyperlipidemia   . Osteopenia   . Sinus  congestion   . Sleep apnea    uses CPAP  . Stroke [facility])    TIA 6 years ago no issues since  . TIA (transient ischemic attack)      Patient Active Problem List   Diagnosis Date Noted  . Heartburn   . Gastritis   . Situational anxiety 11/20/2015  . Arthritis 02/11/2015  . Allergic rhinitis 12/25/2014  . Aneurysm ([place]) 12/25/2014  . 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 Controlled diabetes mellitus type II without complication ([place]) 96/11/5407  . Aerophobia 12/25/2014  . Fibrositis 12/25/2014  . Headache, migraine 12/25/2014  . Osteopenia 12/25/2014  . Avitaminosis D 12/25/2014  . 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 Acquired hypothyroidism 04/25/2009  . Hypercholesteremia 02/20/2007  . 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 Apnea, sleep 12/02/2006    Past Surgical History:  Procedure Laterality Date  . BREAST BIOPSY Right 2010   benign  . BREAST CYST ASPIRATION Right   . BREAST SURGERY Right 2010   biopsy  . CATARACT EXTRACTION W/PHACO Right 11/11/2016   Procedure: CATARACT EXTRACTION PHACO AND INTRAOCULAR LENS PLACEMENT (IOC);  Surgeon: Eulogio Bear, MD;  Location: [facility] ORS;  Service: Ophthalmology;  Laterality: Right;  [place] 1:04.9 AP% 14.6 CDE 9.64 Fluid pack lot # [id number]  . ESOPHAGOGASTRODUODENOSCOPY (EGD) WITH PROPOFOL N/A 12/29/2015   Procedure: ESOPHAGOGASTRODUODENOSCOPY (EGD) WITH PROPOFOL;  Surgeon: Lucilla Lame, MD;  Location: [facility];  Service: Endoscopy;  Laterality: N/A;  GASTRI BIOPSY  . TUBAL LIGATION      Her family history includes Atrial fibrillation in her sister; Congestive Heart Failure in her mother; Dementia in her father; Heart attack in her father and mother; Heart disease in her brother; Hyperlipidemia in her sister; Prostate cancer in her  father.      Current Outpatient Medications:  .  ALPRAZolam (XANAX) 0.5 MG tablet, Take 1 tablet (0.5 mg total) by mouth 2 (two) times daily as needed for anxiety. (Patient not taking: Reported on 03/31/2017), Disp: 14 tablet, Rfl: 0 .  Cholecalciferol (VITAMIN D) 2000 UNITS CAPS, Take 2,000 Units by mouth 2 (two) times daily. , Disp: , Rfl:  .  glucose blood (ONETOUCH VERIO) test strip, To check blood sugar once daily, Disp: 100 each, Rfl: 12 .  Lancets (ONETOUCH ULTRASOFT) lancets, To check blood sugar once daily, Disp: 100 each, Rfl: 12 .  Omega-3 Fatty Acids (FISH OIL) 1000 MG CAPS, Take 3,000 mg by mouth at bedtime. , Disp: , Rfl:   Patient Care Team: Rubye Beach as PCP -  General (Family Medicine) Arelia Sneddon, OD as Consulting Physician (Optometry)     Objective:    Vitals: BP 128/60 (BP Location: Right Arm)    Pulse 68    Temp 98.2 F (36.8 C) (Oral)    Ht 5\' 6"  (1.676 m)    Wt 173 lb 3.2 oz (78.6 kg)    BMI 27.96 kg/m    BSA 1.91 m  Physical Exam  Constitutional: She is oriented to person, place, and time. She appears well-developed and well-nourished. No distress.  HENT:  Head: Normocephalic and atraumatic.  Right Ear: Hearing, tympanic membrane, external ear and ear canal normal.  Left Ear: Hearing, tympanic membrane, external ear and ear canal normal.  Nose: Nose normal.  Mouth/Throat: Uvula is midline, oropharynx is clear and moist and mucous membranes are normal. No oropharyngeal exudate.  Eyes: Pupils are equal, round, and reactive to light. Conjunctivae and EOM are normal. Right eye exhibits no discharge. Left eye exhibits no discharge. No scleral icterus.  Neck: Normal range of motion. Neck supple. No JVD present. Carotid bruit is not present. No tracheal deviation present. No thyromegaly present.  Cardiovascular: Normal rate, regular rhythm, normal heart sounds and intact distal pulses. Exam reveals no gallop and no friction rub.  No  murmur heard. Pulmonary/Chest: Effort normal and breath sounds normal. No respiratory distress. She has no wheezes. She has no rales. She exhibits no tenderness.  Abdominal: Soft. Bowel sounds are normal. She exhibits no distension and no mass. There is no tenderness. There is no rebound and no guarding.  Musculoskeletal: Normal range of motion. She exhibits no edema or tenderness.  Lymphadenopathy:    She has no cervical adenopathy.  Neurological: She is alert and oriented to person, place, and time.  Skin: Skin is warm and dry. No rash noted. She is not diaphoretic.  Psychiatric: She has a normal mood and affect. Her behavior is normal. Judgment and thought content normal.  Vitals reviewed.   Activities of Daily Living In your present state of health, do you have any difficulty performing the following activities: 12/15/2017 03/10/2017  Hearing? Y N  Comment Has seen an audiologist for this in the past. Does not wear hearing aids currently.  -  Vision? N N  Difficulty concentrating or making decisions? N N  Walking or climbing stairs? N N  Dressing or bathing? N N  Doing errands, shopping? N -  Preparing Food and eating ? N -  Using the Toilet? N -  In the past six months, have you accidently leaked urine? N -  Do you have problems with loss of bowel control? N -  Managing your Medications? N -  Managing your Finances? N -  Housekeeping or managing your Housekeeping? N -  Some recent data might be hidden    Fall Risk Assessment Fall Risk  12/15/2017 03/31/2017 12/09/2016 08/06/2015 02/11/2015  Falls in the past year? No No No No No     Depression Screen PHQ 2/9 Scores 12/15/2017 03/31/2017 03/10/2017 12/09/2016  PHQ - 2 Score 0 0 1 0  PHQ- 9 Score - - - 0    Cognitive Testing - 6-CIT   Cognitive Function: Pt declined screening today.     Assessment & Plan:     Annual Wellness Visit  Reviewed patient's Family Medical History Reviewed and updated list of patient's medical  providers Assessment of cognitive impairment was done Assessed patient's functional ability Established a written schedule for health screening [place] Completed and  Reviewed  Exercise Activities and Dietary recommendations Goals    . DIET - INCREASE WATER INTAKE     Recommend increasing water intake to 4-6 glasses a day.        Immunization History  Administered Date(s) Administered  . Pneumococcal Conjugate-13 08/06/2015  . Td 12/22/2007  . Tdap 12/22/2007    Health Maintenance  Topic Date Due  . Hepatitis C Screening  17-Feb-1946  . PNA vac Low Risk Adult (2 of 2 - PPSV23) 08/05/2016  . URINE MICROALBUMIN  12/09/2017  . INFLUENZA VACCINE  05/18/2018 (Originally 03/30/2018)  . TETANUS/TDAP  12/21/2017  . OPHTHALMOLOGY EXAM  01/28/2018  . HEMOGLOBIN A1C  03/15/2018  . FOOT EXAM  06/09/2018  . MAMMOGRAM  01/12/2019  . COLONOSCOPY  12/14/2021  . DEXA SCAN  Completed     Discussed health benefits of physical activity, and encouraged her to engage in regular exercise appropriate for her age and condition.   1. Annual physical exam Normal physical exam today. Will check labs as below and f/u pending lab results. If labs are stable and WNL she will not need to have these rechecked for one year at her next annual physical exam. She is to call the office in the meantime if she has any acute issue, questions or concerns.  2. Breast cancer screening 6 month interval [place] of right breast will be due in June 2019. Had abnormal mammogram in January 2019.   3. Acquired hypothyroidism Asymptomatic. Will check labs as below and f/u pending results. - CBC w/Diff/Platelet - Comprehensive Metabolic Panel (CMET) - TSH - Lipid Profile - HgB A1c  4. Controlled type 2 diabetes mellitus without complication, without long-term current use of insulin ([place]) Patient has previously declined medications for diabetes and tried to control best she can with lifestyle  modifications. Reports sugars are averaging 145 since last seen. Has cut out desserts. I will f/u pending lab results. I will see her back in 3 months to recheck.  - CBC w/Diff/Platelet - Comprehensive Metabolic Panel (CMET) - TSH - Lipid Profile - HgB A1c - glucose blood (ONETOUCH VERIO) test strip; To check BS BID  Dispense: 200 each; Refill: 12 - Lancets (ONETOUCH ULTRASOFT) lancets; To check blood sugar BID  Dispense: 200 each; Refill: 12  5. Osteopenia of multiple sites H/O this. Last BMD was in 2015. Patient declines repeat screening at this time. Will continue conservative measures with weight bearing exercises, weight training, yoga and calcium + Vit D. Will check labs as below and f/u pending results. - CBC w/Diff/Platelet - Comprehensive Metabolic Panel (CMET) - TSH - Vitamin D (25 hydroxy)  6. Avitaminosis D On supplementation. Postmenopausal and osteopenic. Will check labs as below and f/u pending results. - CBC w/Diff/Platelet - Comprehensive Metabolic Panel (CMET) - TSH - Lipid Profile - HgB A1c - Vitamin D (25 hydroxy)  7. Hypercholesteremia Diet controlled. Will check labs as below and f/u pending results. - CBC w/Diff/Platelet - Comprehensive Metabolic Panel (CMET) - TSH - Lipid Profile - HgB A1c  8. Personal history of TIA (transient ischemic attack) No residual defects. - CBC w/Diff/Platelet - Comprehensive Metabolic Panel (CMET) - TSH - Lipid Profile - HgB A1c - US Carotid Duplex Bilateral; Future  9. Bilateral carotid artery stenosis Reports had [place] over 6 years ago that showed 50% stenosis without repeat imaging done. No symptoms. Obtaining for screening purposes due to history of TIA and known atherosclerosis.  - US Carotid Duplex Bilateral; Future  10. Need for hepatitis  C screening test  - Hepatitis C Antibody   Mar Daring, PA-C  [facility]

## 2017-12-15 NOTE — Patient Instructions (Signed)
Health Maintenance for Postmenopausal Women Menopause is a normal process in which your reproductive ability comes to an end. This process happens gradually over a span of months to years, usually between the ages of 22 and 9. Menopause is complete when you have missed 12 consecutive menstrual periods. It is important to talk with your health care provider about some of the most common conditions that affect postmenopausal women, such as heart disease, cancer, and bone loss (osteoporosis). Adopting a healthy lifestyle and getting preventive care can help to promote your health and wellness. Those actions can also lower your chances of developing some of these common conditions. What should I know about menopause? During menopause, you may experience a number of symptoms, such as:  Moderate-to-severe hot flashes.  Night sweats.  Decrease in sex drive.  Mood swings.  Headaches.  Tiredness.  Irritability.  Memory problems.  Insomnia.  Choosing to treat or not to treat menopausal changes is an individual decision that you make with your health care provider. What should I know about hormone replacement therapy and supplements? Hormone therapy products are effective for treating symptoms that are associated with menopause, such as hot flashes and night sweats. Hormone replacement carries certain risks, especially as you become older. If you are thinking about using estrogen or estrogen with progestin treatments, discuss the benefits and risks with your health care provider. What should I know about heart disease and stroke? Heart disease, heart attack, and stroke become more likely as you age. This may be due, in part, to the hormonal changes that your body experiences during menopause. These can affect how your body processes dietary fats, triglycerides, and cholesterol. Heart attack and stroke are both medical emergencies. There are many things that you can do to help prevent heart disease  and stroke:  Have your blood pressure checked at least every 1-2 years. High blood pressure causes heart disease and increases the risk of stroke.  If you are 53-22 years old, ask your health care provider if you should take aspirin to prevent a heart attack or a stroke.  Do not use any tobacco products, including cigarettes, chewing tobacco, or electronic cigarettes. If you need help quitting, ask your health care provider.  It is important to eat a healthy diet and maintain a healthy weight. ? Be sure to include plenty of vegetables, fruits, low-fat dairy products, and lean protein. ? Avoid eating foods that are high in solid fats, added sugars, or salt (sodium).  Get regular exercise. This is one of the most important things that you can do for your health. ? Try to exercise for at least 150 minutes each week. The type of exercise that you do should increase your heart rate and make you sweat. This is known as moderate-intensity exercise. ? Try to do strengthening exercises at least twice each week. Do these in addition to the moderate-intensity exercise.  Know your numbers.Ask your health care provider to check your cholesterol and your blood glucose. Continue to have your blood tested as directed by your health care provider.  What should I know about cancer screening? There are several types of cancer. Take the following steps to reduce your risk and to catch any cancer development as early as possible. Breast Cancer  Practice breast self-awareness. ? This means understanding how your breasts normally appear and feel. ? It also means doing regular breast self-exams. Let your health care provider know about any changes, no matter how small.  If you are 40  or older, have a clinician do a breast exam (clinical breast exam or CBE) every year. Depending on your age, family history, and medical history, it may be recommended that you also have a yearly breast X-ray (mammogram).  If you  have a family history of breast cancer, talk with your health care provider about genetic screening.  If you are at high risk for breast cancer, talk with your health care provider about having an MRI and a mammogram every year.  Breast cancer (BRCA) gene test is recommended for women who have family members with BRCA-related cancers. Results of the assessment will determine the need for genetic counseling and BRCA1 and for BRCA2 testing. BRCA-related cancers include these types: ? Breast. This occurs in males or females. ? Ovarian. ? Tubal. This may also be called fallopian tube cancer. ? Cancer of the abdominal or pelvic lining (peritoneal cancer). ? Prostate. ? Pancreatic.  Cervical, Uterine, and Ovarian Cancer Your health care provider may recommend that you be screened regularly for cancer of the pelvic organs. These include your ovaries, uterus, and vagina. This screening involves a pelvic exam, which includes checking for microscopic changes to the surface of your cervix (Pap test).  For women ages 21-65, health care providers may recommend a pelvic exam and a Pap test every three years. For women ages 79-65, they may recommend the Pap test and pelvic exam, combined with testing for human papilloma virus (HPV), every five years. Some types of HPV increase your risk of cervical cancer. Testing for HPV may also be done on women of any age who have unclear Pap test results.  Other health care providers may not recommend any screening for nonpregnant women who are considered low risk for pelvic cancer and have no symptoms. Ask your health care provider if a screening pelvic exam is right for you.  If you have had past treatment for cervical cancer or a condition that could lead to cancer, you need Pap tests and screening for cancer for at least 20 years after your treatment. If Pap tests have been discontinued for you, your risk factors (such as having a new sexual partner) need to be  reassessed to determine if you should start having screenings again. Some women have medical problems that increase the chance of getting cervical cancer. In these cases, your health care provider may recommend that you have screening and Pap tests more often.  If you have a family history of uterine cancer or ovarian cancer, talk with your health care provider about genetic screening.  If you have vaginal bleeding after reaching menopause, tell your health care provider.  There are currently no reliable tests available to screen for ovarian cancer.  Lung Cancer Lung cancer screening is recommended for adults 69-62 years old who are at high risk for lung cancer because of a history of smoking. A yearly low-dose CT scan of the lungs is recommended if you:  Currently smoke.  Have a history of at least 30 pack-years of smoking and you currently smoke or have quit within the past 15 years. A pack-year is smoking an average of one pack of cigarettes per day for one year.  Yearly screening should:  Continue until it has been 15 years since you quit.  Stop if you develop a health problem that would prevent you from having lung cancer treatment.  Colorectal Cancer  This type of cancer can be detected and can often be prevented.  Routine colorectal cancer screening usually begins at  age 42 and continues through age 45.  If you have risk factors for colon cancer, your health care provider may recommend that you be screened at an earlier age.  If you have a family history of colorectal cancer, talk with your health care provider about genetic screening.  Your health care provider may also recommend using home test kits to check for hidden blood in your stool.  A small camera at the end of a tube can be used to examine your colon directly (sigmoidoscopy or colonoscopy). This is done to check for the earliest forms of colorectal cancer.  Direct examination of the colon should be repeated every  5-10 years until age 71. However, if early forms of precancerous polyps or small growths are found or if you have a family history or genetic risk for colorectal cancer, you may need to be screened more often.  Skin Cancer  Check your skin from head to toe regularly.  Monitor any moles. Be sure to tell your health care provider: ? About any new moles or changes in moles, especially if there is a change in a mole's shape or color. ? If you have a mole that is larger than the size of a pencil eraser.  If any of your family members has a history of skin cancer, especially at a young age, talk with your health care provider about genetic screening.  Always use sunscreen. Apply sunscreen liberally and repeatedly throughout the day.  Whenever you are outside, protect yourself by wearing long sleeves, pants, a wide-brimmed hat, and sunglasses.  What should I know about osteoporosis? Osteoporosis is a condition in which bone destruction happens more quickly than new bone creation. After menopause, you may be at an increased risk for osteoporosis. To help prevent osteoporosis or the bone fractures that can happen because of osteoporosis, the following is recommended:  If you are 46-71 years old, get at least 1,000 mg of calcium and at least 600 mg of vitamin D per day.  If you are older than age 55 but younger than age 65, get at least 1,200 mg of calcium and at least 600 mg of vitamin D per day.  If you are older than age 54, get at least 1,200 mg of calcium and at least 800 mg of vitamin D per day.  Smoking and excessive alcohol intake increase the risk of osteoporosis. Eat foods that are rich in calcium and vitamin D, and do weight-bearing exercises several times each week as directed by your health care provider. What should I know about how menopause affects my mental health? Depression may occur at any age, but it is more common as you become older. Common symptoms of depression  include:  Low or sad mood.  Changes in sleep patterns.  Changes in appetite or eating patterns.  Feeling an overall lack of motivation or enjoyment of activities that you previously enjoyed.  Frequent crying spells.  Talk with your health care provider if you think that you are experiencing depression. What should I know about immunizations? It is important that you get and maintain your immunizations. These include:  Tetanus, diphtheria, and pertussis (Tdap) booster vaccine.  Influenza every year before the flu season begins.  Pneumonia vaccine.  Shingles vaccine.  Your health care provider may also recommend other immunizations. This information is not intended to replace advice given to you by your health care provider. Make sure you discuss any questions you have with your health care provider. Document Released: 10/08/2005  Document Revised: 03/05/2016 Document Reviewed: 05/20/2015 Elsevier Interactive Patient Education  2018 Elsevier Inc[employer]  

## 2017-12-15 NOTE — Progress Notes (Signed)
Subjective:   Christine Richards is a 72 y.o. female who presents for Medicare Annual (Subsequent) preventive examination.  Review of Systems:  N/A  Cardiac Risk Factors include: advanced age (>43men, >72 women);diabetes mellitus;dyslipidemia     Objective:     Vitals: BP 128/60 (BP Location: Right Arm)   Pulse 68   Temp 98.2 F (36.8 C) (Oral)   Ht 5\' 6"  (1.676 m)   Wt 173 lb 3.2 oz (78.6 kg)   BMI 27.96 kg/m   Body mass index is 27.96 kg/m.  Advanced Directives 12/15/2017 03/31/2017 12/09/2016 12/29/2015 11/20/2015 10/23/2015 08/06/2015  Does Patient Have a Medical Advance Directive? No No No No No No No  Would patient like information on creating a medical advance directive? No - Patient declined No - Patient declined No - Patient declined No - patient declined information - - -    Tobacco Social History   Tobacco Use  Smoking Status Never Smoker  Smokeless Tobacco Never Used     Counseling given: Not Answered   Clinical Intake:  Pre-visit preparation completed: Yes  Pain : No/denies pain Pain Score: 0-No pain     Nutritional Status: BMI 25 -29 Overweight Nutritional Risks: None Diabetes: Yes(type 2) CBG done?: No Did pt. bring in CBG monitor from home?: No  How often do you need to have someone help you when you read instructions, pamphlets, or other written materials from your doctor or pharmacy?: 1 - Never  Interpreter Needed?: No  Information entered by :: [facility], LPN  Past Medical History:  Diagnosis Date  . Arthritis    rheumatoid  . Diabetes mellitus without complication ([place])   . Diverticulosis   . Environmental and seasonal allergies   . GERD (gastroesophageal reflux disease)   . Hyperlipidemia   . Osteopenia   . Sinus congestion   . Sleep apnea    uses CPAP  . Stroke [facility])    TIA 6 years ago no issues since  . TIA (transient ischemic attack)    Past Surgical History:  Procedure Laterality Date  . BREAST BIOPSY Right 2010   benign    . BREAST CYST ASPIRATION Right   . BREAST SURGERY Right 2010   biopsy  . CATARACT EXTRACTION W/PHACO Right 11/11/2016   Procedure: CATARACT EXTRACTION PHACO AND INTRAOCULAR LENS PLACEMENT (IOC);  Surgeon: Eulogio Bear, MD;  Location: [facility] ORS;  Service: Ophthalmology;  Laterality: Right;  [place] 1:04.9 AP% 14.6 CDE 9.64 Fluid pack lot # [id number]  . ESOPHAGOGASTRODUODENOSCOPY (EGD) WITH PROPOFOL N/A 12/29/2015   Procedure: ESOPHAGOGASTRODUODENOSCOPY (EGD) WITH PROPOFOL;  Surgeon: Lucilla Lame, MD;  Location: [place];  Service: Endoscopy;  Laterality: N/A;  GASTRI BIOPSY  . TUBAL LIGATION     Family History  Problem Relation Age of Onset  . Congestive Heart Failure Mother   . Heart attack Mother   . Dementia Father   . Heart attack Father   . Prostate cancer Father   . Atrial fibrillation Sister   . Hyperlipidemia Sister   . Heart disease Brother    Social History   Socioeconomic History  . Marital status: Widowed    Spouse name: Not on file  . Number of children: 2  . Years of education: College  . Highest education level: Bachelor's degree (e.g., BA, AB, BS)  Occupational History  . Occupation: [employer]: Full Time  Social Needs  . Financial resource strain: Not hard at all  .  Food insecurity:    Worry: Never true    Inability: Never true  . Transportation needs:    Medical: No    Non-medical: No  Tobacco Use  . Smoking status: Never Smoker  . Smokeless tobacco: Never Used  Substance and Sexual Activity  . Alcohol use: No  . Drug use: No  . Sexual activity: Not on file  Lifestyle  . Physical activity:    Days per week: Not on file    Minutes per session: Not on file  . Stress: To some extent  Relationships  . Social connections:    Talks on phone: Not on file    Gets together: Not on file    Attends religious service: Not on file    Active member of club or organization: Not on file    Attends meetings of clubs  or organizations: Not on file    Relationship status: Not on file  Other Topics Concern  . Not on file  Social History Narrative  . Not on file    Outpatient Encounter Medications as of 12/15/2017  Medication Sig  . Cholecalciferol (VITAMIN D) 2000 UNITS CAPS Take 2,000 Units by mouth 2 (two) times daily.   Marland Kitchen glucose blood (ONETOUCH VERIO) test strip To check blood sugar once daily  . Lancets (ONETOUCH ULTRASOFT) lancets To check blood sugar once daily  . Omega-3 Fatty Acids (FISH OIL) 1000 MG CAPS Take 3,000 mg by mouth at bedtime.   . ALPRAZolam (XANAX) 0.5 MG tablet Take 1 tablet (0.5 mg total) by mouth 2 (two) times daily as needed for anxiety. (Patient not taking: Reported on 03/31/2017)   No facility-administered encounter medications on file as of 12/15/2017.     Activities of Daily Living In your present state of health, do you have any difficulty performing the following activities: 12/15/2017 03/10/2017  Hearing? Y N  Comment Has seen an audiologist for this in the past. Does not wear hearing aids currently.  -  Vision? N N  Difficulty concentrating or making decisions? N N  Walking or climbing stairs? N N  Dressing or bathing? N N  Doing errands, shopping? N -  Preparing Food and eating ? N -  Using the Toilet? N -  In the past six months, have you accidently leaked urine? N -  Do you have problems with loss of bowel control? N -  Managing your Medications? N -  Managing your Finances? N -  Housekeeping or managing your Housekeeping? N -  Some recent data might be hidden    Patient Care Team: Rubye Beach as PCP - General (Family Medicine) Arelia Sneddon, OD as Consulting Physician (Optometry)    Assessment:   This is a routine wellness examination for Christine Richards.  Exercise Activities and Dietary recommendations Current Exercise Habits: Structured exercise class, Type of exercise: strength training/weights;stretching;treadmill;walking, Time (Minutes):  60, Frequency (Times/Week): 4(to 5 days a week), Weekly Exercise (Minutes/Week): 240, Intensity: Moderate, Exercise limited by: None identified  Goals    . DIET - INCREASE WATER INTAKE     Recommend increasing water intake to 4-6 glasses a day.        Fall Risk Fall Risk  12/15/2017 03/31/2017 12/09/2016 08/06/2015 02/11/2015  Falls in the past year? No No No No No   Is the patient's home free of loose throw rugs in walkways, pet beds, electrical cords, etc?   yes      Grab bars in the bathroom? no  Handrails on the stairs?   yes      Adequate lighting?   yes  Timed Get Up and Go performed: N/A  Depression Screen PHQ 2/9 Scores 12/15/2017 03/31/2017 03/10/2017 12/09/2016  PHQ - 2 Score 0 0 1 0  PHQ- 9 Score - - - 0     Cognitive Function: Pt declined screening today.      6CIT Screen 12/09/2016  What Year? 0 points  What month? 0 points  What time? 0 points  Count back from 20 0 points  Months in reverse 0 points  Repeat phrase 0 points  Total Score 0    Immunization History  Administered Date(s) Administered  . Pneumococcal Conjugate-13 08/06/2015  . Td 12/22/2007  . Tdap 12/22/2007    Qualifies for Shingles Vaccine? Due for Shingles vaccine. Declined my offer to administer today. Education has been provided regarding the importance of this vaccine. Pt has been advised to call her insurance company to determine her out of pocket expense. Advised she may also receive this vaccine at her local pharmacy or Health Dept. Verbalized acceptance and understanding.  Screening Tests Health Maintenance  Topic Date Due  . Hepatitis C Screening  [birth year]  . PNA vac Low Risk Adult (2 of 2 - PPSV23) 08/05/2016  . URINE MICROALBUMIN  12/09/2017  . INFLUENZA VACCINE  05/18/2018 (Originally 03/30/2018)  . TETANUS/TDAP  12/21/2017  . OPHTHALMOLOGY EXAM  01/28/2018  . HEMOGLOBIN A1C  03/15/2018  . FOOT EXAM  06/09/2018  . MAMMOGRAM  01/12/2019  . COLONOSCOPY  12/14/2021  . DEXA SCAN   Completed    Cancer Screenings: Lung: Low Dose CT Chest recommended if Age 2-80 years, 30 pack-year currently smoking OR have quit w/in 15years. Patient does not qualify. Breast:  Up to date on Mammogram? Yes   Up to date of Bone Density/Dexa? Yes Colorectal: Up to date  Additional Screenings:  Hepatitis C Screening: Pt declines today.      Plan:  I have personally reviewed and addressed the Medicare Annual Wellness questionnaire and have noted the following in the patient's chart:  A. Medical and social history B. Use of alcohol, tobacco or illicit drugs  C. Current medications and supplements D. Functional ability and status E.  Nutritional status F.  Physical activity G. Advance directives H. List of other physicians I.  Hospitalizations, surgeries, and ER visits in previous 12 months J.  [place] such as hearing and vision if needed, cognitive and depression L. Referrals and appointments - none  In addition, I have reviewed and discussed with patient certain preventive protocols, quality metrics, and best practice recommendations. A written personalized care plan for preventive services as well as general preventive health recommendations were provided to patient.  See attached scanned questionnaire for additional information.   Signed,  Fabio Neighbors, LPN Nurse Health Advisor   Nurse Recommendations: Pt declined the Pneumovax 23 and Hep C lab screen today.

## 2017-12-20 DIAGNOSIS — E559 Vitamin D deficiency, unspecified: Secondary | ICD-10-CM | POA: Diagnosis not present

## 2017-12-20 DIAGNOSIS — M8589 Other specified disorders of bone density and structure, multiple sites: Secondary | ICD-10-CM | POA: Diagnosis not present

## 2017-12-20 DIAGNOSIS — E039 Hypothyroidism, unspecified: Secondary | ICD-10-CM | POA: Diagnosis not present

## 2017-12-20 DIAGNOSIS — E119 Type 2 diabetes mellitus without complications: Secondary | ICD-10-CM | POA: Diagnosis not present

## 2017-12-20 DIAGNOSIS — E7800 Pure hypercholesterolemia, unspecified: Secondary | ICD-10-CM | POA: Diagnosis not present

## 2017-12-20 DIAGNOSIS — Z8673 Personal history of transient ischemic attack (TIA), and cerebral infarction without residual deficits: Secondary | ICD-10-CM | POA: Diagnosis not present

## 2017-12-21 LAB — COMPREHENSIVE METABOLIC PANEL
A/G RATIO: 2 (ref 1.2–2.2)
ALBUMIN: 4.6 g/dL (ref 3.5–4.8)
ALK PHOS: 86 IU/L (ref 39–117)
ALT: 20 IU/L (ref 0–32)
AST: 19 IU/L (ref 0–40)
BILIRUBIN TOTAL: 0.3 mg/dL (ref 0.0–1.2)
BUN/Creatinine Ratio: 25 (ref 12–28)
BUN: 18 mg/dL (ref 8–27)
CO2: 24 mmol/L (ref 20–29)
Calcium: 9.6 mg/dL (ref 8.7–10.3)
Chloride: 99 mmol/L (ref 96–106)
Creatinine, Ser: 0.71 mg/dL (ref 0.57–1.00)
GFR calc Af Amer: 98 mL/min/{1.73_m2} (ref >59)
GFR calc non Af Amer: 85 mL/min/{1.73_m2} (ref >59)
GLOBULIN, TOTAL: 2.3 g/dL (ref 1.5–4.5)
GLUCOSE: 183 mg/dL — AB (ref 65–99)
Potassium: 4 mmol/L (ref 3.5–5.2)
Sodium: 138 mmol/L (ref 134–144)
Total Protein: 6.9 g/dL (ref 6.0–8.5)

## 2017-12-21 LAB — CBC WITH DIFFERENTIAL/PLATELET
BASOS ABS: 0 10*3/uL (ref 0.0–0.2)
BASOS: 0 %
EOS (ABSOLUTE): 0.1 10*3/uL (ref 0.0–0.4)
Eos: 3 %
HEMOGLOBIN: 14.5 g/dL (ref 11.1–15.9)
Hematocrit: 43.3 % (ref 34.0–46.6)
Immature Grans (Abs): 0 10*3/uL (ref 0.0–0.1)
Immature Granulocytes: 0 %
Lymphocytes Absolute: 1.6 10*3/uL (ref 0.7–3.1)
Lymphs: 30 %
MCH: 32.4 pg (ref 26.6–33.0)
MCHC: 33.5 g/dL (ref 31.5–35.7)
MCV: 97 fL (ref 79–97)
MONOCYTES: 9 %
Monocytes Absolute: 0.5 10*3/uL (ref 0.1–0.9)
Neutrophils Absolute: 3.2 10*3/uL (ref 1.4–7.0)
Neutrophils: 58 %
Platelets: 239 10*3/uL (ref 150–379)
RBC: 4.47 x10E6/uL (ref 3.77–5.28)
RDW: 13.1 % (ref 12.3–15.4)
WBC: 5.5 10*3/uL (ref 3.4–10.8)

## 2017-12-21 LAB — LIPID PANEL
CHOLESTEROL TOTAL: 358 mg/dL — AB (ref 100–199)
Chol/HDL Ratio: 7.6 ratio — ABNORMAL HIGH (ref 0.0–4.4)
HDL: 47 mg/dL (ref >39)
LDL CALC: 241 mg/dL — AB (ref 0–99)
Triglycerides: 349 mg/dL — ABNORMAL HIGH (ref 0–149)
VLDL Cholesterol Cal: 70 mg/dL — ABNORMAL HIGH (ref 5–40)

## 2017-12-21 LAB — HEPATITIS C ANTIBODY: Hep C Virus Ab: <0.1 s/co ratio (ref 0.0–0.9)

## 2017-12-21 LAB — VITAMIN D 25 HYDROXY (VIT D DEFICIENCY, FRACTURES): VIT D 25 HYDROXY: 36.6 ng/mL (ref 30.0–100.0)

## 2017-12-21 LAB — TSH: TSH: 3.99 u[IU]/mL (ref 0.450–4.500)

## 2017-12-21 LAB — HEMOGLOBIN A1C
ESTIMATED AVERAGE GLUCOSE: 177 mg/dL
Hgb A1c MFr Bld: 7.8 % — ABNORMAL HIGH (ref 4.8–5.6)

## 2017-12-23 DIAGNOSIS — G4733 Obstructive sleep apnea (adult) (pediatric): Secondary | ICD-10-CM | POA: Diagnosis not present

## 2017-12-29 ENCOUNTER — Ambulatory Visit
Admission: RE | Admit: 2017-12-29 | Discharge: 2017-12-29 | Disposition: A | Discharge status: Home / Self Care | Payer: Medicare HMO | Source: Ambulatory Visit | Attending: Physician Assistant | Admitting: Physician Assistant

## 2017-12-29 DIAGNOSIS — Z8673 Personal history of transient ischemic attack (TIA), and cerebral infarction without residual deficits: Secondary | ICD-10-CM | POA: Insufficient documentation

## 2017-12-29 DIAGNOSIS — I6523 Occlusion and stenosis of bilateral carotid arteries: Secondary | ICD-10-CM | POA: Insufficient documentation

## 2017-12-30 ENCOUNTER — Encounter: Payer: Self-pay | Admitting: Physician Assistant

## 2017-12-30 DIAGNOSIS — I6521 Occlusion and stenosis of right carotid artery: Secondary | ICD-10-CM

## 2017-12-30 DIAGNOSIS — Z8673 Personal history of transient ischemic attack (TIA), and cerebral infarction without residual deficits: Secondary | ICD-10-CM

## 2018-01-03 ENCOUNTER — Encounter (INDEPENDENT_AMBULATORY_CARE_PROVIDER_SITE_OTHER): Payer: Self-pay | Admitting: Vascular Surgery

## 2018-01-03 ENCOUNTER — Ambulatory Visit (INDEPENDENT_AMBULATORY_CARE_PROVIDER_SITE_OTHER): Payer: Medicare HMO | Admitting: Vascular Surgery

## 2018-01-03 VITALS — BP 116/67 | HR 63 | Resp 16 | Ht 65.5 in | Wt 172.6 lb

## 2018-01-03 DIAGNOSIS — E119 Type 2 diabetes mellitus without complications: Secondary | ICD-10-CM

## 2018-01-03 DIAGNOSIS — I6523 Occlusion and stenosis of bilateral carotid arteries: Secondary | ICD-10-CM | POA: Diagnosis not present

## 2018-01-03 DIAGNOSIS — E7800 Pure hypercholesterolemia, unspecified: Secondary | ICD-10-CM | POA: Diagnosis not present

## 2018-01-03 NOTE — Progress Notes (Signed)
Patient ID: Christine Zawadzki., female   DOB: 1945/11/19, 72 y.o.   MRN: [id number]  Chief Complaint  Patient presents with  . New Patient (Initial Visit)    ref Marlyn Corporal for carotid stenosis    HPI Christine Loncar. is a 72 y.o. female.  I am asked to see the patient by Fenton Malling for evaluation of carotid stenosis.  The patient reports a remote history of a TIA about 5 or 6 years ago.  She had her carotids checked at that time and she believes they had a "about 50% stenosis" then.  She has hyperlipidemia but is intolerant to all statins.  She denies any recent neurologic symptoms. Specifically, the patient denies amaurosis fugax, speech or swallowing difficulties, or arm or leg weakness or numbness.  A recently performed carotid duplex the hospital suggests 50 to 69% right ICA stenosis and less than 50% left ICA stenosis.   Past Medical History:  Diagnosis Date  . Arthritis    rheumatoid  . Diabetes mellitus without complication ([place])   . Diverticulosis   . Environmental and seasonal allergies   . GERD (gastroesophageal reflux disease)   . Hyperlipidemia   . Osteopenia   . Sinus congestion   . Sleep apnea    uses CPAP  . Stroke [facility])    TIA 6 years ago no issues since  . TIA (transient ischemic attack)     Past Surgical History:  Procedure Laterality Date  . BREAST BIOPSY Right 2010   benign  . BREAST CYST ASPIRATION Right   . BREAST SURGERY Right 2010   biopsy  . CATARACT EXTRACTION W/PHACO Right 11/11/2016   Procedure: CATARACT EXTRACTION PHACO AND INTRAOCULAR LENS PLACEMENT (IOC);  Surgeon: Eulogio Bear, MD;  Location: [facility] ORS;  Service: Ophthalmology;  Laterality: Right;  [place] 1:04.9 AP% 14.6 CDE 9.64 Fluid pack lot # [id number]  . ESOPHAGOGASTRODUODENOSCOPY (EGD) WITH PROPOFOL N/A 12/29/2015   Procedure: ESOPHAGOGASTRODUODENOSCOPY (EGD) WITH PROPOFOL;  Surgeon: Lucilla Lame, MD;  Location: [facility];  Service: Endoscopy;  Laterality: N/A;  GASTRI BIOPSY    . TUBAL LIGATION      Family History  Problem Relation Age of Onset  . Congestive Heart Failure Mother   . Heart attack Mother   . Dementia Father   . Heart attack Father   . Prostate cancer Father   . Atrial fibrillation Sister   . Hyperlipidemia Sister   . Heart disease Brother      Social History Social History   Tobacco Use  . Smoking status: Never Smoker  . Smokeless tobacco: Never Used  Substance Use Topics  . Alcohol use: No  . Drug use: No     Allergies  Allergen Reactions  . Antihistamines, Chlorpheniramine-Type     Facial swelling   . Macrobid [Nitrofurantoin Macrocrystal]     unknown  . Statins     Muscle Pain, Weakness     Current Outpatient Medications  Medication Sig Dispense Refill  . Cholecalciferol (VITAMIN D) 2000 UNITS CAPS Take 2,000 Units by mouth 2 (two) times daily.     Marland Kitchen glucose blood (ONETOUCH VERIO) test strip To check BS BID 200 each 12  . Lancets (ONETOUCH ULTRASOFT) lancets To check blood sugar BID 200 each 12  . Omega-3 Fatty Acids (FISH OIL) 1000 MG CAPS Take 3,000 mg by mouth at bedtime.      No current facility-administered medications for this visit.       REVIEW OF SYSTEMS (Negative  unless checked)  Constitutional: [] Weight loss  [] Fever  [] Chills Cardiac: [] Chest pain   [] Chest pressure   [] Palpitations   [] Shortness of breath when laying flat   [] Shortness of breath at rest   [] Shortness of breath with exertion. Vascular:  [] Pain in legs with walking   [] Pain in legs at rest   [] Pain in legs when laying flat   [] Claudication   [] Pain in feet when walking  [] Pain in feet at rest  [] Pain in feet when laying flat   [] History of DVT   [] Phlebitis   [] Swelling in legs   [] Varicose veins   [] Non-healing ulcers Pulmonary:   [] Uses home oxygen   [] Productive cough   [] Hemoptysis   [] Wheeze  [] COPD   [] Asthma Neurologic:  [] Dizziness  [] Blackouts   [] Seizures   [] History of stroke   [x] History of TIA  [] Aphasia   [] Temporary  blindness   [] Dysphagia   [] Weakness or numbness in arms   [] Weakness or numbness in legs Musculoskeletal:  [x] Arthritis   [] Joint swelling   [] Joint pain   [] Low back pain Hematologic:  [] Easy bruising  [] Easy bleeding   [] Hypercoagulable state   [] Anemic  [] Hepatitis Gastrointestinal:  [] Blood in stool   [] Vomiting blood  [x] Gastroesophageal reflux/heartburn   [] Abdominal pain Genitourinary:  [] Chronic kidney disease   [] Difficult urination  [] Frequent urination  [] Burning with urination   [] Hematuria Skin:  [] Rashes   [] Ulcers   [] Wounds Psychological:  [] History of anxiety   []  History of major depression.    Physical Exam BP 116/67 (BP Location: Right Arm)   Pulse 63   Resp 16   Ht 5' 5.5" (1.664 m)   Wt 172 lb 9.6 oz (78.3 kg)   BMI 28.29 kg/m  Gen:  WD/WN, NAD Head: [place]/AT, No temporalis wasting.  Ear/Nose/Throat: Hearing grossly intact, nares w/o erythema or drainage, oropharynx w/o Erythema/Exudate Eyes: Conjunctiva clear, sclera non-icteric  Neck: trachea midline.  No bruit or JVD.  Pulmonary:  Good air movement, clear to auscultation bilaterally.  Cardiac: RRR, normal S1, S2 Vascular:  Vessel Right Left  Radial Palpable Palpable                                   Gastrointestinal: soft, non-tender/non-distended Musculoskeletal: M/S 5/5 throughout.  Extremities without ischemic changes.  No deformity or atrophy. No edema. Neurologic: Sensation grossly intact in extremities.  Symmetrical.  Speech is fluent. Motor exam as listed above. Psychiatric: Judgment intact, Mood & affect appropriate for pt's clinical situation. Dermatologic: No rashes or ulcers noted.  No cellulitis or open wounds.  Radiology US Carotid Duplex Bilateral  Result Date: 12/29/2017 CLINICAL DATA:  History of carotid atherosclerosis and TIA. EXAM: BILATERAL CAROTID DUPLEX ULTRASOUND TECHNIQUE: Pearline Cables scale imaging, color Doppler and duplex ultrasound were performed of bilateral carotid and  vertebral arteries in the neck. COMPARISON:  04/19/2013 FINDINGS: Criteria: Quantification of carotid stenosis is based on velocity parameters that correlate the residual internal carotid diameter with NASCET-based stenosis levels, using the diameter of the distal internal carotid lumen as the denominator for stenosis measurement. The following velocity measurements were obtained: RIGHT ICA:  148/30 mid, 162/45 proximal cm/sec CCA:  952/84 cm/sec SYSTOLIC ICA/CCA RATIO:  1.4 ECA:  131 cm/sec LEFT ICA:  80/29 cm/sec CCA:  132/44 cm/sec SYSTOLIC ICA/CCA RATIO:  0.8 ECA:  107 cm/sec RIGHT CAROTID ARTERY: Moderate predominately calcified plaque is present at the level of the proximal right  internal carotid artery. Based on velocities, estimated right ICA stenosis is 50-69%. RIGHT VERTEBRAL ARTERY: Antegrade flow with normal waveform and velocity. LEFT CAROTID ARTERY: Mild amount of calcified plaque at the level of the carotid bulb and proximal left ICA. Velocities and waveforms are normal and estimated left ICA stenosis is less than 50%. LEFT VERTEBRAL ARTERY: Antegrade flow with normal waveform and velocity. IMPRESSION: 1. Moderate plaque of proximal right internal carotid artery with estimated right ICA stenosis of 50-69%. 2. Mild calcified plaque at the level of the left carotid bulb and ICA with estimated left ICA stenosis of less than 50%. Electronically Signed   By: Aletta Edouard M.D.   On: 12/29/2017 16:59    Labs Recent Results (from the past 2160 hour(s))  CBC w/Diff/Platelet     Status: None   Collection Time: 12/20/17  8:23 AM  Result Value Ref Range   WBC 5.5 3.4 - 10.8 x10E3/uL   RBC 4.47 3.77 - 5.28 x10E6/uL   Hemoglobin 14.5 11.1 - 15.9 g/dL   Hematocrit 43.3 34.0 - 46.6 %   MCV 97 79 - 97 fL   MCH 32.4 26.6 - 33.0 pg   MCHC 33.5 31.5 - 35.7 g/dL   RDW 13.1 12.3 - 15.4 %   Platelets 239 150 - 379 x10E3/uL   Neutrophils 58 Not Estab. %   Lymphs 30 Not Estab. %   Monocytes 9 Not Estab.  %   Eos 3 Not Estab. %   Basos 0 Not Estab. %   Neutrophils Absolute 3.2 1.4 - 7.0 x10E3/uL   Lymphocytes Absolute 1.6 0.7 - 3.1 x10E3/uL   Monocytes Absolute 0.5 0.1 - 0.9 x10E3/uL   EOS (ABSOLUTE) 0.1 0.0 - 0.4 x10E3/uL   Basophils Absolute 0.0 0.0 - 0.2 x10E3/uL   Immature Granulocytes 0 Not Estab. %   Immature Grans (Abs) 0.0 0.0 - 0.1 x10E3/uL  Comprehensive Metabolic Panel (CMET)     Status: Abnormal   Collection Time: 12/20/17  8:23 AM  Result Value Ref Range   Glucose 183 (H) 65 - 99 mg/dL   BUN 18 8 - 27 mg/dL   Creatinine, Ser 0.71 0.57 - 1.00 mg/dL   GFR calc non Af Amer 85 >59 mL/min/1.73   GFR calc Af Amer 98 >59 mL/min/1.73   BUN/Creatinine Ratio 25 12 - 28   Sodium 138 134 - 144 mmol/L   Potassium 4.0 3.5 - 5.2 mmol/L   Chloride 99 96 - 106 mmol/L   CO2 24 20 - 29 mmol/L   Calcium 9.6 8.7 - 10.3 mg/dL   Total Protein 6.9 6.0 - 8.5 g/dL   Albumin 4.6 3.5 - 4.8 g/dL   Globulin, Total 2.3 1.5 - 4.5 g/dL   Albumin/Globulin Ratio 2.0 1.2 - 2.2   Bilirubin Total 0.3 0.0 - 1.2 mg/dL   Alkaline Phosphatase 86 39 - 117 IU/L   AST 19 0 - 40 IU/L   ALT 20 0 - 32 IU/L  TSH     Status: None   Collection Time: 12/20/17  8:23 AM  Result Value Ref Range   TSH 3.990 0.450 - 4.500 uIU/mL  Lipid Profile     Status: Abnormal   Collection Time: 12/20/17  8:23 AM  Result Value Ref Range   Cholesterol, Total 358 (H) 100 - 199 mg/dL   Triglycerides 349 (H) 0 - 149 mg/dL   HDL 47 >39 mg/dL   VLDL Cholesterol Cal 70 (H) 5 - 40 mg/dL   LDL Calculated 241 (  H) 0 - 99 mg/dL   Comment: Comment     Comment: 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 FH should be suspected when fasting LDL cholesterol is above 189 mg/dL or non-HDL cholesterol is above 219 mg/dL. A family history of high cholesterol and heart disease in 1st degree relatives should be collected. J Clin Lipidol 2011;5:133-140    Chol/HDL Ratio 7.6 (H) 0.0 - 4.4 ratio    Comment:                                   T.  Chol/HDL Ratio                                             Men  Women                               1/2 Avg.Risk  3.4    3.3                                   Avg.Risk  5.0    4.4                                2X Avg.Risk  9.6    7.1                                3X Avg.Risk 23.4   11.0   HgB A1c     Status: Abnormal   Collection Time: 12/20/17  8:23 AM  Result Value Ref Range   Hgb A1c MFr Bld 7.8 (H) 4.8 - 5.6 %    Comment:          Prediabetes: 5.7 - 6.4          Diabetes: >6.4          Glycemic control for adults with diabetes: <7.0    Est. average glucose Bld gHb Est-mCnc 177 mg/dL  Vitamin D (25 hydroxy)     Status: None   Collection Time: 12/20/17  8:23 AM  Result Value Ref Range   Vit D, 25-Hydroxy 36.6 30.0 - 100.0 ng/mL    Comment: Vitamin D deficiency has been defined by the [place] and an Endocrine Society practice guideline as a level of serum 25-OH vitamin D less than 20 ng/mL (1,2). The Endocrine Society went on to further define vitamin D insufficiency as a level between 21 and 29 ng/mL (2). 1. IOM (Institute of Medicine). 2010. Dietary reference    intakes for calcium and D. [place]: The    [employer]. 2. Holick MF, Binkley Grandfield, Bischoff-Ferrari HA, et al.    Evaluation, treatment, and prevention of vitamin D    deficiency: an Endocrine Society clinical practice    guideline. JCEM. 2011 Jul; 96(7):1911-30.   Hepatitis C antibody     Status: None   Collection Time: 12/20/17  8:23 AM  Result Value Ref Range   Hep C Virus Ab <0.1 0.0 - 0.9 s/co ratio    Comment:  Negative:     < 0.8                              Indeterminate: 0.8 - 0.9                                   Positive:     > 0.9  The CDC recommends that a positive HCV antibody result  be followed up with a HCV Nucleic Acid Amplification  test (824235).     Assessment/Plan:  Controlled diabetes mellitus type II without  complication blood glucose control important in reducing the progression of atherosclerotic disease. Also, involved in wound healing. Well controlled at this time.   Hypercholesteremia Intolerant to statins. Does take fish oil.  Keeping her cholesterol under control is important to reduce the risk of progression of atherosclerosis.  Carotid artery narrowing A recently performed carotid duplex the hospital suggests 50 to 69% right ICA stenosis and less than 50% left ICA stenosis.  A long discussion today regarding the pathophysiology and natural history of carotid disease.  We discussed that this is important secondary to its role with risk factors for stroke.  We discussed that at her current level, her degree of stenosis is not high enough to warrant surgery.  A baby aspirin daily would be recommended.  She cannot tolerate statins.  I would recheck her carotid duplex in 6 months with follow-up planned in the office at that time.  She will contact our office with any problems in the interim.      Leotis Pain 01/03/2018, 11:53 AM   This note was created with Dragon medical transcription system.  Any errors from dictation are unintentional.

## 2018-01-03 NOTE — Assessment & Plan Note (Signed)
blood glucose control important in reducing the progression of atherosclerotic disease. Also, involved in wound healing. Well controlled at this time.

## 2018-01-03 NOTE — Patient Instructions (Signed)
Carotid Artery Disease The carotid arteries are arteries on both sides of the neck. They carry blood to the brain. Carotid artery disease is when the arteries get smaller (narrow) or get blocked. If these arteries get smaller or get blocked, you are more likely to have a stroke or warning stroke (transient ischemic attack). Follow these instructions at home:  Take medicines as told by your doctor. Make sure you understand all your medicine instructions. Do not stop your medicines without talking to your doctor first.  Follow your doctor's diet instructions. It is important to eat a healthy diet that includes plenty of: ? Fresh fruits. ? Vegetables. ? Lean meats.  Avoid: ? High-fat foods. ? High-sodium foods. ? Foods that are fried, overly processed, or have poor nutritional value.  Stay a healthy weight.  Stay active. Get at least 30 minutes of activity every day.  Do not smoke.  Limit alcohol use to: ? No more than 2 drinks a day for men. ? No more than 1 drink a day for women who are not pregnant.  Do not use illegal drugs.  Keep all doctor visits as told. Get help right away if:  You have sudden weakness or loss of feeling (numbness) on one side of the body, such as the face, arm, or leg.  You have sudden confusion.  You have trouble speaking (aphasia) or understanding.  You have sudden trouble seeing out of one or both eyes.  You have sudden trouble walking.  You have dizziness or feel like you might pass out (faint).  You have a loss of balance or your movements are not steady (uncoordinated).  You have a sudden, severe headache with no known cause.  You have trouble swallowing (dysphagia). Call your local emergency services (911 in [place] Do notdrive yourself to the clinic or hospital. This information is not intended to replace advice given to you by your health care provider. Make sure you discuss any questions you have with your health care  provider. Document Released: 08/02/2012 Document Revised: 01/22/2016 Document Reviewed: 02/14/2013 Elsevier Interactive Patient Education  2018 Elsevier Inc.  

## 2018-01-03 NOTE — Assessment & Plan Note (Signed)
A recently performed carotid duplex the hospital suggests 50 to 69% right ICA stenosis and less than 50% left ICA stenosis.  A long discussion today regarding the pathophysiology and natural history of carotid disease.  We discussed that this is important secondary to its role with risk factors for stroke.  We discussed that at her current level, her degree of stenosis is not high enough to warrant surgery.  A baby aspirin daily would be recommended.  She cannot tolerate statins.  I would recheck her carotid duplex in 6 months with follow-up planned in the office at that time.  She will contact our office with any problems in the interim.

## 2018-01-03 NOTE — Assessment & Plan Note (Signed)
Intolerant to statins. Does take fish oil.  Keeping her cholesterol under control is important to reduce the risk of progression of atherosclerosis.

## 2018-01-22 DIAGNOSIS — G4733 Obstructive sleep apnea (adult) (pediatric): Secondary | ICD-10-CM | POA: Diagnosis not present

## 2018-02-02 DIAGNOSIS — R69 Illness, unspecified: Secondary | ICD-10-CM | POA: Diagnosis not present

## 2018-02-06 DIAGNOSIS — R69 Illness, unspecified: Secondary | ICD-10-CM | POA: Diagnosis not present

## 2018-02-10 DIAGNOSIS — G4733 Obstructive sleep apnea (adult) (pediatric): Secondary | ICD-10-CM | POA: Diagnosis not present

## 2018-02-21 ENCOUNTER — Encounter: Payer: Self-pay | Admitting: Family Medicine

## 2018-02-21 ENCOUNTER — Ambulatory Visit (INDEPENDENT_AMBULATORY_CARE_PROVIDER_SITE_OTHER): Payer: Medicare HMO | Admitting: Family Medicine

## 2018-02-21 VITALS — BP 110/62 | HR 62 | Temp 98.2°F | Resp 15 | Wt 175.0 lb

## 2018-02-21 DIAGNOSIS — N3090 Cystitis, unspecified without hematuria: Secondary | ICD-10-CM | POA: Diagnosis not present

## 2018-02-21 DIAGNOSIS — R350 Frequency of micturition: Secondary | ICD-10-CM

## 2018-02-21 LAB — POCT URINALYSIS DIPSTICK
Bilirubin, UA: NEGATIVE
Glucose, UA: POSITIVE — AB
KETONES UA: NEGATIVE
Nitrite, UA: NEGATIVE
PH UA: 6 (ref 5.0–8.0)
Protein, UA: POSITIVE — AB
Spec Grav, UA: 1.02 (ref 1.010–1.025)
UROBILINOGEN UA: 0.2 U/dL

## 2018-02-21 MED ORDER — CEPHALEXIN 500 MG PO CAPS: 500.0000 mg | ORAL_CAPSULE | Freq: Two times a day (BID) | ORAL | 0 refills | Status: DC

## 2018-02-21 NOTE — Progress Notes (Signed)
  Subjective:     Patient ID: Christine Richards., female   DOB: 16-Nov-1945, 72 y.o.   MRN: [id number] Chief Complaint  Patient presents with  . Urinary Frequency    Patient comes in office today with complaints or urinary frequency and urgency for the past 3 days.    HPI Last UTI in 2015. No fever or chills reported.  Review of Systems     Objective:   Physical Exam  Constitutional: She appears well-developed and well-nourished. No distress.  Genitourinary:  Genitourinary Comments: No cva tenderness       Assessment:    1. Urinary frequency - POCT urinalysis dipstick - Urine Culture  2. Cystitis - cephALEXin (KEFLEX) 500 MG capsule; Take 1 capsule (500 mg total) by mouth 2 (two) times daily.  Dispense: 14 capsule; Refill: 0    Plan:    Further f/u pending urine culture results

## 2018-02-21 NOTE — Patient Instructions (Signed)
We will call you with the urine culture results. 

## 2018-02-22 DIAGNOSIS — G4733 Obstructive sleep apnea (adult) (pediatric): Secondary | ICD-10-CM | POA: Diagnosis not present

## 2018-02-23 ENCOUNTER — Telehealth: Payer: Self-pay

## 2018-02-23 ENCOUNTER — Other Ambulatory Visit: Payer: Self-pay | Admitting: Physician Assistant

## 2018-02-23 DIAGNOSIS — N63 Unspecified lump in unspecified breast: Secondary | ICD-10-CM

## 2018-02-23 LAB — URINE CULTURE

## 2018-02-23 NOTE — Telephone Encounter (Signed)
-----   Message from Carmon Ginsberg, [place] sent at 02/23/2018  1:55 PM EDT ----- Continue Keflex for a Staphylococcus UTI.

## 2018-02-23 NOTE — Telephone Encounter (Signed)
Patient advised.KW 

## 2018-03-03 ENCOUNTER — Telehealth: Payer: Self-pay | Admitting: Physician Assistant

## 2018-03-03 DIAGNOSIS — Z1239 Encounter for other screening for malignant neoplasm of breast: Secondary | ICD-10-CM

## 2018-03-03 NOTE — Telephone Encounter (Signed)
Per [place] [facility] pt will need an order for bilateral diagnostic TOMO.She is due for her yearly

## 2018-03-03 NOTE — Telephone Encounter (Signed)
Ordered

## 2018-03-09 ENCOUNTER — Other Ambulatory Visit: Payer: Self-pay | Admitting: Physician Assistant

## 2018-03-09 ENCOUNTER — Encounter: Payer: Self-pay | Admitting: Physician Assistant

## 2018-03-09 DIAGNOSIS — R928 Other abnormal and inconclusive findings on diagnostic imaging of breast: Secondary | ICD-10-CM

## 2018-03-09 DIAGNOSIS — F418 Other specified anxiety disorders: Secondary | ICD-10-CM

## 2018-03-09 MED ORDER — ALPRAZOLAM 0.5 MG PO TABS: 0.5000 mg | ORAL_TABLET | Freq: Every evening | ORAL | 0 refills | Status: DC | PRN

## 2018-03-09 NOTE — Progress Notes (Signed)
mammo ordered.

## 2018-03-21 ENCOUNTER — Other Ambulatory Visit: Payer: Self-pay

## 2018-03-24 DIAGNOSIS — G4733 Obstructive sleep apnea (adult) (pediatric): Secondary | ICD-10-CM | POA: Diagnosis not present

## 2018-04-06 ENCOUNTER — Ambulatory Visit
Admission: RE | Admit: 2018-04-06 | Discharge: 2018-04-06 | Disposition: A | Discharge status: Home / Self Care | Payer: Medicare HMO | Source: Ambulatory Visit | Attending: Physician Assistant | Admitting: Physician Assistant

## 2018-04-06 ENCOUNTER — Telehealth: Payer: Self-pay

## 2018-04-06 DIAGNOSIS — R928 Other abnormal and inconclusive findings on diagnostic imaging of breast: Secondary | ICD-10-CM

## 2018-04-06 DIAGNOSIS — N6313 Unspecified lump in the right breast, lower outer quadrant: Secondary | ICD-10-CM | POA: Diagnosis not present

## 2018-04-06 DIAGNOSIS — N63 Unspecified lump in unspecified breast: Secondary | ICD-10-CM | POA: Diagnosis not present

## 2018-04-06 DIAGNOSIS — N6314 Unspecified lump in the right breast, lower inner quadrant: Secondary | ICD-10-CM | POA: Diagnosis not present

## 2018-04-06 DIAGNOSIS — R922 Inconclusive mammogram: Secondary | ICD-10-CM | POA: Diagnosis not present

## 2018-04-06 NOTE — Telephone Encounter (Signed)
Patient advised as directed below.  Thanks,  -Joseline 

## 2018-04-06 NOTE — Telephone Encounter (Signed)
-----   Message from Mar Daring, [place] sent at 04/06/2018  4:08 PM EDT ----- Stable benign cyst in the right breast. Recommend to have one more final [place] in one year at your next annual bilat. diagnostic mammogram

## 2018-04-21 DIAGNOSIS — G4733 Obstructive sleep apnea (adult) (pediatric): Secondary | ICD-10-CM | POA: Diagnosis not present

## 2018-04-24 DIAGNOSIS — G4733 Obstructive sleep apnea (adult) (pediatric): Secondary | ICD-10-CM | POA: Diagnosis not present

## 2018-05-25 DIAGNOSIS — G4733 Obstructive sleep apnea (adult) (pediatric): Secondary | ICD-10-CM | POA: Diagnosis not present

## 2018-06-20 DIAGNOSIS — G4733 Obstructive sleep apnea (adult) (pediatric): Secondary | ICD-10-CM | POA: Diagnosis not present

## 2018-06-24 DIAGNOSIS — G4733 Obstructive sleep apnea (adult) (pediatric): Secondary | ICD-10-CM | POA: Diagnosis not present

## 2018-07-05 NOTE — Progress Notes (Signed)
Patient: Christine Richards. Female    DOB: December 12, 1945   72 y.o.   MRN: [id number] Visit Date: 07/07/2018  Today's Provider: Mar Daring, PA-C   Chief Complaint  Patient presents with  . Follow-up    T2DM   Subjective:    HPI  Diabetes Mellitus Type II, Follow-up:   Lab Results  Component Value Date   HGBA1C 7.9 (A) 07/07/2018   HGBA1C 7.8 (H) 12/20/2017   HGBA1C 8.0 09/15/2017    Last seen for diabetes 6 months ago.  Management since then includes none.Patient not on medicine. Current symptoms include none and have been stable. Home blood sugar records: 180's-130's fasting   Current Insulin Regimen:none. Most Recent Eye Exam: Scheduled for this month Weight trend: stable Prior visit with dietician: no Current diet: in general, a "healthy" diet   Current exercise: yes  Pertinent Labs:    Component Value Date/Time   CHOL 358 (H) 12/20/2017 0823   TRIG 349 (H) 12/20/2017 0823   HDL 47 12/20/2017 0823   LDLCALC 241 (H) 12/20/2017 0823   CREATININE 0.71 12/20/2017 0823   CREATININE 0.72 03/17/2013 1443    Wt Readings from Last 3 Encounters:  07/07/18 174 lb 3.2 oz (79 kg)  02/21/18 175 lb (79.4 kg)  01/03/18 172 lb 9.6 oz (78.3 kg)   ------------------------------------------------------------------------     Allergies  Allergen Reactions  . Antihistamines, Chlorpheniramine-Type     Facial swelling   . Macrobid [Nitrofurantoin Macrocrystal]     unknown  . Statins     Muscle Pain, Weakness      Current Outpatient Medications:  .  ALPRAZolam (XANAX) 0.5 MG tablet, Take 1 tablet (0.5 mg total) by mouth at bedtime as needed for anxiety., Disp: 10 tablet, Rfl: 0 .  cephALEXin (KEFLEX) 500 MG capsule, Take 1 capsule (500 mg total) by mouth 2 (two) times daily., Disp: 14 capsule, Rfl: 0 .  Cholecalciferol (VITAMIN D) 2000 UNITS CAPS, Take 2,000 Units by mouth 2 (two) times daily. , Disp: , Rfl:  .  glucose blood (ONETOUCH VERIO) test strip, To  check BS BID, Disp: 200 each, Rfl: 12 .  Lancets (ONETOUCH ULTRASOFT) lancets, To check blood sugar BID, Disp: 200 each, Rfl: 12 .  Omega-3 Fatty Acids (FISH OIL) 1000 MG CAPS, Take 3,000 mg by mouth at bedtime. , Disp: , Rfl:   Review of Systems  Constitutional: Negative.   Respiratory: Negative.   Cardiovascular: Negative.   Gastrointestinal: Negative.   Neurological: Negative.     Social History   Tobacco Use  . Smoking status: Never Smoker  . Smokeless tobacco: Never Used  Substance Use Topics  . Alcohol use: No   Objective:   BP (!) 154/78 (BP Location: Left Arm, Patient Position: Sitting, Cuff Size: Normal)   Pulse 62   Temp 98.3 F (36.8 C) (Oral)   Resp 16   Wt 174 lb 3.2 oz (79 kg)   BMI 28.55 kg/m  Vitals:   07/07/18 1013  BP: (!) 154/78  Pulse: 62  Resp: 16  Temp: 98.3 F (36.8 C)  TempSrc: Oral  Weight: 174 lb 3.2 oz (79 kg)     Physical Exam  Constitutional: She appears well-developed and well-nourished. No distress.  Neck: Normal range of motion. Neck supple.  Cardiovascular: Normal rate, regular rhythm and normal heart sounds. Exam reveals no gallop and no friction rub.  No murmur heard. Pulmonary/Chest: Effort normal and breath sounds normal. No respiratory distress.  She has no wheezes. She has no rales.  Skin: She is not diaphoretic.  Vitals reviewed.  Diabetic Foot Exam - Simple   Simple Foot Form Diabetic Foot exam was performed with the following findings:  Yes 07/07/2018  3:26 PM  Visual Inspection No deformities, no ulcerations, no other skin breakdown bilaterally:  Yes Sensation Testing Intact to touch and monofilament testing bilaterally:  Yes Pulse Check Posterior Tibialis and Dorsalis pulse intact bilaterally:  Yes Comments        Assessment & Plan:     1. Controlled type 2 diabetes mellitus without complication, without long-term current use of insulin (HCC) A1c stable at 7.9. Patient still declines medications. Discussed  continuing lifestyle modifications. I will see her back in 3 months for recheck.  - POCT glycosylated hemoglobin (Hb A1C) - Blood Glucose Monitoring Suppl (ONETOUCH VERIO FLEX SYSTEM) w/Device KIT; New kit with device to check blood sugar once daily  Dispense: 1 kit; Refill: 0       Mar Daring, PA-C  [facility]

## 2018-07-07 ENCOUNTER — Encounter: Payer: Self-pay | Admitting: Physician Assistant

## 2018-07-07 ENCOUNTER — Ambulatory Visit (INDEPENDENT_AMBULATORY_CARE_PROVIDER_SITE_OTHER): Payer: Medicare HMO | Admitting: Physician Assistant

## 2018-07-07 VITALS — BP 154/78 | HR 62 | Temp 98.3°F | Resp 16 | Wt 174.2 lb

## 2018-07-07 DIAGNOSIS — R69 Illness, unspecified: Secondary | ICD-10-CM | POA: Diagnosis not present

## 2018-07-07 DIAGNOSIS — E119 Type 2 diabetes mellitus without complications: Secondary | ICD-10-CM | POA: Diagnosis not present

## 2018-07-07 LAB — POCT GLYCOSYLATED HEMOGLOBIN (HGB A1C)
ESTIMATED AVERAGE GLUCOSE: 180
Hemoglobin A1C: 7.9 % — AB (ref 4.0–5.6)

## 2018-07-07 MED ORDER — ONETOUCH VERIO FLEX SYSTEM W/DEVICE KIT: PACK | 0 refills | Status: DC

## 2018-07-10 ENCOUNTER — Other Ambulatory Visit (INDEPENDENT_AMBULATORY_CARE_PROVIDER_SITE_OTHER): Payer: Self-pay | Admitting: Vascular Surgery

## 2018-07-10 DIAGNOSIS — I6529 Occlusion and stenosis of unspecified carotid artery: Secondary | ICD-10-CM

## 2018-07-11 ENCOUNTER — Ambulatory Visit (INDEPENDENT_AMBULATORY_CARE_PROVIDER_SITE_OTHER): Payer: Medicare HMO

## 2018-07-11 ENCOUNTER — Encounter (INDEPENDENT_AMBULATORY_CARE_PROVIDER_SITE_OTHER): Payer: Self-pay | Admitting: Vascular Surgery

## 2018-07-11 ENCOUNTER — Ambulatory Visit (INDEPENDENT_AMBULATORY_CARE_PROVIDER_SITE_OTHER): Payer: Medicare HMO | Admitting: Vascular Surgery

## 2018-07-11 VITALS — BP 133/80 | HR 65 | Resp 17 | Ht 65.5 in | Wt 174.0 lb

## 2018-07-11 DIAGNOSIS — E7800 Pure hypercholesterolemia, unspecified: Secondary | ICD-10-CM | POA: Diagnosis not present

## 2018-07-11 DIAGNOSIS — I6529 Occlusion and stenosis of unspecified carotid artery: Secondary | ICD-10-CM | POA: Diagnosis not present

## 2018-07-11 DIAGNOSIS — I6523 Occlusion and stenosis of bilateral carotid arteries: Secondary | ICD-10-CM

## 2018-07-11 DIAGNOSIS — E119 Type 2 diabetes mellitus without complications: Secondary | ICD-10-CM

## 2018-07-11 NOTE — Assessment & Plan Note (Signed)
Carotid duplex today shows velocities in the 60 to 79% range on the right and in the 1 to 39% range on the left with mild increase in velocities from her previous studies. The patient stopped taking aspirin therapy secondary to GI issues.  We discussed how aspirin is helpful in slowing progression of carotid disease.  At this time, we will continue to follow her on 32-month intervals with carotid duplex.  Below the threshold for consideration for prophylactic repair.

## 2018-07-11 NOTE — Patient Instructions (Signed)
Carotid Artery Disease The carotid arteries are arteries on both sides of the neck. They carry blood to the brain. Carotid artery disease is when the arteries get smaller (narrow) or get blocked. If these arteries get smaller or get blocked, you are more likely to have a stroke or warning stroke (transient ischemic attack). Follow these instructions at home:  Take medicines as told by your doctor. Make sure you understand all your medicine instructions. Do not stop your medicines without talking to your doctor first.  Follow your doctor's diet instructions. It is important to eat a healthy diet that includes plenty of: ? Fresh fruits. ? Vegetables. ? Lean meats.  Avoid: ? High-fat foods. ? High-sodium foods. ? Foods that are fried, overly processed, or have poor nutritional value.  Stay a healthy weight.  Stay active. Get at least 30 minutes of activity every day.  Do not smoke.  Limit alcohol use to: ? No more than 2 drinks a day for men. ? No more than 1 drink a day for women who are not pregnant.  Do not use illegal drugs.  Keep all doctor visits as told. Get help right away if:  You have sudden weakness or loss of feeling (numbness) on one side of the body, such as the face, arm, or leg.  You have sudden confusion.  You have trouble speaking (aphasia) or understanding.  You have sudden trouble seeing out of one or both eyes.  You have sudden trouble walking.  You have dizziness or feel like you might pass out (faint).  You have a loss of balance or your movements are not steady (uncoordinated).  You have a sudden, severe headache with no known cause.  You have trouble swallowing (dysphagia). Call your local emergency services (911 in [place] Do notdrive yourself to the clinic or hospital. This information is not intended to replace advice given to you by your health care provider. Make sure you discuss any questions you have with your health care  provider. Document Released: 08/02/2012 Document Revised: 01/22/2016 Document Reviewed: 02/14/2013 Elsevier Interactive Patient Education  2018 Elsevier Inc.  

## 2018-07-11 NOTE — Progress Notes (Signed)
MRN : [id number]  Christine Richards is a 72 y.o. (22-Nov-1945) female who presents with chief complaint of  Chief Complaint  Patient presents with  . Follow-up    6 month Carotid f/u  .  History of Present Illness: Patient returns in follow-up of her carotid disease.  She is doing well without specific complaints today.  She denies any focal neurologic symptoms. Specifically, the patient denies amaurosis fugax, speech or swallowing difficulties, or arm or leg weakness or numbness.  Carotid duplex today shows velocities in the 60 to 79% range on the right and in the 1 to 39% range on the left with mild increase in velocities from her previous studies.  Current Outpatient Medications  Medication Sig Dispense Refill  . Cholecalciferol (VITAMIN D) 2000 UNITS CAPS Take 2,000 Units by mouth 2 (two) times daily.     . Omega-3 Fatty Acids (FISH OIL) 1000 MG CAPS Take 3,000 mg by mouth at bedtime.     . ALPRAZolam (XANAX) 0.5 MG tablet Take 1 tablet (0.5 mg total) by mouth at bedtime as needed for anxiety. (Patient not taking: Reported on 07/11/2018) 10 tablet 0  . Blood Glucose Monitoring Suppl (ONETOUCH VERIO FLEX SYSTEM) w/Device KIT New kit with device to check blood sugar once daily (Patient not taking: Reported on 07/11/2018) 1 kit 0  . glucose blood (ONETOUCH VERIO) test strip To check BS BID (Patient not taking: Reported on 07/11/2018) 200 each 12  . Lancets (ONETOUCH ULTRASOFT) lancets To check blood sugar BID (Patient not taking: Reported on 07/11/2018) 200 each 12   No current facility-administered medications for this visit.     Past Medical History:  Diagnosis Date  . Arthritis    rheumatoid  . Diabetes mellitus without complication ([place])   . Diverticulosis   . Environmental and seasonal allergies   . GERD (gastroesophageal reflux disease)   . Hyperlipidemia   . Osteopenia   . Sinus congestion   . Sleep apnea    uses CPAP  . Stroke [facility])    TIA 6 years ago no issues since  .  TIA (transient ischemic attack)     Past Surgical History:  Procedure Laterality Date  . BREAST BIOPSY Right 2010   benign  . BREAST CYST ASPIRATION Right   . BREAST SURGERY Right 2010   biopsy  . CATARACT EXTRACTION W/PHACO Right 11/11/2016   Procedure: CATARACT EXTRACTION PHACO AND INTRAOCULAR LENS PLACEMENT (IOC);  Surgeon: Eulogio Bear, MD;  Location: [facility] ORS;  Service: Ophthalmology;  Laterality: Right;  [place] 1:04.9 AP% 14.6 CDE 9.64 Fluid pack lot # [id number]  . ESOPHAGOGASTRODUODENOSCOPY (EGD) WITH PROPOFOL N/A 12/29/2015   Procedure: ESOPHAGOGASTRODUODENOSCOPY (EGD) WITH PROPOFOL;  Surgeon: Lucilla Lame, MD;  Location: [facility];  Service: Endoscopy;  Laterality: N/A;  GASTRI BIOPSY  . TUBAL LIGATION     Family History  Problem Relation Age of Onset  . Congestive Heart Failure Mother   . Heart attack Mother   . Dementia Father   . Heart attack Father   . Prostate cancer Father   . Atrial fibrillation Sister   . Hyperlipidemia Sister   . Heart disease Brother      Social History Social History       Tobacco Use  . Smoking status: Never Smoker  . Smokeless tobacco: Never Used  Substance Use Topics  . Alcohol use: No  . Drug use: No          Allergies  Allergen Reactions  .  Antihistamines, Chlorpheniramine-Type     Facial swelling   . Macrobid [Nitrofurantoin Macrocrystal]     unknown  . Statins     Muscle Pain, Weakness               REVIEW OF SYSTEMS (Negative unless checked)  Constitutional: [] Weight loss  [] Fever  [] Chills Cardiac: [] Chest pain   [] Chest pressure   [] Palpitations   [] Shortness of breath when laying flat   [] Shortness of breath at rest   [] Shortness of breath with exertion. Vascular:  [] Pain in legs with walking   [] Pain in legs at rest   [] Pain in legs when laying flat   [] Claudication   [] Pain in feet when walking  [] Pain in feet at rest  [] Pain in feet when laying flat   [] History of  DVT   [] Phlebitis   [] Swelling in legs   [] Varicose veins   [] Non-healing ulcers Pulmonary:   [] Uses home oxygen   [] Productive cough   [] Hemoptysis   [] Wheeze  [] COPD   [] Asthma Neurologic:  [] Dizziness  [] Blackouts   [] Seizures   [] History of stroke   [x] History of TIA  [] Aphasia   [] Temporary blindness   [] Dysphagia   [] Weakness or numbness in arms   [] Weakness or numbness in legs Musculoskeletal:  [x] Arthritis   [] Joint swelling   [] Joint pain   [] Low back pain Hematologic:  [] Easy bruising  [] Easy bleeding   [] Hypercoagulable state   [] Anemic  [] Hepatitis Gastrointestinal:  [] Blood in stool   [] Vomiting blood  [x] Gastroesophageal reflux/heartburn   [] Abdominal pain Genitourinary:  [] Chronic kidney disease   [] Difficult urination  [] Frequent urination  [] Burning with urination   [] Hematuria Skin:  [] Rashes   [] Ulcers   [] Wounds Psychological:  [] History of anxiety   []  History of major depression.    Physical Examination  Vitals:   07/11/18 1025 07/11/18 1026  BP: 138/73 133/80  Pulse: 63 65  Resp: 17   Weight: 174 lb (78.9 kg)   Height: 5' 5.5" (1.664 m)    Body mass index is 28.51 kg/m. Gen:  WD/WN, NAD Head: [place]/AT, No temporalis wasting. Ear/Nose/Throat: Hearing grossly intact, nares w/o erythema or drainage, trachea midline Eyes: Conjunctiva clear. Sclera non-icteric Neck: Supple.  Right caortid bruit  Pulmonary:  Good air movement, equal and clear to auscultation bilaterally.  Cardiac: RRR, No JVD Vascular:  Vessel Right Left  Radial Palpable Palpable                                    Musculoskeletal: M/S 5/5 throughout.  No deformity or atrophy. No edema. Neurologic: CN 2-12 intact. Sensation grossly intact in extremities.  Symmetrical.  Speech is fluent. Motor exam as listed above. Psychiatric: Judgment intact, Mood & affect appropriate for pt's clinical situation. Dermatologic: No rashes or ulcers noted.  No cellulitis or open wounds.      CBC Lab  Results  Component Value Date   WBC 5.5 12/20/2017   HGB 14.5 12/20/2017   HCT 43.3 12/20/2017   MCV 97 12/20/2017   PLT 239 12/20/2017    BMET    Component Value Date/Time   NA 138 12/20/2017 0823   NA 138 03/17/2013 1443   K 4.0 12/20/2017 0823   K 3.6 03/17/2013 1443   CL 99 12/20/2017 0823   CL 105 03/17/2013 1443   CO2 24 12/20/2017 0823   CO2 30 03/17/2013 1443   GLUCOSE 183 (H)  12/20/2017 0823   GLUCOSE 192 (H) 03/17/2013 1443   BUN 18 12/20/2017 0823   BUN 16 03/17/2013 1443   CREATININE 0.71 12/20/2017 0823   CREATININE 0.72 03/17/2013 1443   CALCIUM 9.6 12/20/2017 0823   CALCIUM 9.1 03/17/2013 1443   GFRNONAA 85 12/20/2017 0823   GFRNONAA >60 03/17/2013 1443   GFRAA 98 12/20/2017 0823   GFRAA >60 03/17/2013 1443   CrCl cannot be calculated (Patient's most recent lab result is older than the maximum 21 days allowed.).  COAG No results found for: INR, PROTIME  Radiology No results found.   Assessment/Plan Controlled diabetes mellitus type II without complication blood glucose control important in reducing the progression of atherosclerotic disease. Also, involved in wound healing. Well controlled at this time.   Hypercholesteremia Intolerant to statins. Does take fish oil.  Keeping her cholesterol under control is important to reduce the risk of progression of atherosclerosis.  Carotid stenosis Carotid duplex today shows velocities in the 60 to 79% range on the right and in the 1 to 39% range on the left with mild increase in velocities from her previous studies. The patient stopped taking aspirin therapy secondary to GI issues.  We discussed how aspirin is helpful in slowing progression of carotid disease.  At this time, we will continue to follow her on 27-monthintervals with carotid duplex.  Below the threshold for consideration for prophylactic repair.    JLeotis Pain MD  07/11/2018 11:41 AM    This note was created with Dragon medical  transcription system.  Any errors from dictation are purely unintentional

## 2018-07-17 DIAGNOSIS — H6123 Impacted cerumen, bilateral: Secondary | ICD-10-CM | POA: Diagnosis not present

## 2018-07-17 DIAGNOSIS — H903 Sensorineural hearing loss, bilateral: Secondary | ICD-10-CM | POA: Diagnosis not present

## 2018-07-18 ENCOUNTER — Telehealth: Payer: Self-pay | Admitting: Physician Assistant

## 2018-07-18 NOTE — Telephone Encounter (Signed)
Pt needing to know a good orthopedic doctor Tawanna Sat could recommend.  She feels she has a stress fracture in her foot.  Thanks, [employer]

## 2018-07-19 DIAGNOSIS — M79672 Pain in left foot: Secondary | ICD-10-CM | POA: Diagnosis not present

## 2018-07-19 DIAGNOSIS — M7752 Other enthesopathy of left foot: Secondary | ICD-10-CM | POA: Diagnosis not present

## 2018-07-19 NOTE — Telephone Encounter (Signed)
Please Review

## 2018-07-19 NOTE — Telephone Encounter (Signed)
Patient prefers someone in town

## 2018-07-19 NOTE — Telephone Encounter (Signed)
Pt advised on who she could see for her foot.  dbs

## 2018-07-19 NOTE — Telephone Encounter (Signed)
[facility]

## 2018-07-19 NOTE — Telephone Encounter (Signed)
For foot issues I recommend Dr. Barkley Bruns in [place], [phone]

## 2018-07-20 DIAGNOSIS — H2512 Age-related nuclear cataract, left eye: Secondary | ICD-10-CM | POA: Diagnosis not present

## 2018-07-20 LAB — HM DIABETES EYE EXAM

## 2018-07-21 ENCOUNTER — Encounter: Payer: Self-pay | Admitting: Physician Assistant

## 2018-07-21 DIAGNOSIS — G4733 Obstructive sleep apnea (adult) (pediatric): Secondary | ICD-10-CM | POA: Diagnosis not present

## 2018-07-23 DIAGNOSIS — R69 Illness, unspecified: Secondary | ICD-10-CM | POA: Diagnosis not present

## 2018-08-29 ENCOUNTER — Telehealth: Payer: Self-pay

## 2018-08-29 DIAGNOSIS — B309 Viral conjunctivitis, unspecified: Secondary | ICD-10-CM | POA: Diagnosis not present

## 2018-08-29 NOTE — Telephone Encounter (Signed)
Patient reports that she had flu last week and is now having eye redness and discharge from both eyes. Patient did want to be seen but we are out of appointments. Patient wants to know if something can be called to [facility] on [address] and [address]. Please advise. CB# [phone]

## 2018-08-29 NOTE — Telephone Encounter (Signed)
Patient advised.

## 2018-08-29 NOTE — Telephone Encounter (Signed)
Given that she recently had flu and this is bilateral, it is likely viral conjunctivitis.  This is treated conservatively with warm compresses and moisturizing drops if needed.  If this worsens, vision is impaired, has significant light sensitivity, or has pain with eye movements, should be seen.  Would treat conservatively and then see [place] Thursday if still having problems.  We have some open appts then

## 2018-09-11 DIAGNOSIS — L219 Seborrheic dermatitis, unspecified: Secondary | ICD-10-CM | POA: Diagnosis not present

## 2018-10-18 DIAGNOSIS — G4733 Obstructive sleep apnea (adult) (pediatric): Secondary | ICD-10-CM | POA: Diagnosis not present

## 2018-10-21 DIAGNOSIS — R69 Illness, unspecified: Secondary | ICD-10-CM | POA: Diagnosis not present

## 2018-11-03 DIAGNOSIS — R69 Illness, unspecified: Secondary | ICD-10-CM | POA: Diagnosis not present

## 2018-11-04 ENCOUNTER — Ambulatory Visit (INDEPENDENT_AMBULATORY_CARE_PROVIDER_SITE_OTHER): Payer: Medicare HMO | Admitting: Family Medicine

## 2018-11-04 ENCOUNTER — Emergency Department
Admission: EM | Admit: 2018-11-04 | Discharge: 2018-11-04 | Disposition: A | Discharge status: Home / Self Care | Payer: Medicare HMO | Attending: Emergency Medicine | Admitting: Emergency Medicine

## 2018-11-04 ENCOUNTER — Other Ambulatory Visit: Payer: Self-pay

## 2018-11-04 ENCOUNTER — Encounter: Payer: Self-pay | Admitting: Family Medicine

## 2018-11-04 VITALS — BP 125/73 | HR 68 | Wt 166.4 lb

## 2018-11-04 DIAGNOSIS — E039 Hypothyroidism, unspecified: Secondary | ICD-10-CM | POA: Insufficient documentation

## 2018-11-04 DIAGNOSIS — R339 Retention of urine, unspecified: Secondary | ICD-10-CM

## 2018-11-04 DIAGNOSIS — E119 Type 2 diabetes mellitus without complications: Secondary | ICD-10-CM | POA: Diagnosis not present

## 2018-11-04 DIAGNOSIS — N3001 Acute cystitis with hematuria: Secondary | ICD-10-CM | POA: Diagnosis not present

## 2018-11-04 DIAGNOSIS — Z8673 Personal history of transient ischemic attack (TIA), and cerebral infarction without residual deficits: Secondary | ICD-10-CM | POA: Insufficient documentation

## 2018-11-04 DIAGNOSIS — R300 Dysuria: Secondary | ICD-10-CM | POA: Diagnosis not present

## 2018-11-04 DIAGNOSIS — Z79899 Other long term (current) drug therapy: Secondary | ICD-10-CM | POA: Diagnosis not present

## 2018-11-04 LAB — URINALYSIS, COMPLETE (UACMP) WITH MICROSCOPIC
Bilirubin Urine: NEGATIVE
Glucose, UA: NEGATIVE mg/dL
HGB URINE DIPSTICK: NEGATIVE
Ketones, ur: NEGATIVE mg/dL
NITRITE: NEGATIVE
PROTEIN: NEGATIVE mg/dL
Specific Gravity, Urine: 1.005 (ref 1.005–1.030)
pH: 7 (ref 5.0–8.0)

## 2018-11-04 LAB — BASIC METABOLIC PANEL
Anion gap: 9 (ref 5–15)
BUN: 13 mg/dL (ref 8–23)
CHLORIDE: 104 mmol/L (ref 98–111)
CO2: 25 mmol/L (ref 22–32)
Calcium: 9.4 mg/dL (ref 8.9–10.3)
Creatinine, Ser: 0.6 mg/dL (ref 0.44–1.00)
GFR calc Af Amer: >60 mL/min (ref >60)
GFR calc non Af Amer: >60 mL/min (ref >60)
Glucose, Bld: 189 mg/dL — ABNORMAL HIGH (ref 70–99)
POTASSIUM: 3.8 mmol/L (ref 3.5–5.1)
Sodium: 138 mmol/L (ref 135–145)

## 2018-11-04 LAB — CBC WITH DIFFERENTIAL/PLATELET
ABS IMMATURE GRANULOCYTES: 0.02 10*3/uL (ref 0.00–0.07)
Basophils Absolute: 0 10*3/uL (ref 0.0–0.1)
Basophils Relative: 1 %
Eosinophils Absolute: 0.1 10*3/uL (ref 0.0–0.5)
Eosinophils Relative: 2 %
HCT: 44.7 % (ref 36.0–46.0)
HEMOGLOBIN: 14.3 g/dL (ref 12.0–15.0)
Immature Granulocytes: 0 %
LYMPHS PCT: 23 %
Lymphs Abs: 1.1 10*3/uL (ref 0.7–4.0)
MCH: 30.9 pg (ref 26.0–34.0)
MCHC: 32 g/dL (ref 30.0–36.0)
MCV: 96.5 fL (ref 80.0–100.0)
MONO ABS: 0.3 10*3/uL (ref 0.1–1.0)
MONOS PCT: 7 %
NEUTROS ABS: 3.3 10*3/uL (ref 1.7–7.7)
NRBC: 0 % (ref 0.0–0.2)
Neutrophils Relative %: 67 %
Platelets: 219 10*3/uL (ref 150–400)
RBC: 4.63 MIL/uL (ref 3.87–5.11)
RDW: 12.8 % (ref 11.5–15.5)
WBC: 4.9 10*3/uL (ref 4.0–10.5)

## 2018-11-04 MED ORDER — CEPHALEXIN 500 MG PO CAPS
500.0000 mg | ORAL_CAPSULE | Freq: Once | ORAL | Status: AC
  Administered 2018-11-04: 500 mg via ORAL
  Filled 2018-11-04: qty 1

## 2018-11-04 MED ORDER — CEPHALEXIN 500 MG PO CAPS: 500.0000 mg | ORAL_CAPSULE | Freq: Once | ORAL | Status: DC

## 2018-11-04 MED ORDER — CEPHALEXIN 500 MG PO CAPS: 500.0000 mg | ORAL_CAPSULE | Freq: Three times a day (TID) | ORAL | 0 refills | Status: AC

## 2018-11-04 NOTE — ED Triage Notes (Signed)
States yesterday began with only able to dribble few drops with urination. States only happens during day. States during night she urinates "a lot". Doesn't appear uncomfortable at this time. States was sent from doctor today. States was only able to get a few dribbles at MD office today. A&O, ambulatory.

## 2018-11-04 NOTE — Discharge Instructions (Addendum)
As explained to you, you have a urinary tract infection.  We recommended a Foley catheter to help your body get rid of the infection.  By declining a Foley catheter the infection can get worse and can progress to your kidneys and eventually developed into sepsis which can make you extremely sick and even caused you to die.  Therefore it is very important to take the antibiotics as prescribed and follow-up with your doctor tomorrow morning for reevaluation.  Also it is very important that you return to the emergency room if you have abdominal pain, if you are unable to urinate, if you develop fever, flank pain, nausea or vomiting.

## 2018-11-04 NOTE — ED Provider Notes (Signed)
[facility] Emergency Department Provider Note  ____________________________________________  Time seen: Approximately 1:33 PM  I have reviewed the triage vital signs and the nursing notes.   HISTORY  Chief Complaint Urinary Retention   HPI Christine Richards is a 73 y.o. female with a history of TIA, hyperlipidemia, diabetes, and rheumatoid arthritis who presents for evaluation of urinary retention.  Patient reports dysuria that started yesterday.  Throughout the night she was able to urinate several times however this morning patient was able to dribble some urine but unable to fully empty her bladder.  She reports prior episodes of urinary retention in the setting of UTI.  She denies abdominal pain or flank pain, fever chills, nausea or vomiting.   Past Medical History:  Diagnosis Date  . Arthritis    rheumatoid  . Diabetes mellitus without complication ([place])   . Diverticulosis   . Environmental and seasonal allergies   . GERD (gastroesophageal reflux disease)   . Hyperlipidemia   . Osteopenia   . Sinus congestion   . Sleep apnea    uses CPAP  . Stroke [facility])    TIA 6 years ago no issues since  . TIA (transient ischemic attack)     Patient Active Problem List   Diagnosis Date Noted  . Heartburn   . Gastritis   . Situational anxiety 11/20/2015  . Arthritis 02/11/2015  . Allergic rhinitis 12/25/2014  . Aneurysm ([place]) 12/25/2014  . Carotid stenosis 12/25/2014  . Controlled diabetes mellitus type II without complication ([place]) 70/35/0093  . Aerophobia 12/25/2014  . Fibrositis 12/25/2014  . Headache, migraine 12/25/2014  . Osteopenia 12/25/2014  . Avitaminosis D 12/25/2014  . 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 Personal history of TIA (transient ischemic attack) 09/01/2009  . 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 Acquired hypothyroidism 04/25/2009  . Hypercholesteremia 02/20/2007  . 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 Apnea, sleep 12/02/2006    Past Surgical History:  Procedure Laterality Date  . BREAST BIOPSY Right 2010   benign  . BREAST CYST ASPIRATION Right   . BREAST SURGERY Right 2010   biopsy  . CATARACT EXTRACTION W/PHACO Right 11/11/2016   Procedure: CATARACT EXTRACTION PHACO AND INTRAOCULAR LENS PLACEMENT (IOC);  Surgeon: Eulogio Bear, MD;  Location: [facility] ORS;  Service: Ophthalmology;  Laterality: Right;  [place] 1:04.9 AP% 14.6 CDE 9.64 Fluid pack lot # [id number]  . ESOPHAGOGASTRODUODENOSCOPY (EGD) WITH PROPOFOL N/A 12/29/2015   Procedure: ESOPHAGOGASTRODUODENOSCOPY (EGD) WITH PROPOFOL;  Surgeon: Lucilla Lame, MD;  Location: [place];  Service: Endoscopy;  Laterality: N/A;  GASTRI BIOPSY  . TUBAL LIGATION      Prior to Admission medications   Medication Sig Start Date End Date Taking? Authorizing Provider  ALPRAZolam Duanne Moron) 0.5 MG tablet Take 1 tablet (0.5 mg total) by mouth at bedtime as needed for anxiety. Patient not taking: Reported on 07/11/2018 03/09/18   Mar Daring, PA-C  Blood Glucose Monitoring Suppl (ONETOUCH VERIO FLEX SYSTEM) w/Device KIT New kit with device to check blood sugar once daily Patient not taking: Reported on 07/11/2018 07/07/18   Mar Daring, PA-C  cephALEXin (KEFLEX) 500 MG capsule Take 1 capsule (500 mg total) by mouth 3 (three) times daily for 7 days. 11/04/18 11/11/18  Rudene Re, MD  Cholecalciferol (VITAMIN D) 2000 UNITS CAPS Take 2,000 Units by mouth 2 (two) times daily.  07/05/14   [provider]  glucose blood (ONETOUCH VERIO) test strip To check BS  BID Patient not taking: Reported on 07/11/2018 12/15/17   Mar Daring, PA-C  Lancets [facility] ULTRASOFT) lancets To check blood sugar BID Patient not taking: Reported on 07/11/2018 12/15/17   Mar Daring, PA-C  Omega-3 Fatty Acids (FISH OIL) 1000 MG CAPS Take 3,000 mg by mouth at bedtime.  12/02/06   [provider]     Allergies Antihistamines, chlorpheniramine-type; Macrobid [nitrofurantoin macrocrystal]; and Statins  Family History  Problem Relation Age of Onset  . Congestive Heart Failure Mother   . Heart attack Mother   . Dementia Father   . Heart attack Father   . Prostate cancer Father   . Atrial fibrillation Sister   . Hyperlipidemia Sister   . Heart disease Brother     Social History Social History   Tobacco Use  . Smoking status: Never Smoker  . Smokeless tobacco: Never Used  Substance Use Topics  . Alcohol use: No  . Drug use: No    Review of Systems  Constitutional: Negative for fever. Eyes: Negative for visual changes. ENT: Negative for sore throat. Neck: No neck pain  Cardiovascular: Negative for chest pain. Respiratory: Negative for shortness of breath. Gastrointestinal: Negative for abdominal pain, vomiting or diarrhea. Genitourinary: + dysuria and urinary retention Musculoskeletal: Negative for back pain. Skin: Negative for rash. Neurological: Negative for headaches, weakness or numbness. Psych: No SI or HI  ____________________________________________   PHYSICAL EXAM:  VITAL SIGNS: ED Triage Vitals  Enc Vitals Group     BP 11/04/18 1043 (!) 135/118     Pulse Rate 11/04/18 1043 65     Resp 11/04/18 1043 16     Temp 11/04/18 1043 98.1 F (36.7 C)     Temp Source 11/04/18 1043 Oral     SpO2 11/04/18 1043 98 %     Weight 11/04/18 1044 163 lb (73.9 kg)     Height 11/04/18 1044 _0  (1.676 m)     Head Circumference --      Peak Flow --      Pain Score 11/04/18 1043 10     Pain Loc --      Pain Edu? --      Excl. in [place]? --     Constitutional: Alert and oriented. Well appearing and in no apparent distress. HEENT:      Head: Normocephalic and atraumatic.         Eyes: Conjunctivae are normal. Sclera is non-icteric.       Mouth/Throat: Mucous membranes are moist.       Neck: Supple with no signs of meningismus. Cardiovascular: Regular rate and  rhythm. No murmurs, gallops, or rubs. 2+ symmetrical distal pulses are present in all extremities. No JVD. Respiratory: Normal respiratory effort. Lungs are clear to auscultation bilaterally. No wheezes, crackles, or rhonchi.  Gastrointestinal: Soft, non tender, and non distended with positive bowel sounds. No rebound or guarding. Genitourinary: No CVA tenderness. Musculoskeletal: Nontender with normal range of motion in all extremities. No edema, cyanosis, or erythema of extremities. Neurologic: Normal speech and language. Face is symmetric. Moving all extremities. No gross focal neurologic deficits are appreciated. Skin: Skin is warm, dry and intact. No rash noted. Psychiatric: Mood and affect are normal. Speech and behavior are normal.  ____________________________________________   LABS (all labs ordered are listed, but only abnormal results are displayed)  Labs Reviewed  URINALYSIS, COMPLETE (UACMP) WITH MICROSCOPIC - Abnormal; Notable for the following components:      Result Value   Color, Urine STRAW (*)  APPearance CLEAR (*)    Leukocytes,Ua MODERATE (*)    WBC, UA >50 (*)    Bacteria, UA RARE (*)    All other components within normal limits  BASIC METABOLIC PANEL - Abnormal; Notable for the following components:   Glucose, Bld 189 (*)    All other components within normal limits  URINE CULTURE  CBC WITH DIFFERENTIAL/PLATELET   ____________________________________________  EKG  none  ____________________________________________  RADIOLOGY  none  ____________________________________________   PROCEDURES  Procedure(s) performed: None Procedures Critical Care performed:  None ____________________________________________   INITIAL IMPRESSION / ASSESSMENT AND PLAN / ED COURSE  73 y.o. female with a history of TIA, hyperlipidemia, diabetes, and rheumatoid arthritis who presents for evaluation of urinary retention and dysuria since last night.  Patient is  well-appearing in no distress with normal vital signs, abdomen is soft with no tenderness, no flank tenderness.  Initial PVR done by me in the room showing 350 cc.  Patient was then able to urinate and PVR showed 150 cc.  UA is positive for UTI.  No signs of sepsis.  Patient was started on Keflex.  Recommend a Foley catheter to help clear the infection however patient declined at this time.  Recommended close follow-up with her doctor tomorrow morning for reevaluation.  Discussed return precautions for worsening retention, nausea, vomiting, fever, chills, abdominal pain or flank pain.  Discussed with the patient the risk of developing pyelonephritis and urosepsis in the setting of UTI and urinary retention.  Patient understands the risks but continues to decline Foley catheter placement.      As part of my medical decision making, I reviewed the following data within the [place] notes reviewed and incorporated, Labs reviewed , Old chart reviewed, Notes from prior ED visits and [place] Controlled Substance Database    Pertinent labs & imaging results that were available during my care of the patient were reviewed by me and considered in my medical decision making (see chart for details).    ____________________________________________   FINAL CLINICAL IMPRESSION(S) / ED DIAGNOSES  Final diagnoses:  Urinary retention  Acute cystitis with hematuria      NEW MEDICATIONS STARTED DURING THIS VISIT:  ED Discharge Orders         Ordered    cephALEXin (KEFLEX) 500 MG capsule  3 times daily     11/04/18 1338           Note:  This document was prepared using Dragon voice recognition software and may include unintentional dictation errors.    Alfred Levins, [place], MD 11/04/18 1340

## 2018-11-04 NOTE — Progress Notes (Signed)
Patient: Christine Richards. Female    DOB: 1946/05/01   73 y.o.   MRN: [id number] Visit Date: 11/04/2018  Today's Provider: Vernie Murders, PA   Chief Complaint  Patient presents with  . Urinary Tract Infection   Subjective:     Urinary Tract Infection   This is a new problem. Episode onset: several days. The problem occurs every urination. The problem has been unchanged. The patient is experiencing no pain. There has been no fever. Associated symptoms include hesitancy. She has tried increased fluids for the symptoms. The treatment provided no relief.    Allergies  Allergen Reactions  . Antihistamines, Chlorpheniramine-Type     Facial swelling   . Macrobid [Nitrofurantoin Macrocrystal]     unknown  . Statins     Muscle Pain, Weakness      Current Outpatient Medications:  .  Cholecalciferol (VITAMIN D) 2000 UNITS CAPS, Take 2,000 Units by mouth 2 (two) times daily. , Disp: , Rfl:  .  Omega-3 Fatty Acids (FISH OIL) 1000 MG CAPS, Take 3,000 mg by mouth at bedtime. , Disp: , Rfl:  .  ALPRAZolam (XANAX) 0.5 MG tablet, Take 1 tablet (0.5 mg total) by mouth at bedtime as needed for anxiety. (Patient not taking: Reported on 07/11/2018), Disp: 10 tablet, Rfl: 0 .  Blood Glucose Monitoring Suppl (ONETOUCH VERIO FLEX SYSTEM) w/Device KIT, New kit with device to check blood sugar once daily (Patient not taking: Reported on 07/11/2018), Disp: 1 kit, Rfl: 0 .  glucose blood (ONETOUCH VERIO) test strip, To check BS BID (Patient not taking: Reported on 07/11/2018), Disp: 200 each, Rfl: 12 .  Lancets (ONETOUCH ULTRASOFT) lancets, To check blood sugar BID (Patient not taking: Reported on 07/11/2018), Disp: 200 each, Rfl: 12  Review of Systems  Constitutional: Negative.   Respiratory: Negative.   Cardiovascular: Negative.   Genitourinary: Positive for decreased urine volume and hesitancy.  Musculoskeletal: Negative.     Social History   Tobacco Use  . Smoking status: Never Smoker  .  Smokeless tobacco: Never Used  Substance Use Topics  . Alcohol use: No     Objective:   BP 125/73 (BP Location: Left Arm, Patient Position: Sitting, Cuff Size: Normal)   Pulse 68   Wt 166 lb 6.4 oz (75.5 kg)   SpO2 98%   BMI 27.27 kg/m  Vitals:   11/04/18 0951  BP: 125/73  Pulse: 68  SpO2: 98%  Weight: 166 lb 6.4 oz (75.5 kg)      Assessment & Plan    1. Urinary retention Having poor urinary output the past several days. Presented for evaluation for UTI but unable to urinate despite drinking 4 large cups of water and waiting in the office for over an hour. Becoming uncomfortable and feeling she is very full. Sent to the ER for catheterization and evaluation for UTI with urinary retention. Patient agreed with plan.     Vernie Murders, PA  [facility]

## 2018-11-06 DIAGNOSIS — H35351 Cystoid macular degeneration, right eye: Secondary | ICD-10-CM | POA: Diagnosis not present

## 2018-11-06 LAB — URINE CULTURE: Culture: >=100000 — AB

## 2018-11-07 NOTE — Progress Notes (Signed)
Patient: Christine Richards. Female    DOB: 1945-09-11   73 y.o.   MRN: [id number] Visit Date: 11/08/2018  Today's Provider: Mar Daring, PA-C   Chief Complaint  Patient presents with  . Follow-up    ER   Subjective:    I, Sulibeya S. Dimas, CMA, am acting as a Education administrator for [employer], PA-C.  HPI  Follow up ER visit  Patient was seen in ER for urinary retention on 11/04/2018. She was treated for  Urinary retention  Acute cystitis with hematuria   Treatment for this included Keflex 500 mg capsules TID. She reports excellent compliance with treatment. She reports this condition is Improved. ------------------------------------------------------------------------------------   Allergies  Allergen Reactions  . Antihistamines, Chlorpheniramine-Type     Facial swelling   . Macrobid [Nitrofurantoin Macrocrystal]     unknown  . Statins     Muscle Pain, Weakness      Current Outpatient Medications:  .  Blood Glucose Monitoring Suppl (ONETOUCH VERIO FLEX SYSTEM) w/Device KIT, New kit with device to check blood sugar once daily, Disp: 1 kit, Rfl: 0 .  cephALEXin (KEFLEX) 500 MG capsule, Take 1 capsule (500 mg total) by mouth 3 (three) times daily for 7 days., Disp: 21 capsule, Rfl: 0 .  Cholecalciferol (VITAMIN D) 2000 UNITS CAPS, Take 2,000 Units by mouth 2 (two) times daily. , Disp: , Rfl:  .  glucose blood (ONETOUCH VERIO) test strip, To check BS BID, Disp: 200 each, Rfl: 12 .  Lancets (ONETOUCH ULTRASOFT) lancets, To check blood sugar BID, Disp: 200 each, Rfl: 12 .  ALPRAZolam (XANAX) 0.5 MG tablet, Take 1 tablet (0.5 mg total) by mouth at bedtime as needed for anxiety. (Patient not taking: Reported on 11/08/2018), Disp: 10 tablet, Rfl: 0 .  Omega-3 Fatty Acids (FISH OIL) 1000 MG CAPS, Take 3,000 mg by mouth at bedtime. , Disp: , Rfl:   Review of Systems  Constitutional: Negative.   Respiratory: Negative.   Genitourinary: Positive for difficulty urinating.      Social History   Tobacco Use  . Smoking status: Never Smoker  . Smokeless tobacco: Never Used  Substance Use Topics  . Alcohol use: No      Objective:   BP 116/72 (BP Location: Left Arm, Patient Position: Sitting, Cuff Size: Normal)   Pulse 60   Temp 98 F (36.7 C) (Oral)   Resp 16   Ht 5' 5.5" (1.664 m)   Wt 164 lb (74.4 kg)   BMI 26.88 kg/m  Vitals:   11/08/18 0755  BP: 116/72  Pulse: 60  Resp: 16  Temp: 98 F (36.7 C)  TempSrc: Oral  Weight: 164 lb (74.4 kg)  Height: 5' 5.5" (1.664 m)     Physical Exam      Assessment & Plan    Patient left without being seen today because she had to be at work and there was an emergency in the office that pushed [place] behind    Mar Daring, PA-C  [place]

## 2018-11-07 NOTE — Progress Notes (Signed)
Called patient on 3/10 @ ~1600 about positive urine culture and if the patient still has symptoms. If symptoms still present recommend ordering TMP/SMX DS x 3 days, if no symptoms do not order anything. Left voice message to call back [phone]. Case discussed with Dr. Merlyn Lot MD.

## 2018-11-08 ENCOUNTER — Ambulatory Visit (INDEPENDENT_AMBULATORY_CARE_PROVIDER_SITE_OTHER): Payer: Medicare HMO | Admitting: Physician Assistant

## 2018-11-08 ENCOUNTER — Encounter: Payer: Self-pay | Admitting: Physician Assistant

## 2018-11-08 VITALS — BP 116/72 | HR 60 | Temp 98.0°F | Resp 16 | Ht 65.5 in | Wt 164.0 lb

## 2018-11-08 DIAGNOSIS — N3000 Acute cystitis without hematuria: Secondary | ICD-10-CM

## 2018-11-08 NOTE — Progress Notes (Signed)
Patient returned pharmacist's phone call 3/11 afternoon. Patient reports she is still having similar symptoms as she was having prior to visit to ED.   I have called in a prescription for Bactrim DS, 1 tab BID x 3 days to [facility] [address] per [place].  Patient informed to stop taking Keflex and to start taking Bactrim when available from pharmacy.  Tawnya Crook, PharmD Pharmacy Resident  11/08/2018 2:47 PM

## 2018-11-08 NOTE — Progress Notes (Signed)
Called patient on 3/10 ~1600 and 3/11~ 1400 about positive urine culture and if the patient still has symptoms. If symptoms still present recommend ordering TMP/SMX DS x 3 days, if no symptoms do not order anything. Left voice message to call back [phone]. Case discussed with Dr. Merlyn Lot MD.

## 2018-12-18 ENCOUNTER — Encounter: Payer: Self-pay | Admitting: Physician Assistant

## 2018-12-18 ENCOUNTER — Other Ambulatory Visit: Payer: Self-pay

## 2018-12-18 ENCOUNTER — Ambulatory Visit (INDEPENDENT_AMBULATORY_CARE_PROVIDER_SITE_OTHER): Payer: Medicare HMO

## 2018-12-18 DIAGNOSIS — Z0000 Encounter for general adult medical examination without abnormal findings: Secondary | ICD-10-CM | POA: Diagnosis not present

## 2018-12-18 NOTE — Progress Notes (Signed)
Subjective:   Christine Richards is a 73 y.o. female who presents for Medicare Annual (Subsequent) preventive examination.  This visit is being conducted via telephone due to the COVID-19 pandemic. This patient has given me verbal consent via telephone to conduct this visit. Some vital signs may be absent or patient reported.  Patient identification: identified by name, DOB, and current address.     Review of Systems:  N/A  Cardiac Risk Factors include: advanced age (>57mn, >>11women);diabetes mellitus     Objective:     Vitals: There were no vitals taken for this visit.  There is no height or weight on file to calculate BMI. Unable to obtain due to virtual visit restrictions.   Advanced Directives 12/18/2018 11/04/2018 12/15/2017 03/31/2017 12/09/2016 12/29/2015 11/20/2015  Does Patient Have a Medical Advance Directive? Yes No No No No No No  Type of Advance Directive HSheridanin Chart? No - copy requested - - - - - -  Would patient like information on creating a medical advance directive? - No - Patient declined No - Patient declined No - Patient declined No - Patient declined No - patient declined information -    Tobacco Social History   Tobacco Use  Smoking Status Never Smoker  Smokeless Tobacco Never Used     Counseling given: Not Answered   Clinical Intake:  Pre-visit preparation completed: Yes  Pain Score: 0-No pain     Nutritional Status: BMI 25 -29 Overweight Nutritional Risks: None Diabetes: Yes  How often do you need to have someone help you when you read instructions, pamphlets, or other written materials from your doctor or pharmacy?: 1 - Never   Diabetes:  Is the patient diabetic?  Yes type 2 If diabetic, was a CBG obtained today?  No  Did the patient bring in their glucometer from home?  No  How often do you monitor your CBG's? Once every two days and as needed.   Financial Strains and  Diabetes Management:  Are you having any financial strains with the device, your supplies or your medication? No .  Does the patient want to be seen by Chronic Care Management for management of their diabetes?  No  Would the patient like to be referred to a Nutritionist or for Diabetic Management?  No   Diabetic Exams:  Diabetic Eye Exam: Completed 07/21/18.   Diabetic Foot Exam: Completed 07/07/18.    Interpreter Needed?: No  Information [facility] :: [facility] L[facility]  Past Medical History:  Diagnosis Date  . Arthritis    rheumatoid  . [place] complication [place]   . Diverticulosis   . Environmental and seasonal allergies   . GERD (gastroesophageal reflux disease)   . Hyperlipidemia   . Osteopenia   . Sinus congestion   . Sleep apnea    uses CPAP  . Stroke ([facility]    TIA 6 years ago no issues since  . TIA (transient ischemic attack)    Past Surgical History:  Procedure Laterality Date  . BREAST BIOPSY Right 2010   benign  . BREAST CYST ASPIRATION Right   . BREAST SURGERY Right 2010   biopsy  . CATARACT EXTRACTION W/PHACO Right 11/11/2016   Procedure: CATARACT EXTRACTION PHACO AND INTRAOCULAR LENS PLACEMENT (IOC);  Surgeon: BEulogio Bear MD;  Location: [facility] ORS;  Service: Ophthalmology;  Laterality: Right;  [place]1:04.9 AP% 14.6 CDE 9.64 Fluid  pack lot # [id number]  . ESOPHAGOGASTRODUODENOSCOPY (EGD) WITH PROPOFOL N/A 12/29/2015   Procedure: ESOPHAGOGASTRODUODENOSCOPY (EGD) WITH PROPOFOL;  Surgeon: Lucilla Lame, MD;  Location: [place];  Service: Endoscopy;  Laterality: N/A;  GASTRI BIOPSY  . TUBAL LIGATION     Family History  Problem Relation Age of Onset  . Congestive Heart Failure Mother   . Heart attack Mother   . Dementia Father   . Heart attack Father   . Prostate cancer Father   . Atrial fibrillation Sister   . Hyperlipidemia Sister   . Heart disease Brother    Social History   Socioeconomic History  . Marital status: Widowed     Spouse name: Not on file  . Number of children: 2  . Years of education: College  . Highest education level: Bachelor's degree (e.g., BA, AB, BS)  Occupational History  . Occupation: [employer]: Full Time  Social Needs  . Financial resource strain: Not hard at all  . Food insecurity:    Worry: Never true    Inability: Never true  . Transportation needs:    Medical: No    Non-medical: No  Tobacco Use  . Smoking status: Never Smoker  . Smokeless tobacco: Never Used  Substance and Sexual Activity  . Alcohol use: No  . Drug use: No  . Sexual activity: Not on file  Lifestyle  . Physical activity:    Days per week: 5 days    Minutes per session: 30 min  . Stress: Not at all  Relationships  . Social connections:    Talks on phone: Patient refused    Gets together: Patient refused    Attends religious service: Patient refused    Active member of club or organization: Patient refused    Attends meetings of clubs or organizations: Patient refused    Relationship status: Patient refused  Other Topics Concern  . Not on file  Social History Narrative  . Not on file    Outpatient Encounter Medications as of 12/18/2018  Medication Sig  . ALPRAZolam (XANAX) 0.5 MG tablet Take 1 tablet (0.5 mg total) by mouth at bedtime as needed for anxiety. (Patient taking differently: Take 0.5 mg by mouth at bedtime as needed for anxiety. Only takes when flying.)  . Blood Glucose Monitoring Suppl ([place]) w/Device KIT New kit with device to check blood sugar once daily  . Cholecalciferol (VITAMIN D) 2000 UNITS CAPS Take 2,000 Units by mouth 2 (two) times daily.   Marland Kitchen glucose blood (ONETOUCH VERIO) test strip To check BS BID  . Lancets (ONETOUCH ULTRASOFT) lancets To check blood sugar BID  . Omega-3 Fatty Acids (FISH OIL) 1000 MG CAPS Take 2,000 mg by mouth at bedtime.    No facility-administered encounter medications on file as of 12/18/2018.      Activities of Daily Living In your present state of health, do you have any difficulty performing the following activities: 12/18/2018  Hearing? Y  Comment Has hearing loss in both ears. Does not wear hearing aids at this time.   Vision? N  Difficulty concentrating or making decisions? N  Walking or climbing stairs? N  Dressing or bathing? N  Doing errands, shopping? N  Preparing Food and eating ? N  Using the Toilet? N  In the past six months, have you accidently leaked urine? N  Do you have problems with loss of bowel control? N  Managing your Medications? N  Managing your Finances? N  Housekeeping or managing your Housekeeping? N  Some recent data might be hidden    Patient Care Team: Mar Daring, PA-C as PCP - General (Family Medicine) Arelia Sneddon, [place] as Consulting Physician (Optometry) Leandrew Koyanagi, MD as Referring Physician (Ophthalmology)    Assessment:   This is a routine wellness examination for Graclyn.  Exercise Activities and Dietary recommendations Current Exercise Habits: Home exercise routine, Type of exercise: strength training/weights;walking, Time (Minutes): 60, Frequency (Times/Week): 5, Weekly Exercise (Minutes/Week): 300, Intensity: Mild  Goals    . DIET - INCREASE WATER INTAKE     Recommend increasing water intake to 4-6 glasses a day.     . Increase water intake     Recommend increasing water intake to 3-4 glasses a day.       Fall Risk: Fall Risk  12/18/2018 12/15/2017 03/31/2017 12/09/2016 08/06/2015  Falls in the past year? 0 No No No No    FALL RISK PREVENTION PERTAINING TO THE HOME:  Any stairs in or around the home? Yes  If so, are there any without handrails? Yes   Home free of loose throw rugs in walkways, pet beds, electrical cords, etc? Yes  Adequate lighting in your home to reduce risk of falls? Yes   ASSISTIVE DEVICES UTILIZED TO PREVENT FALLS:  Life alert? No  Use of a cane, walker or w/c? No  Grab bars in  the bathroom? No  Shower chair or bench in shower? No  Elevated toilet seat or a handicapped toilet? Yes   TIMED UP AND GO:  Was the test performed? No .    Depression Screen PHQ 2/9 Scores 12/18/2018 07/07/2018 12/15/2017 03/31/2017  PHQ - 2 Score 0 0 0 0  PHQ- 9 Score - 0 - -     Cognitive Function: Declined today.        6CIT Screen 12/09/2016  What Year? 0 points  What month? 0 points  What time? 0 points  Count back from 20 0 points  Months in reverse 0 points  Repeat phrase 0 points  Total Score 0    Immunization History  Administered Date(s) Administered  . Pneumococcal Conjugate-13 08/06/2015  . Td 12/22/2007  . Tdap 12/22/2007    Qualifies for Shingles Vaccine? Yes . Due for Shingrix. Education has been provided regarding the importance of this vaccine. Pt has been advised to call insurance company to determine out of pocket expense. Advised may also receive vaccine at local pharmacy or Health Dept. Verbalized acceptance and understanding.  Tdap: Although this vaccine is not a covered service during a Wellness Exam, does the patient still wish to receive this vaccine today?  No .  Advised pt he may receive this vaccine at local pharmacy or Health Dept. Aware to provide a copy of the vaccination record if obtained from local pharmacy or Health Dept. Verbalized acceptance and understanding.  Flu Vaccine: Due for Flu vaccine. Does the patient want to receive this vaccine today?  No . Advised may receive this vaccine at local pharmacy or Health Dept. Aware to provide a copy of the vaccination record if obtained from local pharmacy or Health Dept. Verbalized acceptance and understanding.  Pneumococcal Vaccine: Due for Pneumococcal vaccine. Does the patient want to receive this vaccine today?  No . Unable to administer due to visit completed virtually. Note made to f/u on this at next in office visit with PCP.   Screening Tests Health Maintenance  Topic Date Due  .  PNA  vac Low Risk Adult (2 of 2 - PPSV23) 08/05/2016  . URINE MICROALBUMIN  12/09/2017  . TETANUS/TDAP  07/08/2019 (Originally 12/21/2017)  . INFLUENZA VACCINE  11/28/2019 (Originally 03/31/2019)  . HEMOGLOBIN A1C  01/05/2019  . MAMMOGRAM  04/07/2019  . DEXA SCAN  06/27/2019  . FOOT EXAM  07/08/2019  . OPHTHALMOLOGY EXAM  07/21/2019  . COLONOSCOPY  12/14/2021  . Hepatitis C Screening  Completed    Cancer Screenings:  Colorectal Screening: Completed 12/15/11. Repeat every 10 years.  Mammogram: Completed 04/06/18.   Bone Density: Completed 06/26/14. Results reflect OSTEOPENIA. Repeat every 5 years.   Lung Cancer Screening: (Low Dose CT Chest recommended if Age 53-80 years, 30 pack-year currently smoking OR have quit w/in 15years.) does not qualify.   Additional Screening:  Hepatitis C Screening: Up to date  Dental Screening: Recommended annual dental exams for proper oral hygiene   Community Resource Referral:  CRR required this visit?  No       Plan:  I have personally reviewed and addressed the Medicare Annual Wellness questionnaire and have noted the following in the patient's chart:  A. Medical and social history B. Use of alcohol, tobacco or illicit drugs  C. Current medications and supplements D. Functional ability and status E.  Nutritional status F.  Physical activity G. Advance directives H. List of other physicians I.  Hospitalizations, surgeries, and ER visits in previous 12 months J.  [place] such as hearing and vision if needed, cognitive and depression L. Referrals and appointments   In addition, I have reviewed and discussed with patient certain preventive protocols, quality metrics, and best practice recommendations. A written personalized care plan for preventive services as well as general preventive health recommendations were provided to patient.   Glendora Score, [place]  10/23/8248 Nurse Health Advisor   Nurse Notes: Pt  declined receiving the tetanus vaccine. Pt needs her Pneumovax 23 vaccine and urine check at next in office visit.

## 2018-12-18 NOTE — Patient Instructions (Addendum)
Ms. Christine Richards , Thank you for taking time to come for your Medicare Wellness Visit. I appreciate your ongoing commitment to your health goals. Please review the following plan we discussed and let me know if I can assist you in the future.   Screening recommendations/referrals: Colonoscopy: Up to date, due 11/2021 Mammogram: Up to date, due 03/2020 Bone Density: Up to date, due 05/2019 Recommended yearly ophthalmology/optometry visit for glaucoma screening and checkup Recommended yearly dental visit for hygiene and checkup  Vaccinations: Influenza vaccine: Pt declines today.  Pneumococcal vaccine: Currently due for Pneumovax 23. Unable to administer due to virtual visit.  Tdap vaccine: Pt declines today.  Shingles vaccine: Pt declines today.     Advanced directives: Please bring a copy of your POA (Power of Attorney) and/or Living Will to your next appointment.   Conditions/risks identified: Continue increasing water intake to 6-8 8 oz glasses a day.   Next appointment: 02/12/19 @ 2:00 PM with Fenton Malling.   Preventive Care 73 Years and Older, Female Preventive care refers to lifestyle choices and visits with your health care provider that can promote health and wellness. What does preventive care include?  A yearly physical exam. This is also called an annual well check.  Dental exams once or twice a year.  Routine eye exams. Ask your health care provider how often you should have your eyes checked.  Personal lifestyle choices, including:  Daily care of your teeth and gums.  Regular physical activity.  Eating a healthy diet.  Avoiding tobacco and drug use.  Limiting alcohol use.  Practicing safe sex.  Taking low-dose aspirin every day.  Taking vitamin and mineral supplements as recommended by your health care provider. What happens during an annual well check? The services and screenings done by your health care provider during your annual well check will depend  on your age, overall health, lifestyle risk factors, and family history of disease. Counseling  Your health care provider may ask you questions about your:  Alcohol use.  Tobacco use.  Drug use.  Emotional well-being.  Home and relationship well-being.  Sexual activity.  Eating habits.  History of falls.  Memory and ability to understand (cognition).  Work and work Statistician.  Reproductive health. Screening  You may have the following tests or measurements:  Height, weight, and BMI.  Blood pressure.  Lipid and cholesterol levels. These may be checked every 5 years, or more frequently if you are over 74 years old.  Skin check.  Lung cancer screening. You may have this screening every year starting at age 46 if you have a 30-pack-year history of smoking and currently smoke or have quit within the past 15 years.  Fecal occult blood test (FOBT) of the stool. You may have this test every year starting at age 36.  Flexible sigmoidoscopy or colonoscopy. You may have a sigmoidoscopy every 5 years or a colonoscopy every 10 years starting at age 60.  Hepatitis C blood test.  Hepatitis B blood test.  Sexually transmitted disease (STD) testing.  Diabetes screening. This is done by checking your blood sugar (glucose) after you have not eaten for a while (fasting). You may have this done every 1-3 years.  Bone density scan. This is done to screen for osteoporosis. You may have this done starting at age 85.  Mammogram. This may be done every 1-2 years. Talk to your health care provider about how often you should have regular mammograms. Talk with your health care provider about your  test results, treatment options, and if necessary, the need for more tests. Vaccines  Your health care provider may recommend certain vaccines, such as:  Influenza vaccine. This is recommended every year.  Tetanus, diphtheria, and acellular pertussis (Tdap, Td) vaccine. You may need a Td  booster every 10 years.  Zoster vaccine. You may need this after age 59.  Pneumococcal 13-valent conjugate (PCV13) vaccine. One dose is recommended after age 35.  Pneumococcal polysaccharide (PPSV23) vaccine. One dose is recommended after age 59. Talk to your health care provider about which screenings and vaccines you need and how often you need them. This information is not intended to replace advice given to you by your health care provider. Make sure you discuss any questions you have with your health care provider. Document Released: 09/12/2015 Document Revised: 05/05/2016 Document Reviewed: 06/17/2015 Elsevier Interactive Patient Education  2017 [employer] Prevention in the Home Falls can cause injuries. They can happen to people of all ages. There are many things you can do to make your home safe and to help prevent falls. What can I do on the outside of my home?  Regularly fix the edges of walkways and driveways and fix any cracks.  Remove anything that might make you trip as you walk through a door, such as a raised step or threshold.  Trim any bushes or trees on the path to your home.  Use bright outdoor lighting.  Clear any walking paths of anything that might make someone trip, such as rocks or tools.  Regularly check to see if handrails are loose or broken. Make sure that both sides of any steps have handrails.  Any raised decks and porches should have guardrails on the edges.  Have any leaves, snow, or ice cleared regularly.  Use sand or salt on walking paths during winter.  Clean up any spills in your garage right away. This includes oil or grease spills. What can I do in the bathroom?  Use night lights.  Install grab bars by the toilet and in the tub and shower. Do not use towel bars as grab bars.  Use non-skid mats or decals in the tub or shower.  If you need to sit down in the shower, use a plastic, non-slip stool.  Keep the floor dry. Clean up  any water that spills on the floor as soon as it happens.  Remove soap buildup in the tub or shower regularly.  Attach bath mats securely with double-sided non-slip rug tape.  Do not have throw rugs and other things on the floor that can make you trip. What can I do in the bedroom?  Use night lights.  Make sure that you have a light by your bed that is easy to reach.  Do not use any sheets or blankets that are too big for your bed. They should not hang down onto the floor.  Have a firm chair that has side arms. You can use this for support while you get dressed.  Do not have throw rugs and other things on the floor that can make you trip. What can I do in the kitchen?  Clean up any spills right away.  Avoid walking on wet floors.  Keep items that you use a lot in easy-to-reach places.  If you need to reach something above you, use a strong step stool that has a grab bar.  Keep electrical cords out of the way.  Do not use floor polish or wax that  makes floors slippery. If you must use wax, use non-skid floor wax.  Do not have throw rugs and other things on the floor that can make you trip. What can I do with my stairs?  Do not leave any items on the stairs.  Make sure that there are handrails on both sides of the stairs and use them. Fix handrails that are broken or loose. Make sure that handrails are as long as the stairways.  Check any carpeting to make sure that it is firmly attached to the stairs. Fix any carpet that is loose or worn.  Avoid having throw rugs at the top or bottom of the stairs. If you do have throw rugs, attach them to the floor with carpet tape.  Make sure that you have a light switch at the top of the stairs and the bottom of the stairs. If you do not have them, ask someone to add them for you. What else can I do to help prevent falls?  Wear shoes that:  Do not have high heels.  Have rubber bottoms.  Are comfortable and fit you well.  Are  closed at the toe. Do not wear sandals.  If you use a stepladder:  Make sure that it is fully opened. Do not climb a closed stepladder.  Make sure that both sides of the stepladder are locked into place.  Ask someone to hold it for you, if possible.  Clearly mark and make sure that you can see:  Any grab bars or handrails.  First and last steps.  Where the edge of each step is.  Use tools that help you move around (mobility aids) if they are needed. These include:  Canes.  Walkers.  Scooters.  Crutches.  Turn on the lights when you go into a dark area. Replace any light bulbs as soon as they burn out.  Set up your furniture so you have a clear path. Avoid moving your furniture around.  If any of your floors are uneven, fix them.  If there are any pets around you, be aware of where they are.  Review your medicines with your doctor. Some medicines can make you feel dizzy. This can increase your chance of falling. Ask your doctor what other things that you can do to help prevent falls. This information is not intended to replace advice given to you by your health care provider. Make sure you discuss any questions you have with your health care provider. Document Released: 06/12/2009 Document Revised: 01/22/2016 Document Reviewed: 09/20/2014 Elsevier Interactive Patient Education  2017 [employer].

## 2018-12-19 ENCOUNTER — Ambulatory Visit: Payer: Self-pay

## 2019-01-09 ENCOUNTER — Ambulatory Visit (INDEPENDENT_AMBULATORY_CARE_PROVIDER_SITE_OTHER): Payer: Medicare HMO | Admitting: Vascular Surgery

## 2019-01-09 ENCOUNTER — Encounter (INDEPENDENT_AMBULATORY_CARE_PROVIDER_SITE_OTHER): Payer: Self-pay | Admitting: Vascular Surgery

## 2019-01-09 ENCOUNTER — Other Ambulatory Visit: Payer: Self-pay

## 2019-01-09 ENCOUNTER — Ambulatory Visit (INDEPENDENT_AMBULATORY_CARE_PROVIDER_SITE_OTHER): Payer: Medicare HMO

## 2019-01-09 VITALS — BP 134/71 | HR 65 | Resp 16 | Wt 163.8 lb

## 2019-01-09 DIAGNOSIS — E119 Type 2 diabetes mellitus without complications: Secondary | ICD-10-CM

## 2019-01-09 DIAGNOSIS — E7800 Pure hypercholesterolemia, unspecified: Secondary | ICD-10-CM | POA: Diagnosis not present

## 2019-01-09 DIAGNOSIS — I6523 Occlusion and stenosis of bilateral carotid arteries: Secondary | ICD-10-CM

## 2019-01-09 NOTE — Progress Notes (Signed)
MRN : [id number]  Christine Richards is a 73 y.o. (08/18/1946) female who presents with chief complaint of  Chief Complaint  Patient presents with  . Follow-up    ultrasound follow up  .  History of Present Illness: Patient returns in follow-up of her carotid disease.  She has had no major issues or problems since her last visit.  She denies any focal neurologic symptoms. Specifically, the patient denies amaurosis fugax, speech or swallowing difficulties, or arm or leg weakness or numbness Carotid duplex today shows velocities in the upper end of the 40 to 59% range on the right and on the lower end of the 40 to 59% range on the left.  This is slightly improved from her velocities on the right 6 months ago which were on the low end of the 60 to 79% range.  Current Outpatient Medications  Medication Sig Dispense Refill  . ALPRAZolam (XANAX) 0.5 MG tablet Take 1 tablet (0.5 mg total) by mouth at bedtime as needed for anxiety. (Patient taking differently: Take 0.5 mg by mouth at bedtime as needed for anxiety. Only takes when flying.) 10 tablet 0  . Blood Glucose Monitoring Suppl ([place]) w/Device KIT New kit with device to check blood sugar once daily 1 kit 0  . Cholecalciferol (VITAMIN D) 2000 UNITS CAPS Take 2,000 Units by mouth 2 (two) times daily.     Marland Kitchen glucose blood (ONETOUCH VERIO) test strip To check BS BID 200 each 12  . Lancets (ONETOUCH ULTRASOFT) lancets To check blood sugar BID 200 each 12  . Omega-3 Fatty Acids (FISH OIL) 1000 MG CAPS Take 2,000 mg by mouth at bedtime.      No current facility-administered medications for this visit.     Past Medical History:  Diagnosis Date  . Arthritis    rheumatoid  . Diabetes mellitus without complication ([place])   . Diverticulosis   . Environmental and seasonal allergies   . GERD (gastroesophageal reflux disease)   . Hyperlipidemia   . Osteopenia   . Sinus congestion   . Sleep apnea    uses CPAP  . Stroke [facility])    TIA 6 years ago no issues since  . TIA (transient ischemic attack)     Past Surgical History:  Procedure Laterality Date  . BREAST BIOPSY Right 2010   benign  . BREAST CYST ASPIRATION Right   . BREAST SURGERY Right 2010   biopsy  . CATARACT EXTRACTION W/PHACO Right 11/11/2016   Procedure: CATARACT EXTRACTION PHACO AND INTRAOCULAR LENS PLACEMENT (IOC);  Surgeon: Eulogio Bear, MD;  Location: [facility] ORS;  Service: Ophthalmology;  Laterality: Right;  [place] 1:04.9 AP% 14.6 CDE 9.64 Fluid pack lot # [id number]  . ESOPHAGOGASTRODUODENOSCOPY (EGD) WITH PROPOFOL N/A 12/29/2015   Procedure: ESOPHAGOGASTRODUODENOSCOPY (EGD) WITH PROPOFOL;  Surgeon: Lucilla Lame, MD;  Location: [facility];  Service: Endoscopy;  Laterality: N/A;  GASTRI BIOPSY  . TUBAL LIGATION      Social History Social History   Tobacco Use  . Smoking status: Never Smoker  . Smokeless tobacco: Never Used  Substance Use Topics  . Alcohol use: No  . Drug use: No    Family History Family History  Problem Relation Age of Onset  . Congestive Heart Failure Mother   . Heart attack Mother   . Dementia Father   . Heart attack Father   . Prostate cancer Father   . Atrial fibrillation Sister   . Hyperlipidemia Sister   .  Heart disease Brother     Allergies  Allergen Reactions  . Antihistamines, Chlorpheniramine-Type     Facial swelling   . Macrobid [Nitrofurantoin Macrocrystal]     unknown  . Statins     Muscle Pain, Weakness     REVIEW OF SYSTEMS(Negative unless checked)  Constitutional: [] ?Weight loss[] ?Fever[] ?Chills Cardiac:[] ?Chest pain[] ?Chest pressure[] ?Palpitations [] ?Shortness of breath when laying flat [] ?Shortness of breath at rest [] ?Shortness of breath with exertion. Vascular: [] ?Pain in legs with walking[] ?Pain in legsat rest[] ?Pain in legs when laying flat [] ?Claudication [] ?Pain in feet when walking [] ?Pain in feet at rest [] ?Pain in feet when laying flat  [] ?History of DVT [] ?Phlebitis [] ?Swelling in legs [] ?Varicose veins [] ?Non-healing ulcers Pulmonary: [] ?Uses home oxygen [] ?Productive cough[] ?Hemoptysis [] ?Wheeze [] ?COPD [] ?Asthma Neurologic: [] ?Dizziness [] ?Blackouts [] ?Seizures [] ?History of stroke [x] ?History of TIA[] ?Aphasia [] ?Temporary blindness[] ?Dysphagia [] ?Weaknessor numbness in arms [] ?Weakness or numbnessin legs Musculoskeletal: [x] ?Arthritis [] ?Joint swelling [] ?Joint pain [] ?Low back pain Hematologic:[] ?Easy bruising[] ?Easy bleeding [] ?Hypercoagulable state [] ?Anemic [] ?Hepatitis Gastrointestinal:[] ?Blood in stool[] ?Vomiting blood[x] ?Gastroesophageal reflux/heartburn[] ?Abdominal pain Genitourinary: [] ?Chronic kidney disease [] ?Difficulturination [] ?Frequenturination [] ?Burning with urination[] ?Hematuria Skin: [] ?Rashes [] ?Ulcers [] ?Wounds Psychological: [] ?History of anxiety[] ?History of major depression.   Physical Examination  Vitals:   01/09/19 1115  BP: 134/71  Pulse: 65  Resp: 16  Weight: 163 lb 12.8 oz (74.3 kg)   Body mass index is 26.84 kg/m. Gen:  WD/WN, NAD.  Appears younger than stated age Head: [place]/AT, No temporalis wasting. Ear/Nose/Throat: Hearing grossly intact, nares w/o erythema or drainage, trachea midline Eyes: Conjunctiva clear. Sclera non-icteric Neck: Supple.  Soft right bruit  Pulmonary:  Good air movement, equal and clear to auscultation bilaterally.  Cardiac: RRR, No JVD Vascular:  Vessel Right Left  Radial Palpable Palpable                   Musculoskeletal: M/S 5/5 throughout.  No deformity or atrophy.  No edema. Neurologic: CN 2-12 intact. Sensation grossly intact in extremities.  Symmetrical.  Speech is fluent. Motor exam as listed above. Psychiatric: Judgment intact, Mood & affect appropriate for pt's clinical situation. Dermatologic: No rashes or ulcers noted.  No cellulitis or open wounds.  Lymph : No Cervical, Axillary, or Inguinal lymphadenopathy.     CBC Lab Results  Component Value Date   WBC 4.9 11/04/2018   HGB 14.3 11/04/2018   HCT 44.7 11/04/2018   MCV 96.5 11/04/2018   PLT 219 11/04/2018    BMET    Component Value Date/Time   NA 138 11/04/2018 1159   NA 138 12/20/2017 0823   NA 138 03/17/2013 1443   K 3.8 11/04/2018 1159   K 3.6 03/17/2013 1443   CL 104 11/04/2018 1159   CL 105 03/17/2013 1443   CO2 25 11/04/2018 1159   CO2 30 03/17/2013 1443   GLUCOSE 189 (H) 11/04/2018 1159   GLUCOSE 192 (H) 03/17/2013 1443   BUN 13 11/04/2018 1159   BUN 18 12/20/2017 0823   BUN 16 03/17/2013 1443   CREATININE 0.60 11/04/2018 1159   CREATININE 0.72 03/17/2013 1443   CALCIUM 9.4 11/04/2018 1159   CALCIUM 9.1 03/17/2013 1443   GFRNONAA >60 11/04/2018 1159   GFRNONAA >60 03/17/2013 1443   GFRAA >60 11/04/2018 1159   GFRAA >60 03/17/2013 1443   CrCl cannot be calculated (Patient's most recent lab result is older than the maximum 21 days allowed.).  COAG No results found for: INR, PROTIME  Radiology No results found.   Assessment/Plan Controlled diabetes mellitus type II without complication blood glucose control important in reducing the progression of atherosclerotic  disease. Also, involved in wound healing.Well controlled at this time.   Hypercholesteremia Intolerant to statins. Does take fish oil.Keeping her cholesterol under control is important to reduce the risk of progression of atherosclerosis.  Carotid stenosis Carotid duplex today shows velocities in the upper end of the 40 to 59% range on the right and on the lower end of the 40 to 59% range on the left.  This is slightly improved from her velocities on the right 6 months ago which were on the low end of the 60 to 79% range.  She will continue her healthy lifestyles.  She is intolerant to statins.  We will continue to see her on 47-monthintervals at this point and she will contact our  office with any problems in the interim    JLeotis Pain MD  01/09/2019 11:51 AM    This note was created with Dragon medical transcription system.  Any errors from dictation are purely unintentional

## 2019-01-09 NOTE — Assessment & Plan Note (Signed)
Carotid duplex today shows velocities in the upper end of the 40 to 59% range on the right and on the lower end of the 40 to 59% range on the left.  This is slightly improved from her velocities on the right 6 months ago which were on the low end of the 60 to 79% range.  She will continue her healthy lifestyles.  She is intolerant to statins.  We will continue to see her on 40-month intervals at this point and she will contact our office with any problems in the interim

## 2019-01-11 IMAGING — MG MM DIGITAL DIAGNOSTIC UNILAT*R* W/ TOMO W/ CAD
6 series · 6 of 14 positions shown · non-contrast
Comparison: Previous exam(s).

CLINICAL DATA: Follow-up of probably benign right breast 6 o'clock
nodule.

EXAM:
2D DIGITAL DIAGNOSTIC RIGHT MAMMOGRAM WITH CAD AND ADJUNCT TOMO
ULTRASOUND RIGHT BREAST

[R CC]
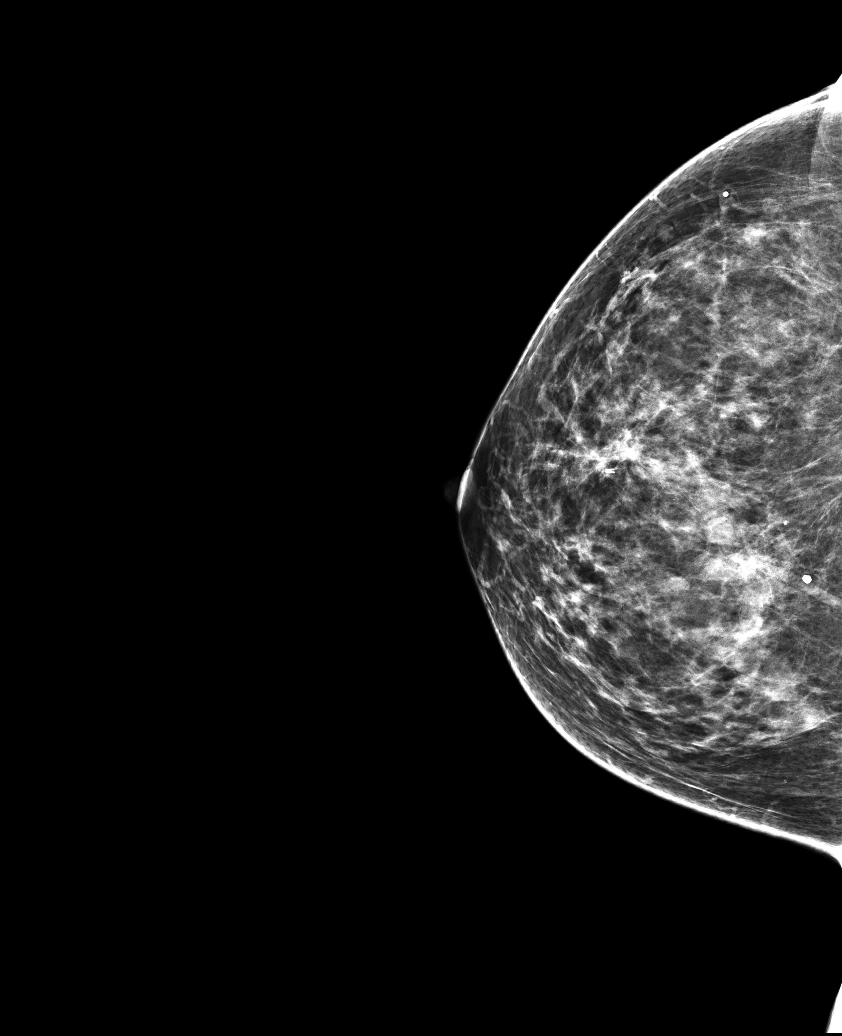

[R MLO]
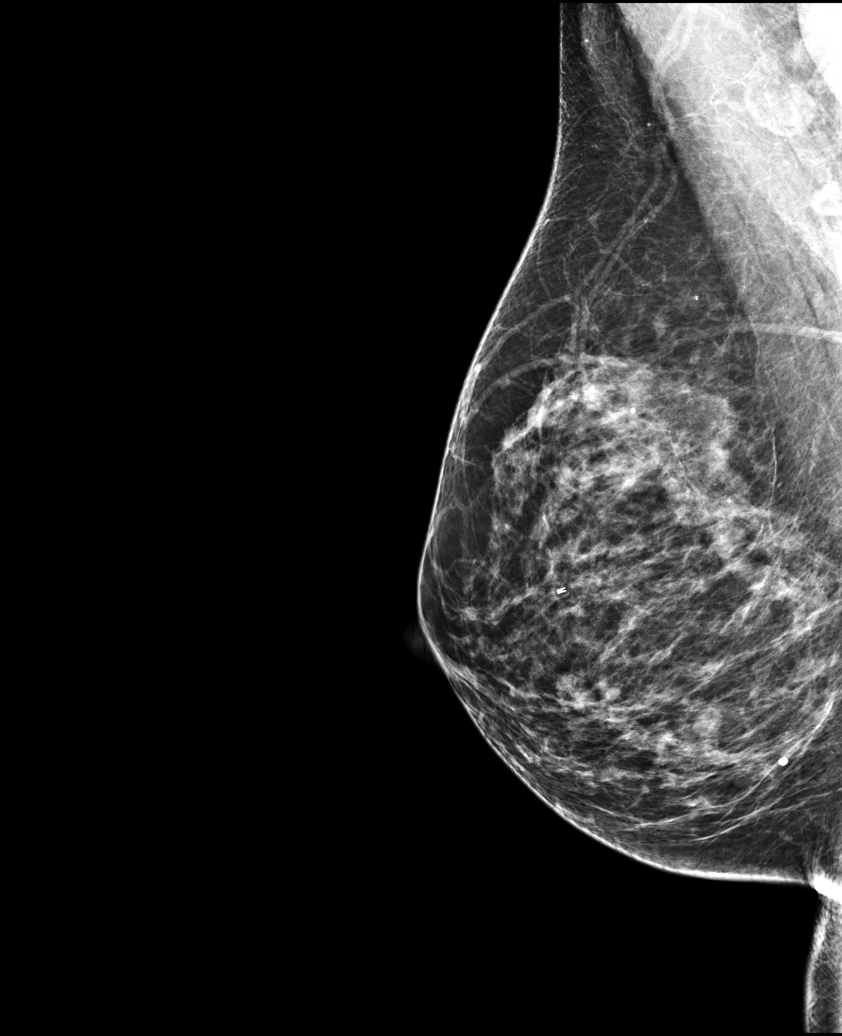

[R MLO synth-2D]
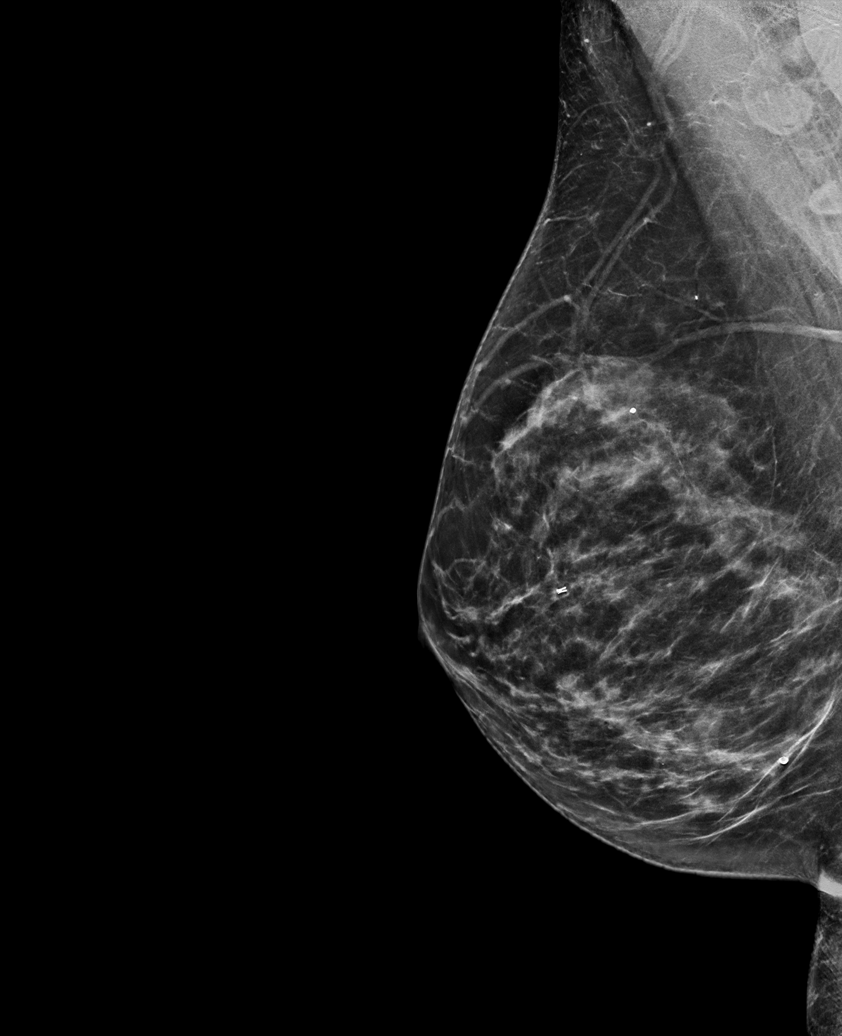

[R CC synth-2D]
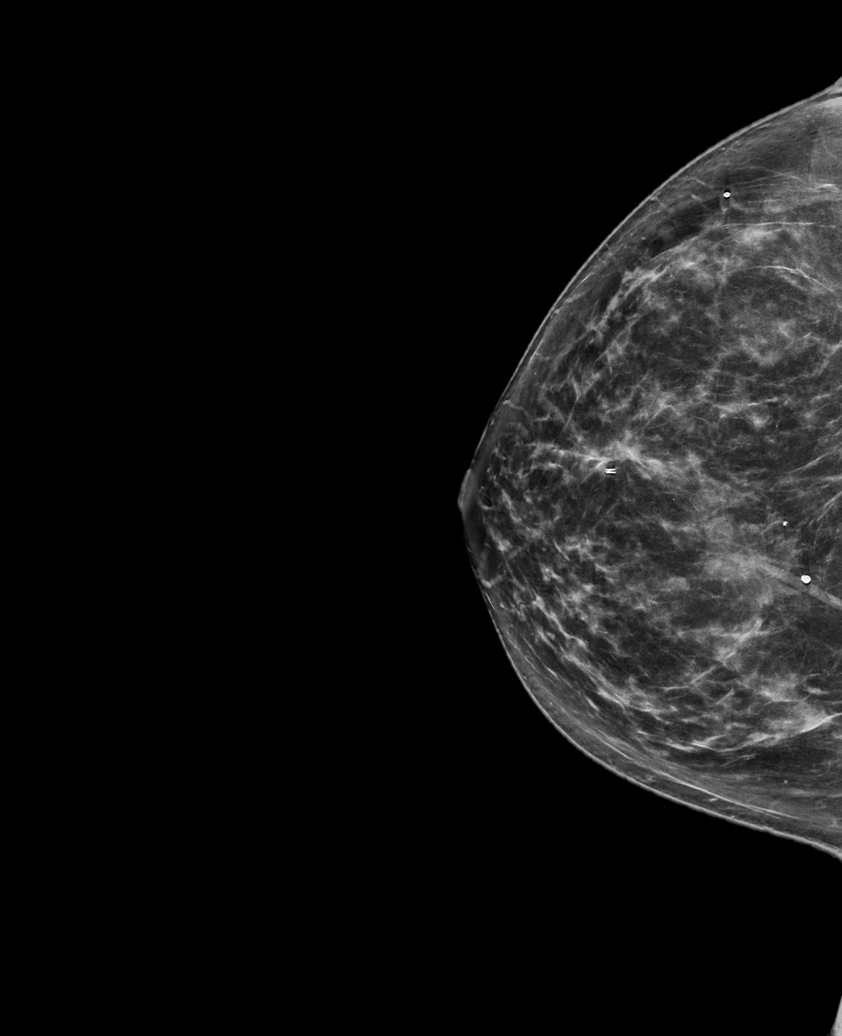

[R CC tomo · tomo slice 39/77.0]
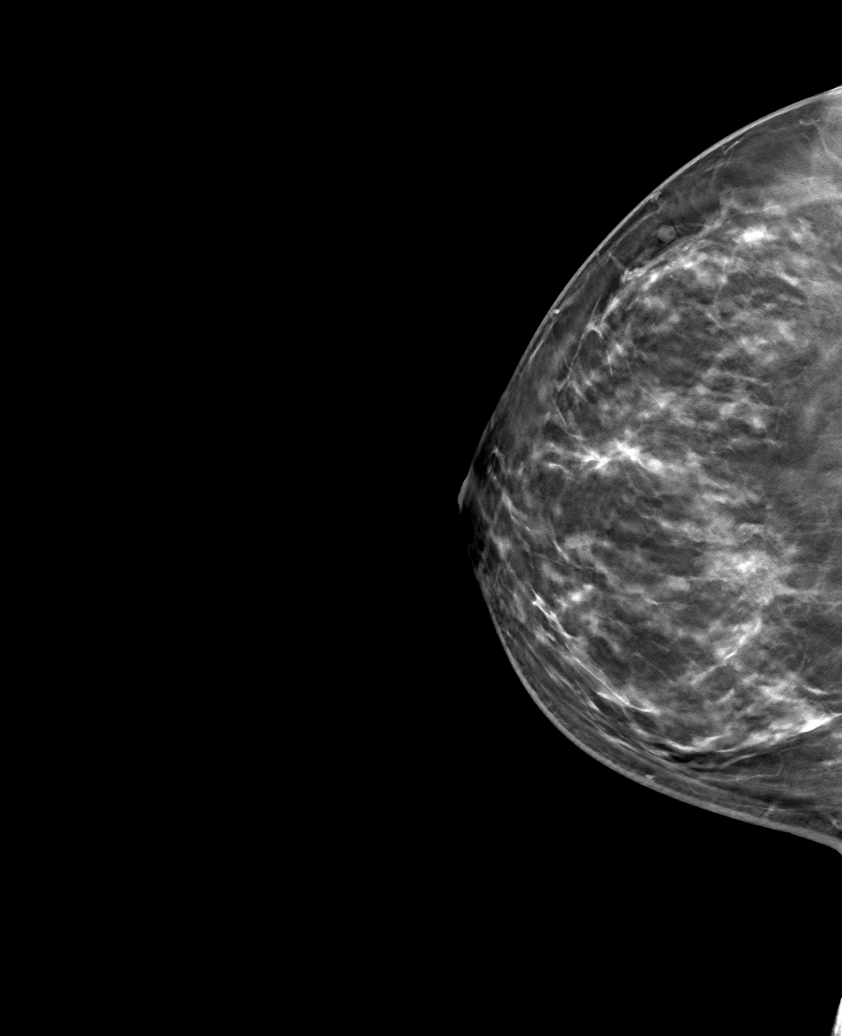

[R MLO tomo · tomo slice 38/75.0]
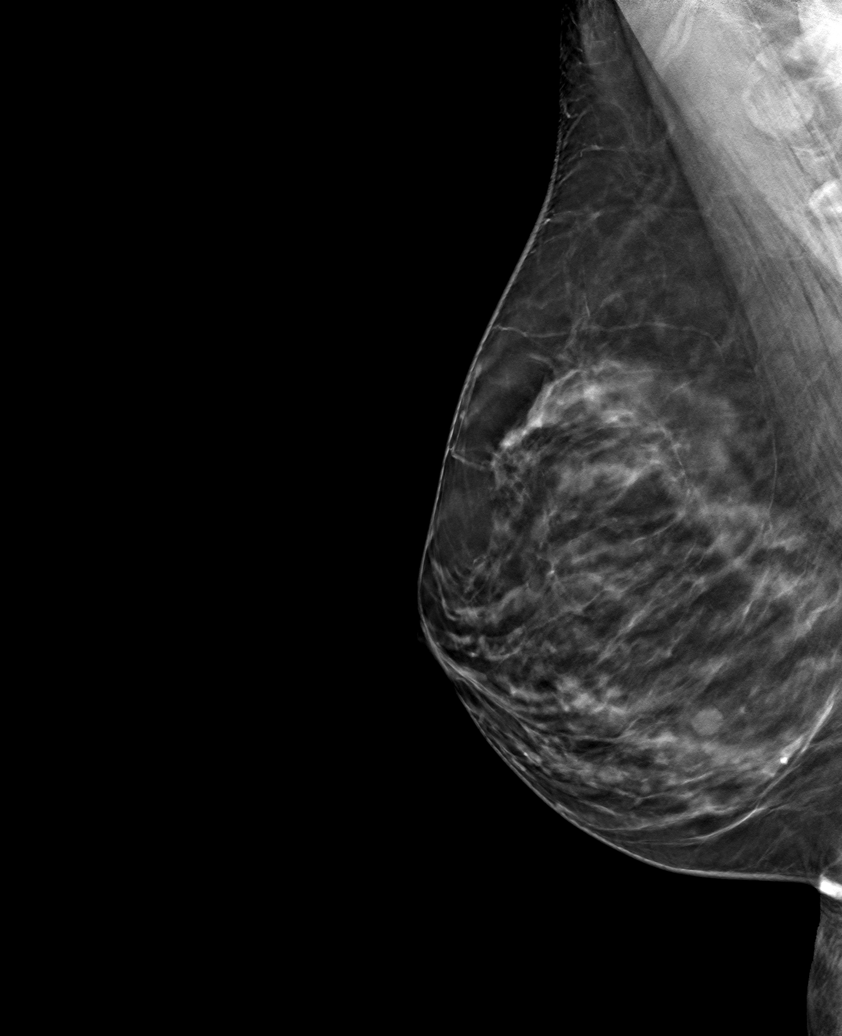

[6 of 14 positions shown; findings below may reference images not displayed]

ACR Breast Density Category c: The breast tissue is heterogeneously
dense, which may obscure small masses.
FINDINGS: Mammographic views of the right breast demonstrate persistent
circumscribed isodense to breast parenchyma 8 mm nodule in the right
6 o'clock breast, stable.

Mammographic images were processed with CAD.

On physical exam, no suspicious masses are palpated.

Targeted ultrasound is performed, showing right breast 6 o'clock 4
cm from the nipple benign-appearing hypoechoic circumscribed 6 x 5 x
6 mm nodule, likely representing a complicated cyst.
IMPRESSION: Stable benign-appearing right breast 6 o'clock nodule.

RECOMMENDATION:
Diagnostic mammogram and possibly ultrasound of the right breast in
6 months. (Code:[id number])

I have discussed the findings and recommendations with the patient.
Results were also provided in writing at the conclusion of the
visit. If applicable, a reminder letter will be sent to the patient
regarding the next appointment.

BI-RADS CATEGORY  3: Probably benign.

## 2019-02-05 DIAGNOSIS — H35351 Cystoid macular degeneration, right eye: Secondary | ICD-10-CM | POA: Diagnosis not present

## 2019-02-12 ENCOUNTER — Encounter: Payer: Self-pay | Admitting: Physician Assistant

## 2019-03-07 ENCOUNTER — Other Ambulatory Visit: Payer: Self-pay

## 2019-03-07 ENCOUNTER — Encounter: Payer: Self-pay | Admitting: Physician Assistant

## 2019-03-07 ENCOUNTER — Ambulatory Visit (INDEPENDENT_AMBULATORY_CARE_PROVIDER_SITE_OTHER): Payer: Medicare HMO | Admitting: Physician Assistant

## 2019-03-07 VITALS — BP 116/67 | HR 66 | Temp 98.3°F | Resp 16 | Ht 66.0 in | Wt 165.2 lb

## 2019-03-07 DIAGNOSIS — N814 Uterovaginal prolapse, unspecified: Secondary | ICD-10-CM | POA: Diagnosis not present

## 2019-03-07 DIAGNOSIS — E559 Vitamin D deficiency, unspecified: Secondary | ICD-10-CM | POA: Diagnosis not present

## 2019-03-07 DIAGNOSIS — Z0000 Encounter for general adult medical examination without abnormal findings: Secondary | ICD-10-CM | POA: Diagnosis not present

## 2019-03-07 DIAGNOSIS — Z1239 Encounter for other screening for malignant neoplasm of breast: Secondary | ICD-10-CM | POA: Diagnosis not present

## 2019-03-07 DIAGNOSIS — E039 Hypothyroidism, unspecified: Secondary | ICD-10-CM | POA: Diagnosis not present

## 2019-03-07 DIAGNOSIS — E7800 Pure hypercholesterolemia, unspecified: Secondary | ICD-10-CM

## 2019-03-07 DIAGNOSIS — Z13820 Encounter for screening for osteoporosis: Secondary | ICD-10-CM | POA: Diagnosis not present

## 2019-03-07 DIAGNOSIS — E119 Type 2 diabetes mellitus without complications: Secondary | ICD-10-CM | POA: Diagnosis not present

## 2019-03-07 NOTE — Patient Instructions (Signed)
Pelvic Organ Prolapse Pelvic organ prolapse is the stretching, bulging, or dropping of pelvic organs into an abnormal position. It happens when the muscles and tissues that surround and support pelvic structures become weak or stretched. Pelvic organ prolapse can involve the:  Vagina (vaginal prolapse).  Uterus (uterine prolapse).  Bladder (cystocele).  Rectum (rectocele).  Intestines (enterocele). When organs other than the vagina are involved, they often bulge into the vagina or protrude from the vagina, depending on how severe the prolapse is. What are the causes? This condition may be caused by:  Pregnancy, labor, and childbirth.  Past pelvic surgery.  Decreased production of the hormone estrogen associated with menopause.  Consistently lifting more than 50 lb (23 kg).  Obesity.  Long-term inability to pass stool (chronic constipation).  A cough that lasts a long time (chronic).  Buildup of fluid in the abdomen due to certain diseases and other conditions. What are the signs or symptoms? Symptoms of this condition include:  Passing a little urine (loss of bladder control) when you cough, sneeze, strain, and exercise (stress incontinence). This may be worse immediately after childbirth. It may gradually improve over time.  Feeling pressure in your pelvis or vagina. This pressure may increase when you cough or when you are passing stool.  A bulge that protrudes from the opening of your vagina.  Difficulty passing urine or stool.  Pain in your lower back.  Pain, discomfort, or disinterest in sex.  Repeated bladder infections (urinary tract infections).  Difficulty inserting a tampon. In some people, this condition causes no symptoms. How is this diagnosed? This condition may be diagnosed based on a vaginal and rectal exam. During the exam, you may be asked to cough and strain while you are lying down, sitting, and standing up. Your health care provider will  determine if other tests are required, such as bladder function tests. How is this treated? Treatment for this condition may depend on your symptoms. Treatment may include:  Lifestyle changes, such as changes to your diet.  Emptying your bladder at scheduled times (bladder training therapy). This can help reduce or avoid urinary incontinence.  Estrogen. Estrogen may help mild prolapse by increasing the strength and tone of pelvic floor muscles.  Kegel exercises. These may help mild cases of prolapse by strengthening and tightening the muscles of the pelvic floor.  A soft, flexible device that helps support the vaginal walls and keep pelvic organs in place (pessary). This is inserted into your vagina by your health care provider.  Surgery. This is often the only form of treatment for severe prolapse. Follow these instructions at home:  Avoid drinking beverages that contain caffeine or alcohol.  Increase your intake of high-fiber foods. This can help decrease constipation and straining during bowel movements.  Lose weight if recommended by your health care provider.  Wear a sanitary pad or adult diapers if you have urinary incontinence.  Avoid heavy lifting and straining with exercise and work. Do not hold your breath when you perform mild to moderate lifting and exercise activities. Limit your activities as directed by your health care provider.  Do Kegel exercises as directed by your health care provider. To do this: ? Squeeze your pelvic floor muscles tight. You should feel a tight lift in your rectal area and a tightness in your vaginal area. Keep your stomach, buttocks, and legs relaxed. ? Hold the muscles tight for up to 10 seconds. ? Relax your muscles. ? Repeat this exercise 50 times a day,   or as many times as told by your health care provider. Continue to do this exercise for at least 4-6 weeks, or for as long as told by your health care provider.  Take over-the-counter and  prescription medicines only as told by your health care provider.  If you have a pessary, take care of it as told by your health care provider.  Keep all follow-up visits as told by your health care provider. This is important. Contact a health care provider if you:  Have symptoms that interfere with your daily activities or sex life.  Need medicine to help with the discomfort.  Notice bleeding from your vagina that is not related to your period.  Have a fever.  Have pain or bleeding when you urinate.  Have bleeding when you pass stool.  Pass urine when you have sex.  Have chronic constipation.  Have a pessary that falls out.  Have bad smelling vaginal discharge.  Have an unusual, low pain in your abdomen. Summary  Pelvic organ prolapse is the stretching, bulging, or dropping of pelvic organs into an abnormal position. It happens when the muscles and tissues that surround and support pelvic structures become weak or stretched.  When organs other than the vagina are involved, they often bulge into the vagina or protrude from the vagina, depending on how severe the prolapse is.  In most cases, this condition needs to be treated only if it produces symptoms. Treatment may include lifestyle changes, estrogen, Kegel exercises, pessary insertion, or surgery.  Avoid heavy lifting and straining with exercise and work. Do not hold your breath when you perform mild to moderate lifting and exercise activities. Limit your activities as directed by your health care provider. This information is not intended to replace advice given to you by your health care provider. Make sure you discuss any questions you have with your health care provider. Document Released: 03/13/2014 Document Revised: 09/07/2017 Document Reviewed: 09/07/2017 Elsevier Patient Education  2020 [employer]  General Guidelines for Pelvic Floor Exercise  Challenge your muscles to do more than they  are used to doing. The quality of the exercise is more important that the number you perform.  Avoid straining, holding your breath or using buttock or leg muscles while you exercise the pelvic floor muscles.  Count out loud and continue breathing to avoid straining.  Relax your body and breathe during your exercises.  Coordinate your breathing with your pelvic floor contraction by blowing out or exhaling while you contract your pelvic floor muscles.   Concentrate on activating both the sphincters and levator ani muscles of the pelvic floor with each exercise.  Position for the Exercises  Start lying down with your knees bent and supported with pillows.  Once you've gained awareness and can feel the contractions you may perform the exercises either sitting or standing.  For example, you can do them while driving, working on the computer, or waiting in lines.  Quick Contractions  Repeat this exercise 10 times.  Do the exercise 3 times per day.  Rapidly contract your pelvic floor muscles and hold for 2 seconds relax for 2 seconds.  Try to do the contraction on breathing exhalation.  Endurance Contractions  Repeat this 3-5 times.  Do the exercise 3 times per day.  Pull your pelvic floor muscles up and in and hold for 20 seconds then relax for 20 seconds.  Count out loud while you are holding the contraction to make sure that  you are breathing throughout the exercise and not straining.   2007, [employer] Doc.37

## 2019-03-07 NOTE — Progress Notes (Signed)
Patient: Christine Sandford., Female    DOB: [birth year], 73 y.o.   MRN: [id number] Visit Date: 03/07/2019  Today's Provider: Mar Daring, PA-C   Chief Complaint  Patient presents with  . Annual Exam   Subjective:   Patient had her wellness [facility] with [facility] 12/18/2018   Complete Physical Christine Richards. is a 73 y.o. female. She feels well. She reports exercising some. She reports she is sleeping well. -----------------------------------------------------------   Review of Systems  Constitutional: Negative.   HENT: Positive for tinnitus.   Eyes: Positive for photophobia.  Respiratory: Positive for apnea.   Cardiovascular: Negative.   Gastrointestinal: Negative.   Endocrine: Negative.   Genitourinary: Positive for difficulty urinating and urgency.  Musculoskeletal: Positive for arthralgias.  Skin: Negative.   Allergic/Immunologic: Negative.   Neurological: Negative.   Hematological: Negative.   Psychiatric/Behavioral: Negative.     Social History   Socioeconomic History  . Marital status: Widowed    Spouse name: Not on file  . Number of children: 2  . Years of education: College  . Highest education level: Bachelor's degree (e.g., BA, AB, BS)  Occupational History  . Occupation: [employer]: Full Time  Social Needs  . Financial resource strain: Not hard at all  . Food insecurity    Worry: Never true    Inability: Never true  . Transportation needs    Medical: No    Non-medical: No  Tobacco Use  . Smoking status: Never Smoker  . Smokeless tobacco: Never Used  Substance and Sexual Activity  . Alcohol use: No  . Drug use: No  . Sexual activity: Not on file  Lifestyle  . Physical activity    Days per week: 5 days    Minutes per session: 30 min  . Stress: Not at all  Relationships  . Social Herbalist on phone: Patient refused    Gets together: Patient refused    Attends religious service: Patient refused     Active member of club or organization: Patient refused    Attends meetings of clubs or organizations: Patient refused    Relationship status: Patient refused  . Intimate partner violence    Fear of current or ex partner: Patient refused    Emotionally abused: Patient refused    Physically abused: Patient refused    Forced sexual activity: Patient refused  Other Topics Concern  . Not on file  Social History Narrative  . Not on file    Past Medical History:  Diagnosis Date  . Arthritis    rheumatoid  . Diabetes mellitus without complication ([place])   . Diverticulosis   . Environmental and seasonal allergies   . GERD (gastroesophageal reflux disease)   . Hyperlipidemia   . Osteopenia   . Sinus congestion   . Sleep apnea    uses CPAP  . Stroke [facility]    TIA 6 years ago no issues since  . TIA (transient ischemic attack)      Patient Active Problem List   Diagnosis Date Noted  . Heartburn   . Gastritis   . Situational anxiety 11/20/2015  . Arthritis 02/11/2015  . Allergic rhinitis 12/25/2014  . Aneurysm ([place]) 12/25/2014  . Carotid stenosis 12/25/2014  . Controlled diabetes mellitus type II without complication ([place]) 62/26/3335  . Aerophobia 12/25/2014  . Fibrositis 12/25/2014  . Headache, migraine 12/25/2014  . Osteopenia 12/25/2014  . Avitaminosis D 12/25/2014  .  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 Personal history of TIA (transient ischemic attack) 09/01/2009  . 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 Acquired hypothyroidism 04/25/2009  . Hypercholesteremia 02/20/2007  . 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 Apnea, sleep 12/02/2006    Past Surgical History:  Procedure Laterality Date  . BREAST BIOPSY Right 2010   benign  . BREAST CYST ASPIRATION Right   . BREAST SURGERY Right 2010   biopsy  . CATARACT EXTRACTION W/PHACO Right 11/11/2016   Procedure: CATARACT EXTRACTION PHACO AND INTRAOCULAR LENS PLACEMENT (IOC);  Surgeon:  Eulogio Bear, MD;  Location: [facility] ORS;  Service: Ophthalmology;  Laterality: Right;  [place] 1:04.9 AP% 14.6 CDE 9.64 Fluid pack lot # [id number]  . ESOPHAGOGASTRODUODENOSCOPY (EGD) WITH PROPOFOL N/A 12/29/2015   Procedure: ESOPHAGOGASTRODUODENOSCOPY (EGD) WITH PROPOFOL;  Surgeon: Lucilla Lame, MD;  Location: [facility];  Service: Endoscopy;  Laterality: N/A;  GASTRI BIOPSY  . TUBAL LIGATION      Her family history includes Atrial fibrillation in her sister; Congestive Heart Failure in her mother; Dementia in her father; Heart attack in her father and mother; Heart disease in her brother; Hyperlipidemia in her sister; Prostate cancer in her father.   Current Outpatient Medications:  .  Blood Glucose Monitoring Suppl ([place]) w/Device KIT, New kit with device to check blood sugar once daily, Disp: 1 kit, Rfl: 0 .  Cholecalciferol (VITAMIN D) 2000 UNITS CAPS, Take 2,000 Units by mouth 2 (two) times daily. , Disp: , Rfl:  .  glucose blood (ONETOUCH VERIO) test strip, To check BS BID, Disp: 200 each, Rfl: 12 .  Lancets (ONETOUCH ULTRASOFT) lancets, To check blood sugar BID, Disp: 200 each, Rfl: 12 .  Omega-3 Fatty Acids (FISH OIL) 1000 MG CAPS, Take 2,000 mg by mouth at bedtime. , Disp: , Rfl:  .  ALPRAZolam (XANAX) 0.5 MG tablet, Take 1 tablet (0.5 mg total) by mouth at bedtime as needed for anxiety. (Patient not taking: Reported on 03/07/2019), Disp: 10 tablet, Rfl: 0  Patient Care Team: Mar Daring, PA-C as PCP - General (Family Medicine) Arelia Sneddon, Makena as Consulting Physician (Optometry) Leandrew Koyanagi, MD as Referring Physician (Ophthalmology)     Objective:    Vitals: BP 116/67 (BP Location: Left Arm, Patient Position: Sitting, Cuff Size: Large)   Pulse 66   Temp 98.3 F (36.8 C) (Oral)   Resp 16   Ht _0  (1.676 m)   Wt 165 lb 3.2 oz (74.9 kg)   BMI 26.66 kg/m   Physical Exam Vitals signs reviewed.  Constitutional:      General:  She is not in acute distress.    Appearance: Normal appearance. She is well-developed. She is not ill-appearing or diaphoretic.  HENT:     Head: Normocephalic and atraumatic.     Right Ear: Tympanic membrane, ear canal and external ear normal.     Left Ear: Tympanic membrane, ear canal and external ear normal.     Nose: Nose normal.     Mouth/Throat:     Mouth: Mucous membranes are moist.     Pharynx: Oropharynx is clear. No oropharyngeal exudate.  Eyes:     General: No scleral icterus.       Right eye: No discharge.        Left eye: No discharge.     Extraocular Movements: Extraocular movements intact.     Conjunctiva/sclera: Conjunctivae normal.     Pupils: Pupils are equal, round,  and reactive to light.  Neck:     Musculoskeletal: Normal range of motion and neck supple.     Thyroid: No thyromegaly.     Vascular: No carotid bruit or JVD.     Trachea: No tracheal deviation.  Cardiovascular:     Rate and Rhythm: Normal rate and regular rhythm.     Pulses: Normal pulses.     Heart sounds: Normal heart sounds. No murmur. No friction rub. No gallop.   Pulmonary:     Effort: Pulmonary effort is normal. No respiratory distress.     Breath sounds: Normal breath sounds. No wheezing or rales.  Chest:     Chest wall: No tenderness.  Abdominal:     General: Abdomen is flat. Bowel sounds are normal. There is no distension.     Palpations: Abdomen is soft. There is no mass.     Tenderness: There is no abdominal tenderness. There is no guarding or rebound.  Genitourinary:    Comments: Patient declined Musculoskeletal: Normal range of motion.        General: No tenderness.  Lymphadenopathy:     Cervical: No cervical adenopathy.  Skin:    General: Skin is warm and dry.     Capillary Refill: Capillary refill takes less than 2 seconds.     Findings: No rash.  Neurological:     General: No focal deficit present.     Mental Status: She is alert and oriented to person, place, and time.  Mental status is at baseline.     Cranial Nerves: No cranial nerve deficit.     Motor: No weakness.     Gait: Gait normal.  Psychiatric:        Mood and Affect: Mood normal.        Behavior: Behavior normal.        Thought Content: Thought content normal.        Judgment: Judgment normal.     Activities of Daily Living In your present state of health, do you have any difficulty performing the following activities: 12/18/2018  Hearing? Y  Comment Has hearing loss in both ears. Does not wear hearing aids at this time.   Vision? N  Difficulty concentrating or making decisions? N  Walking or climbing stairs? N  Dressing or bathing? N  Doing errands, shopping? N  Preparing Food and eating ? N  Using the Toilet? N  In the past six months, have you accidently leaked urine? N  Do you have problems with loss of bowel control? N  Managing your Medications? N  Managing your Finances? N  Housekeeping or managing your Housekeeping? N  Some recent data might be hidden    Fall Risk Assessment Fall Risk  12/18/2018 12/15/2017 03/31/2017 12/09/2016 08/06/2015  Falls in the past year? 0 No No No No     Depression Screen PHQ 2/9 Scores 12/18/2018 07/07/2018 12/15/2017 03/31/2017  PHQ - 2 Score 0 0 0 0  PHQ- 9 Score - 0 - -   Cognitive Function: Declined on 11/2018 with [facility] 6CIT Screen 12/09/2016  What Year? 0 points  What month? 0 points  What time? 0 points  Count back from 20 0 points  Months in reverse 0 points  Repeat phrase 0 points  Total Score 0       Assessment & Plan:    Annual Physical Reviewed patient's Family Medical History Reviewed and updated list of patient's medical providers Assessment of cognitive impairment was done Assessed patient's functional  ability Established a written schedule for health screening Christine Richards Completed and Reviewed  Exercise Activities and Dietary recommendations Goals    . DIET - INCREASE WATER INTAKE     Recommend  increasing water intake to 4-6 glasses a day.     . Increase water intake     Recommend increasing water intake to 3-4 glasses a day.       Immunization History  Administered Date(s) Administered  . Pneumococcal Conjugate-13 08/06/2015  . Td 12/22/2007  . Tdap 12/22/2007    Health Maintenance  Topic Date Due  . PNA vac Low Risk Adult (2 of 2 - PPSV23) 08/05/2016  . URINE MICROALBUMIN  12/09/2017  . HEMOGLOBIN A1C  01/05/2019  . TETANUS/TDAP  07/08/2019 (Originally 12/21/2017)  . INFLUENZA VACCINE  11/28/2019 (Originally 03/31/2019)  . MAMMOGRAM  04/07/2019  . DEXA SCAN  06/27/2019  . FOOT EXAM  07/08/2019  . OPHTHALMOLOGY EXAM  07/21/2019  . COLONOSCOPY  12/14/2021  . Hepatitis C Screening  Completed     Discussed health benefits of physical activity, and encouraged her to engage in regular exercise appropriate for her age and condition.   1. Annual physical exam Normal physical exam today. Will check labs as below and f/u pending lab results. If labs are stable and WNL she will not need to have these rechecked for one year at her next annual physical exam. She is to call the office in the meantime if she has any acute issue, questions or concerns. - CBC with Differential/Platelet - Comprehensive metabolic panel - Hemoglobin A1c - Lipid panel  2. Breast cancer screening There is no family history of breast cancer. She does perform regular self breast exams. Mammogram was ordered as below. Information for [facility] clinic was given to patient so she may schedule her mammogram at her convenience. - MM 3D SCREEN BREAST BILATERAL; Future  3. Osteoporosis screening Last BMD was 2013. Due for repeat screening. - DG Bone Density; Future  4. Acquired hypothyroidism Asymptomatic. Will check labs as below and f/u pending results. - CBC with Differential/Platelet - Comprehensive metabolic panel - TSH  5. Controlled type 2 diabetes mellitus without complication, without  long-term current use of insulin (HCC) Diet controlled. Patient has declined meds in the past. Will check labs as below and f/u pending results. - CBC with Differential/Platelet - Comprehensive metabolic panel - Hemoglobin A1c - Lipid panel  6. Avitaminosis D H/O this. Will check labs as below and f/u pending results. - CBC with Differential/Platelet - Comprehensive metabolic panel  7. Hypercholesteremia Intolerant to statins and declines. Will check labs as below and f/u pending results. - CBC with Differential/Platelet - Comprehensive metabolic panel - Hemoglobin A1c - Lipid panel  8. Cystocele with prolapse Patient reports she has been noticing a "bulge" in her vaginal cavity and when she had the urine retention episode and had to go to the ER she was told she needed to see a GYN for possible treatment. Patient declined pelvic exam and reports she is going to try conservative management at this time and will call if symptoms worsen enough she desires referral.    ------------------------------------------------------------------------------------------------------------    Mar Daring, PA-C  [place]

## 2019-03-08 ENCOUNTER — Telehealth: Payer: Self-pay

## 2019-03-08 DIAGNOSIS — E119 Type 2 diabetes mellitus without complications: Secondary | ICD-10-CM | POA: Diagnosis not present

## 2019-03-08 DIAGNOSIS — Z0000 Encounter for general adult medical examination without abnormal findings: Secondary | ICD-10-CM | POA: Diagnosis not present

## 2019-03-08 DIAGNOSIS — E7800 Pure hypercholesterolemia, unspecified: Secondary | ICD-10-CM | POA: Diagnosis not present

## 2019-03-08 DIAGNOSIS — E559 Vitamin D deficiency, unspecified: Secondary | ICD-10-CM | POA: Diagnosis not present

## 2019-03-08 DIAGNOSIS — E039 Hypothyroidism, unspecified: Secondary | ICD-10-CM | POA: Diagnosis not present

## 2019-03-08 NOTE — Telephone Encounter (Signed)
Patient called stating that she tried calling [facility] to schedule her diagnostic mammogram. She was told by [facility] Poli that the order was put in wrong and needed to be changed. She says they will not allow her to schedule an appointment until the orders are changed.  Patient is requesting that we send the correct orders to [facility]. Patient says she is also supposed to be having a bone density test. Please advise.

## 2019-03-09 LAB — COMPREHENSIVE METABOLIC PANEL
ALT: 12 IU/L (ref 0–32)
AST: 18 IU/L (ref 0–40)
Albumin/Globulin Ratio: 2.4 — ABNORMAL HIGH (ref 1.2–2.2)
Albumin: 4.7 g/dL (ref 3.7–4.7)
Alkaline Phosphatase: 84 IU/L (ref 39–117)
BUN/Creatinine Ratio: 27 (ref 12–28)
BUN: 21 mg/dL (ref 8–27)
Bilirubin Total: 0.6 mg/dL (ref 0.0–1.2)
CO2: 22 mmol/L (ref 20–29)
Calcium: 9.7 mg/dL (ref 8.7–10.3)
Chloride: 97 mmol/L (ref 96–106)
Creatinine, Ser: 0.78 mg/dL (ref 0.57–1.00)
GFR calc Af Amer: 87 mL/min/{1.73_m2} (ref >59)
GFR calc non Af Amer: 76 mL/min/{1.73_m2} (ref >59)
Globulin, Total: 2 g/dL (ref 1.5–4.5)
Glucose: 190 mg/dL — ABNORMAL HIGH (ref 65–99)
Potassium: 3.9 mmol/L (ref 3.5–5.2)
Sodium: 136 mmol/L (ref 134–144)
Total Protein: 6.7 g/dL (ref 6.0–8.5)

## 2019-03-09 LAB — TSH: TSH: 2.52 u[IU]/mL (ref 0.450–4.500)

## 2019-03-09 LAB — CBC WITH DIFFERENTIAL/PLATELET
Basophils Absolute: 0 10*3/uL (ref 0.0–0.2)
Basos: 1 %
EOS (ABSOLUTE): 0.1 10*3/uL (ref 0.0–0.4)
Eos: 2 %
Hematocrit: 42.6 % (ref 34.0–46.6)
Hemoglobin: 14.3 g/dL (ref 11.1–15.9)
Immature Grans (Abs): 0 10*3/uL (ref 0.0–0.1)
Immature Granulocytes: 0 %
Lymphocytes Absolute: 1.6 10*3/uL (ref 0.7–3.1)
Lymphs: 30 %
MCH: 31.7 pg (ref 26.6–33.0)
MCHC: 33.6 g/dL (ref 31.5–35.7)
MCV: 95 fL (ref 79–97)
Monocytes Absolute: 0.3 10*3/uL (ref 0.1–0.9)
Monocytes: 7 %
Neutrophils Absolute: 3.2 10*3/uL (ref 1.4–7.0)
Neutrophils: 60 %
Platelets: 234 10*3/uL (ref 150–450)
RBC: 4.51 x10E6/uL (ref 3.77–5.28)
RDW: 12.2 % (ref 11.7–15.4)
WBC: 5.2 10*3/uL (ref 3.4–10.8)

## 2019-03-09 LAB — HEMOGLOBIN A1C
Est. average glucose Bld gHb Est-mCnc: 186 mg/dL
Hgb A1c MFr Bld: 8.1 % — ABNORMAL HIGH (ref 4.8–5.6)

## 2019-03-09 LAB — LIPID PANEL
Chol/HDL Ratio: 6.3 ratio — ABNORMAL HIGH (ref 0.0–4.4)
Cholesterol, Total: 383 mg/dL — ABNORMAL HIGH (ref 100–199)
HDL: 61 mg/dL (ref >39)
LDL Calculated: 285 mg/dL — ABNORMAL HIGH (ref 0–99)
Triglycerides: 186 mg/dL — ABNORMAL HIGH (ref 0–149)
VLDL Cholesterol Cal: 37 mg/dL (ref 5–40)

## 2019-03-09 NOTE — Telephone Encounter (Signed)
Mammogram changed and bone density was ordered day of CPE on 7/8

## 2019-03-09 NOTE — Telephone Encounter (Signed)
Patient was advised. Patient was requesting her lab results. Please advise?

## 2019-03-09 NOTE — Telephone Encounter (Signed)
Please advise orders?

## 2019-03-09 NOTE — Addendum Note (Signed)
Addended by: Mar Daring on: 03/09/2019 01:26 PM   Modules accepted: Orders

## 2019-03-12 ENCOUNTER — Telehealth: Payer: Self-pay

## 2019-03-12 NOTE — Telephone Encounter (Signed)
-----   Message from Mar Daring, [place] sent at 03/09/2019  3:50 PM EDT ----- Blood count is normal. Kidney and liver function are normal. Sodium, potassium and calcium are normal. A1c is up again from 7.9 to 8.1. Cholesterol is also elevated still but essentially stable. Thyroid is normal.

## 2019-03-12 NOTE — Telephone Encounter (Signed)
Patient advised.

## 2019-03-20 ENCOUNTER — Other Ambulatory Visit: Payer: Self-pay | Admitting: Physician Assistant

## 2019-03-20 DIAGNOSIS — R928 Other abnormal and inconclusive findings on diagnostic imaging of breast: Secondary | ICD-10-CM

## 2019-04-19 DIAGNOSIS — G4733 Obstructive sleep apnea (adult) (pediatric): Secondary | ICD-10-CM | POA: Diagnosis not present

## 2019-05-03 ENCOUNTER — Ambulatory Visit
Admission: RE | Admit: 2019-05-03 | Discharge: 2019-05-03 | Disposition: A | Discharge status: Home / Self Care | Payer: Medicare HMO | Source: Ambulatory Visit | Attending: Physician Assistant | Admitting: Physician Assistant

## 2019-05-03 DIAGNOSIS — Z13820 Encounter for screening for osteoporosis: Secondary | ICD-10-CM | POA: Insufficient documentation

## 2019-05-03 DIAGNOSIS — Z1239 Encounter for other screening for malignant neoplasm of breast: Secondary | ICD-10-CM

## 2019-05-03 DIAGNOSIS — M8589 Other specified disorders of bone density and structure, multiple sites: Secondary | ICD-10-CM | POA: Diagnosis not present

## 2019-05-03 DIAGNOSIS — R922 Inconclusive mammogram: Secondary | ICD-10-CM | POA: Diagnosis not present

## 2019-05-03 DIAGNOSIS — N6313 Unspecified lump in the right breast, lower outer quadrant: Secondary | ICD-10-CM | POA: Diagnosis not present

## 2019-05-03 DIAGNOSIS — R928 Other abnormal and inconclusive findings on diagnostic imaging of breast: Secondary | ICD-10-CM | POA: Insufficient documentation

## 2019-05-03 DIAGNOSIS — N6314 Unspecified lump in the right breast, lower inner quadrant: Secondary | ICD-10-CM | POA: Diagnosis not present

## 2019-05-03 DIAGNOSIS — Z780 Asymptomatic menopausal state: Secondary | ICD-10-CM | POA: Diagnosis not present

## 2019-05-09 ENCOUNTER — Encounter: Payer: Self-pay | Admitting: Physician Assistant

## 2019-05-21 ENCOUNTER — Encounter: Payer: Self-pay | Admitting: Family Medicine

## 2019-05-21 ENCOUNTER — Telehealth (INDEPENDENT_AMBULATORY_CARE_PROVIDER_SITE_OTHER): Payer: Medicare HMO | Admitting: Family Medicine

## 2019-05-21 DIAGNOSIS — Z20828 Contact with and (suspected) exposure to other viral communicable diseases: Secondary | ICD-10-CM

## 2019-05-21 DIAGNOSIS — Z20822 Contact with and (suspected) exposure to covid-19: Secondary | ICD-10-CM

## 2019-05-21 NOTE — Patient Instructions (Signed)

## 2019-05-21 NOTE — Progress Notes (Signed)
Patient: Christine Richards. Female    DOB: [birth year]   73 y.o.   MRN: [id number] Visit Date: 05/21/2019  Today's Provider: Lavon Paganini, MD   Chief Complaint  Patient presents with  . Exposure to COVID-19   Subjective:    I, Christine Richards CMA, am acting as a scribe for Christine Paganini, MD.  Virtual Visit via Video Note  I connected with Christine Richards V. on 05/21/19 at 10:00 AM EDT by a video enabled telemedicine application and verified that I am speaking with the correct person using two identifiers.   Patient location: home Provider location: [place] involved in the visit: patient, provider   I discussed the limitations of evaluation and management by telemedicine and the availability of in person appointments. The patient expressed understanding and agreed to proceed.  HPI  Exposure to COVID-19 Patient presents via virtual visit for possible exposure to COVID-19. Patient states that on Friday, 05/18/2019 she went to pick up her granddaughter from daycare and the teacher went to the ER on Saturday, 05/19/2019 the teachers test results came back positive for COVID-19. Patient states she is not having any symptoms currently.  Had significant exposure to her granddaughter and was in the home with the daycare teacher.   Allergies  Allergen Reactions  . Antihistamines, Chlorpheniramine-Type     Facial swelling   . Macrobid [Nitrofurantoin Macrocrystal]     unknown  . Statins     Muscle Pain, Weakness      Current Outpatient Medications:  .  Blood Glucose Monitoring Suppl ([place]) w/Device KIT, New kit with device to check blood sugar once daily, Disp: 1 kit, Rfl: 0 .  Cholecalciferol (VITAMIN D) 2000 UNITS CAPS, Take 2,000 Units by mouth 2 (two) times daily. , Disp: , Rfl:  .  glucose blood (ONETOUCH VERIO) test strip, To check BS BID, Disp: 200 each, Rfl: 12 .  Lancets (ONETOUCH ULTRASOFT) lancets, To check blood sugar  BID, Disp: 200 each, Rfl: 12 .  Omega-3 Fatty Acids (FISH OIL) 1000 MG CAPS, Take 2,000 mg by mouth at bedtime. , Disp: , Rfl:  .  ALPRAZolam (XANAX) 0.5 MG tablet, Take 1 tablet (0.5 mg total) by mouth at bedtime as needed for anxiety. (Patient not taking: Reported on 03/07/2019), Disp: 10 tablet, Rfl: 0  Review of Systems  Constitutional: Negative.   HENT: Negative.   Respiratory: Negative.   Cardiovascular: Negative.   Gastrointestinal: Negative.   Neurological: Negative.     Social History   Tobacco Use  . Smoking status: Never Smoker  . Smokeless tobacco: Never Used  Substance Use Topics  . Alcohol use: No      Objective:   There were no vitals taken for this visit. There were no vitals filed for this visit.There is no height or weight on file to calculate BMI.   Physical Exam Constitutional:      General: She is not in acute distress.    Appearance: Normal appearance.  HENT:     Head: Normocephalic and atraumatic.  Eyes:     General: No scleral icterus.    Conjunctiva/sclera: Conjunctivae normal.  Pulmonary:     Effort: Pulmonary effort is normal. No respiratory distress.  Neurological:     Mental Status: She is alert and oriented to person, place, and time. Mental status is at baseline.  Psychiatric:        Mood and Affect: Mood normal.  Behavior: Behavior normal.      No results found for any visits on 05/21/19.     Assessment & Plan    I discussed the assessment and treatment plan with the patient. The patient was provided an opportunity to ask questions and all were answered. The patient agreed with the plan and demonstrated an understanding of the instructions.   The patient was advised to call back or seek an in-person evaluation if the symptoms worsen or if the condition fails to improve as anticipated.  1. Exposure to Covid-19 Virus - exposure to known positive and symptomatic individual on 05/18/2019 - patient current asymptomstic -  discussed that at anytime in the next 14 days, she could develop symptoms - patient declines any testing today, but will obtain OP testing if she develops any symptoms -Discussed need to quarantine for 14 days from date of exposure and then if she develops symptoms at least 10 days from start of symptoms -Discussed symptoms to watch for with COVID-19 as well as natural course and return precautions   Return if symptoms worsen or fail to improve.   The entirety of the information documented in the History of Present Illness, Review of Systems and Physical Exam were personally obtained by me. Portions of this information were initially documented by [facility], CMA and reviewed by me for thoroughness and accuracy.    Christine Richards, Dionne Bucy, MD MPH [facility]

## 2019-06-26 ENCOUNTER — Other Ambulatory Visit: Payer: Self-pay | Admitting: Physician Assistant

## 2019-06-26 ENCOUNTER — Other Ambulatory Visit: Payer: Self-pay

## 2019-06-26 ENCOUNTER — Ambulatory Visit (INDEPENDENT_AMBULATORY_CARE_PROVIDER_SITE_OTHER): Payer: Medicare HMO | Admitting: Physician Assistant

## 2019-06-26 ENCOUNTER — Encounter: Payer: Self-pay | Admitting: Physician Assistant

## 2019-06-26 VITALS — BP 122/70 | HR 63 | Temp 96.8°F | Resp 20 | Ht 65.0 in | Wt 170.6 lb

## 2019-06-26 DIAGNOSIS — E119 Type 2 diabetes mellitus without complications: Secondary | ICD-10-CM

## 2019-06-26 LAB — POCT GLYCOSYLATED HEMOGLOBIN (HGB A1C)
Est. average glucose Bld gHb Est-mCnc: 194
Hemoglobin A1C: 8.4 % — AB (ref 4.0–5.6)

## 2019-06-26 MED ORDER — METFORMIN HCL 500 MG PO TABS: 500.0000 mg | ORAL_TABLET | Freq: Every day | ORAL | 0 refills | Status: DC

## 2019-06-26 NOTE — Patient Instructions (Signed)
Metformin tablets What is this medicine? METFORMIN (met FOR min) is used to treat type 2 diabetes. It helps to control blood sugar. Treatment is combined with diet and exercise. This medicine can be used alone or with other medicines for diabetes. This medicine may be used for other purposes; ask your health care provider or pharmacist if you have questions. COMMON BRAND NAME(S): Glucophage What should I tell my health care provider before I take this medicine? They need to know if you have any of these conditions:  anemia  dehydration  heart disease  frequently drink alcohol-containing beverages  kidney disease  liver disease  polycystic ovary syndrome  serious infection or injury  vomiting  an unusual or allergic reaction to metformin, other medicines, foods, dyes, or preservatives  pregnant or trying to get pregnant  breast-feeding How should I use this medicine? Take this medicine by mouth with a glass of water. Follow the directions on the prescription label. Take this medicine with food. Take your medicine at regular intervals. Do not take your medicine more often than directed. Do not stop taking except on your doctor's advice. Talk to your pediatrician regarding the use of this medicine in children. While this drug may be prescribed for children as young as 10 years of age for selected conditions, precautions do apply. Overdosage: If you think you have taken too much of this medicine contact a poison control center or emergency room at once. NOTE: This medicine is only for you. Do not share this medicine with others. What if I miss a dose? If you miss a dose, take it as soon as you can. If it is almost time for your next dose, take only that dose. Do not take double or extra doses. What may interact with this medicine? Do not take this medicine with any of the following medications:  certain contrast medicines given before X-rays, CT scans, MRI, or other  procedures  dofetilide This medicine may also interact with the following medications:  acetazolamide  alcohol  certain antivirals for HIV or hepatitis  certain medicines for blood pressure, heart disease, irregular heart beat  cimetidine  dichlorphenamide  digoxin  diuretics  female hormones, like estrogens or progestins and birth control pills  glycopyrrolate  isoniazid  lamotrigine  memantine  methazolamide  metoclopramide  midodrine  niacin  phenothiazines like chlorpromazine, mesoridazine, prochlorperazine, thioridazine  phenytoin  ranolazine  steroid medicines like prednisone or cortisone  stimulant medicines for attention disorders, weight loss, or to stay awake  thyroid medicines  topiramate  trospium  vandetanib  zonisamide This list may not describe all possible interactions. Give your health care provider a list of all the medicines, herbs, non-prescription drugs, or dietary supplements you use. Also tell them if you smoke, drink alcohol, or use illegal drugs. Some items may interact with your medicine. What should I watch for while using this medicine? Visit your doctor or health care professional for regular checks on your progress. A test called the HbA1C (A1C) will be monitored. This is a simple blood test. It measures your blood sugar control over the last 2 to 3 months. You will receive this test every 3 to 6 months. Learn how to check your blood sugar. Learn the symptoms of low and high blood sugar and how to manage them. Always carry a quick-source of sugar with you in case you have symptoms of low blood sugar. Examples include hard sugar candy or glucose tablets. Make sure others know that you can choke   if you eat or drink when you develop serious symptoms of low blood sugar, such as seizures or unconsciousness. They must get medical help at once. Tell your doctor or health care professional if you have high blood sugar. You might  need to change the dose of your medicine. If you are sick or exercising more than usual, you might need to change the dose of your medicine. Do not skip meals. Ask your doctor or health care professional if you should avoid alcohol. Many nonprescription cough and cold products contain sugar or alcohol. These can affect blood sugar. This medicine may cause ovulation in premenopausal women who do not have regular monthly periods. This may increase your chances of becoming pregnant. You should not take this medicine if you become pregnant or think you may be pregnant. Talk with your doctor or health care professional about your birth control options while taking this medicine. Contact your doctor or health care professional right away if you think you are pregnant. If you are going to need surgery, a MRI, CT scan, or other procedure, tell your doctor that you are taking this medicine. You may need to stop taking this medicine before the procedure. Wear a medical ID bracelet or chain, and carry a card that describes your disease and details of your medicine and dosage times. This medicine may cause a decrease in folic acid and vitamin B12. You should make sure that you get enough vitamins while you are taking this medicine. Discuss the foods you eat and the vitamins you take with your health care professional. What side effects may I notice from receiving this medicine? Side effects that you should report to your doctor or health care professional as soon as possible:  allergic reactions like skin rash, itching or hives, swelling of the face, lips, or tongue  breathing problems  feeling faint or lightheaded, falls  muscle aches or pains  signs and symptoms of low blood sugar such as feeling anxious, confusion, dizziness, increased hunger, unusually weak or tired, sweating, shakiness, cold, irritable, headache, blurred vision, fast heartbeat, loss of consciousness  slow or irregular  heartbeat  unusual stomach pain or discomfort  unusually tired or weak Side effects that usually do not require medical attention (report to your doctor or health care professional if they continue or are bothersome):  diarrhea  headache  heartburn  metallic taste in mouth  nausea  stomach gas, upset This list may not describe all possible side effects. Call your doctor for medical advice about side effects. You may report side effects to FDA at 1-800-FDA-1088. Where should I keep my medicine? Keep out of the reach of children. Store at room temperature between 15 and 30 degrees C (59 and 86 degrees F). Protect from moisture and light. Throw away any unused medicine after the expiration date. NOTE: This sheet is a summary. It may not cover all possible information. If you have questions about this medicine, talk to your doctor, pharmacist, or health care provider.  2020 Elsevier/Gold Standard (2017-09-22 19:15:19)  

## 2019-06-26 NOTE — Progress Notes (Addendum)
Patient: Christine Richards. Female    DOB: 1946-05-28   73 y.o.   MRN: [id number] Visit Date: 06/26/2019  Today's Provider: Mar Daring, PA-C   Chief Complaint  Patient presents with  . Follow up for A1C   Subjective:    I,Joseline E. Rosas,RMA am acting as a Education administrator for [employer], PA-C.  HPI   Diabetes Mellitus Type II, Follow-up:   Lab Results  Component Value Date   HGBA1C 8.4 (A) 06/26/2019   HGBA1C 8.1 (H) 03/08/2019   HGBA1C 7.9 (A) 07/07/2018    Last seen for diabetes 3 months ago.  Management since then includes none.  Diet controlled. Patient has declined meds in the pas Current symptoms include none and have been stable. Current Glucose has been running 189 fasting.  Most Recent Eye Exam: 01/2019 Weight trend: stable Current exercise: bicycling, works out in [employer]. Current diet habits: in general, a "healthy" diet    Pertinent Labs:    Component Value Date/Time   CHOL 383 (H) 03/08/2019 0847   TRIG 186 (H) 03/08/2019 0847   HDL 61 03/08/2019 0847   LDLCALC 285 (H) 03/08/2019 0847   CREATININE 0.78 03/08/2019 0847   CREATININE 0.72 03/17/2013 1443    Wt Readings from Last 3 Encounters:  06/26/19 170 lb 9.6 oz (77.4 kg)  03/07/19 165 lb 3.2 oz (74.9 kg)  01/09/19 163 lb 12.8 oz (74.3 kg)   ------------------------------------------------------------------------    Allergies  Allergen Reactions  . Antihistamines, Chlorpheniramine-Type     Facial swelling   . Macrobid [Nitrofurantoin Macrocrystal]     unknown  . Statins     Muscle Pain, Weakness      Current Outpatient Medications:  .  tacrolimus (PROTOPIC) 0.1 % ointment, Apply topically., Disp: , Rfl:  .  Blood Glucose Monitoring Suppl ([place]) w/Device KIT, New kit with device to check blood sugar once daily, Disp: 1 kit, Rfl: 0 .  Cholecalciferol (VITAMIN D) 2000 UNITS CAPS, Take 2,000 Units by mouth 2 (two) times daily. , Disp: , Rfl:  .  glucose  blood (ONETOUCH VERIO) test strip, To check BS BID, Disp: 200 each, Rfl: 12 .  Lancets (ONETOUCH ULTRASOFT) lancets, To check blood sugar BID, Disp: 200 each, Rfl: 12 .  metFORMIN (GLUCOPHAGE) 500 MG tablet, TAKE 1 TABLET(500 MG) BY MOUTH DAILY WITH BREAKFAST, Disp: 90 tablet, Rfl: 0 .  Omega-3 Fatty Acids (FISH OIL) 1000 MG CAPS, Take 2,000 mg by mouth at bedtime. , Disp: , Rfl:   Review of Systems  Constitutional: Negative.   Respiratory: Negative.   Cardiovascular: Negative.   Endocrine: Negative for polydipsia, polyphagia and polyuria.  Neurological: Negative.     Social History   Tobacco Use  . Smoking status: Never Smoker  . Smokeless tobacco: Never Used  Substance Use Topics  . Alcohol use: No      Objective:   BP 122/70   Pulse 63   Temp (!) 96.8 F (36 C) (Temporal)   Resp 20   Ht _0  (1.651 m)   Wt 170 lb 9.6 oz (77.4 kg)   SpO2 98%   BMI 28.39 kg/m  Vitals:   06/26/19 1328  BP: 122/70  Pulse: 63  Resp: 20  Temp: (!) 96.8 F (36 C)  TempSrc: Temporal  SpO2: 98%  Weight: 170 lb 9.6 oz (77.4 kg)  Height: _1  (1.651 m)  Body mass index is 28.39 kg/m.   Physical  Exam Vitals signs reviewed.  Constitutional:      General: She is not in acute distress.    Appearance: Normal appearance. She is well-developed and normal weight. She is not ill-appearing or diaphoretic.  Neck:     Musculoskeletal: Normal range of motion and neck supple.  Cardiovascular:     Rate and Rhythm: Normal rate and regular rhythm.     Heart sounds: Normal heart sounds. No murmur. No friction rub. No gallop.   Pulmonary:     Effort: Pulmonary effort is normal. No respiratory distress.     Breath sounds: Normal breath sounds. No wheezing or rales.  Neurological:     Mental Status: She is alert.      Results for orders placed or performed in visit on 06/26/19  POCT glycosylated hemoglobin (Hb A1C)  Result Value Ref Range   Hemoglobin A1C 8.4 (A) 4.0 - 5.6 %   Est.  average glucose Bld gHb Est-mCnc 194        Assessment & Plan    1. Controlled type 2 diabetes mellitus without complication, without long-term current use of insulin ([place]) Since blood sugars have continued to elevate despite all lifestyle modifications, she is agreeable to start Metformin. We will start at 522m once daily to see how well she tolerates this medication (has sensitivities). She is to call/send mEstée Lauderif not tolerating well and will change and try to get coverage for one of the SGLT-2 inhibitor medications. Return in 3 months for f/u.  - metFORMIN (GLUCOPHAGE) 500 MG tablet; Take 1 tablet (500 mg total) by mouth daily with breakfast.  Dispense: 30 tablet; Refill: 0     JMar Daring PA-C  [facility]

## 2019-07-12 ENCOUNTER — Encounter: Payer: Self-pay | Admitting: Physician Assistant

## 2019-07-13 ENCOUNTER — Ambulatory Visit (INDEPENDENT_AMBULATORY_CARE_PROVIDER_SITE_OTHER): Payer: Medicare HMO | Admitting: Vascular Surgery

## 2019-07-13 ENCOUNTER — Ambulatory Visit (INDEPENDENT_AMBULATORY_CARE_PROVIDER_SITE_OTHER): Payer: Medicare HMO

## 2019-07-13 ENCOUNTER — Other Ambulatory Visit: Payer: Self-pay

## 2019-07-13 ENCOUNTER — Encounter (INDEPENDENT_AMBULATORY_CARE_PROVIDER_SITE_OTHER): Payer: Self-pay

## 2019-07-13 DIAGNOSIS — I6523 Occlusion and stenosis of bilateral carotid arteries: Secondary | ICD-10-CM

## 2019-07-13 DIAGNOSIS — E7800 Pure hypercholesterolemia, unspecified: Secondary | ICD-10-CM

## 2019-07-13 DIAGNOSIS — E119 Type 2 diabetes mellitus without complications: Secondary | ICD-10-CM

## 2019-07-13 NOTE — Progress Notes (Signed)
Patient left before being seen.  [place] stable.  Should recheck in 6 months.

## 2019-07-16 ENCOUNTER — Other Ambulatory Visit (INDEPENDENT_AMBULATORY_CARE_PROVIDER_SITE_OTHER): Payer: Self-pay | Admitting: Vascular Surgery

## 2019-07-16 DIAGNOSIS — I6523 Occlusion and stenosis of bilateral carotid arteries: Secondary | ICD-10-CM

## 2019-07-17 ENCOUNTER — Encounter (INDEPENDENT_AMBULATORY_CARE_PROVIDER_SITE_OTHER): Payer: Self-pay | Admitting: Vascular Surgery

## 2019-08-11 ENCOUNTER — Encounter: Payer: Self-pay | Admitting: Physician Assistant

## 2019-08-11 ENCOUNTER — Other Ambulatory Visit: Payer: Self-pay | Admitting: Physician Assistant

## 2019-08-11 DIAGNOSIS — E119 Type 2 diabetes mellitus without complications: Secondary | ICD-10-CM

## 2019-08-12 DIAGNOSIS — R69 Illness, unspecified: Secondary | ICD-10-CM | POA: Diagnosis not present

## 2019-08-13 MED ORDER — METFORMIN HCL 500 MG PO TABS: ORAL_TABLET | ORAL | 0 refills | Status: DC

## 2019-08-17 ENCOUNTER — Encounter: Payer: Self-pay | Admitting: Physician Assistant

## 2019-08-17 NOTE — Telephone Encounter (Signed)
Josie,  Did you get a chance to call [facility] yesterday for them to refill her metformin early due to spilling on the floor?  If not, can we do that for her and let her know once called.

## 2019-08-27 ENCOUNTER — Encounter: Payer: Self-pay | Admitting: Physician Assistant

## 2019-08-27 DIAGNOSIS — H2512 Age-related nuclear cataract, left eye: Secondary | ICD-10-CM | POA: Diagnosis not present

## 2019-08-27 DIAGNOSIS — H353131 Nonexudative age-related macular degeneration, bilateral, early dry stage: Secondary | ICD-10-CM | POA: Diagnosis not present

## 2019-08-27 LAB — HM DIABETES EYE EXAM

## 2019-09-03 ENCOUNTER — Encounter: Payer: Self-pay | Admitting: Physician Assistant

## 2019-09-07 ENCOUNTER — Ambulatory Visit: Payer: Self-pay | Admitting: Physician Assistant

## 2019-09-13 DIAGNOSIS — G4733 Obstructive sleep apnea (adult) (pediatric): Secondary | ICD-10-CM | POA: Diagnosis not present

## 2019-09-13 DIAGNOSIS — H2512 Age-related nuclear cataract, left eye: Secondary | ICD-10-CM | POA: Diagnosis not present

## 2019-09-17 ENCOUNTER — Encounter: Payer: Self-pay | Admitting: Ophthalmology

## 2019-09-24 ENCOUNTER — Other Ambulatory Visit
Admission: RE | Admit: 2019-09-24 | Discharge: 2019-09-24 | Disposition: A | Discharge status: Home / Self Care | Payer: Medicare HMO | Source: Ambulatory Visit | Attending: Ophthalmology | Admitting: Ophthalmology

## 2019-09-24 ENCOUNTER — Other Ambulatory Visit: Payer: Self-pay

## 2019-09-24 DIAGNOSIS — Z01812 Encounter for preprocedural laboratory examination: Secondary | ICD-10-CM | POA: Insufficient documentation

## 2019-09-24 DIAGNOSIS — Z20822 Contact with and (suspected) exposure to covid-19: Secondary | ICD-10-CM | POA: Diagnosis not present

## 2019-09-24 NOTE — Discharge Instructions (Signed)

## 2019-09-25 LAB — SARS CORONAVIRUS 2 (TAT 6-24 HRS): SARS Coronavirus 2: NEGATIVE

## 2019-09-26 ENCOUNTER — Encounter
Admission: RE | Disposition: A | Discharge status: Home / Self Care | Payer: Self-pay | Source: Home / Self Care | Attending: Ophthalmology

## 2019-09-26 ENCOUNTER — Ambulatory Visit
Admission: RE | Admit: 2019-09-26 | Discharge: 2019-09-26 | Disposition: A | Discharge status: Home / Self Care | Payer: Medicare HMO | Attending: Ophthalmology | Admitting: Ophthalmology

## 2019-09-26 ENCOUNTER — Ambulatory Visit: Payer: Medicare HMO | Admitting: Anesthesiology

## 2019-09-26 ENCOUNTER — Other Ambulatory Visit: Payer: Self-pay

## 2019-09-26 ENCOUNTER — Encounter: Payer: Self-pay | Admitting: Ophthalmology

## 2019-09-26 DIAGNOSIS — M069 Rheumatoid arthritis, unspecified: Secondary | ICD-10-CM | POA: Diagnosis not present

## 2019-09-26 DIAGNOSIS — G4730 Sleep apnea, unspecified: Secondary | ICD-10-CM | POA: Diagnosis not present

## 2019-09-26 DIAGNOSIS — R69 Illness, unspecified: Secondary | ICD-10-CM | POA: Diagnosis not present

## 2019-09-26 DIAGNOSIS — F419 Anxiety disorder, unspecified: Secondary | ICD-10-CM | POA: Insufficient documentation

## 2019-09-26 DIAGNOSIS — Z888 Allergy status to other drugs, medicaments and biological substances status: Secondary | ICD-10-CM | POA: Diagnosis not present

## 2019-09-26 DIAGNOSIS — R519 Headache, unspecified: Secondary | ICD-10-CM | POA: Insufficient documentation

## 2019-09-26 DIAGNOSIS — M8580 Other specified disorders of bone density and structure, unspecified site: Secondary | ICD-10-CM | POA: Insufficient documentation

## 2019-09-26 DIAGNOSIS — K219 Gastro-esophageal reflux disease without esophagitis: Secondary | ICD-10-CM | POA: Insufficient documentation

## 2019-09-26 DIAGNOSIS — E7800 Pure hypercholesterolemia, unspecified: Secondary | ICD-10-CM | POA: Diagnosis not present

## 2019-09-26 DIAGNOSIS — G709 Myoneural disorder, unspecified: Secondary | ICD-10-CM | POA: Insufficient documentation

## 2019-09-26 DIAGNOSIS — Z881 Allergy status to other antibiotic agents status: Secondary | ICD-10-CM | POA: Insufficient documentation

## 2019-09-26 DIAGNOSIS — E1136 Type 2 diabetes mellitus with diabetic cataract: Secondary | ICD-10-CM | POA: Diagnosis not present

## 2019-09-26 DIAGNOSIS — H25812 Combined forms of age-related cataract, left eye: Secondary | ICD-10-CM | POA: Diagnosis not present

## 2019-09-26 DIAGNOSIS — H2512 Age-related nuclear cataract, left eye: Secondary | ICD-10-CM | POA: Insufficient documentation

## 2019-09-26 DIAGNOSIS — Z8673 Personal history of transient ischemic attack (TIA), and cerebral infarction without residual deficits: Secondary | ICD-10-CM | POA: Insufficient documentation

## 2019-09-26 DIAGNOSIS — M1990 Unspecified osteoarthritis, unspecified site: Secondary | ICD-10-CM | POA: Diagnosis not present

## 2019-09-26 DIAGNOSIS — Z9849 Cataract extraction status, unspecified eye: Secondary | ICD-10-CM | POA: Diagnosis not present

## 2019-09-26 DIAGNOSIS — E785 Hyperlipidemia, unspecified: Secondary | ICD-10-CM | POA: Insufficient documentation

## 2019-09-26 HISTORY — PX: CATARACT EXTRACTION W/PHACO: SHX586

## 2019-09-26 LAB — GLUCOSE, CAPILLARY
Glucose-Capillary: 197 mg/dL — ABNORMAL HIGH (ref 70–99)
Glucose-Capillary: 179 mg/dL — ABNORMAL HIGH (ref 70–99)

## 2019-09-26 SURGERY — PHACOEMULSIFICATION, CATARACT, WITH IOL INSERTION
Anesthesia: Monitor Anesthesia Care | Site: Eye | Laterality: Left

## 2019-09-26 MED ORDER — LACTATED RINGERS IV SOLN: 100.0000 mL/h | INTRAVENOUS | Status: DC

## 2019-09-26 MED ORDER — NA HYALUR & NA CHOND-NA HYALUR 0.4-0.35 ML IO KIT
PACK | INTRAOCULAR | Status: DC | PRN
  Administered 2019-09-26: 1 mL via INTRAOCULAR

## 2019-09-26 MED ORDER — MOXIFLOXACIN HCL 0.5 % OP SOLN
1.0000 [drp] | OPHTHALMIC | Status: DC | PRN
  Administered 2019-09-26 (×3): 1 [drp] via OPHTHALMIC

## 2019-09-26 MED ORDER — MIDAZOLAM HCL 2 MG/2ML IJ SOLN
INTRAMUSCULAR | Status: DC | PRN
  Administered 2019-09-26: 1 mg via INTRAVENOUS

## 2019-09-26 MED ORDER — FENTANYL CITRATE (PF) 100 MCG/2ML IJ SOLN
INTRAMUSCULAR | Status: DC | PRN
  Administered 2019-09-26: 50 ug via INTRAVENOUS

## 2019-09-26 MED ORDER — ACETAMINOPHEN 160 MG/5ML PO SOLN: 325.0000 mg | ORAL | Status: DC | PRN

## 2019-09-26 MED ORDER — ACETAMINOPHEN 325 MG PO TABS: 325.0000 mg | ORAL_TABLET | ORAL | Status: DC | PRN

## 2019-09-26 MED ORDER — ARMC OPHTHALMIC DILATING DROPS
1.0000 "application " | OPHTHALMIC | Status: DC | PRN
  Administered 2019-09-26 (×3): 1 via OPHTHALMIC

## 2019-09-26 MED ORDER — EPINEPHRINE PF 1 MG/ML IJ SOLN
INTRAOCULAR | Status: DC | PRN
  Administered 2019-09-26: 11:00:00 65 mL via OPHTHALMIC

## 2019-09-26 MED ORDER — BRIMONIDINE TARTRATE-TIMOLOL 0.2-0.5 % OP SOLN
OPHTHALMIC | Status: DC | PRN
  Administered 2019-09-26: 1 [drp] via OPHTHALMIC

## 2019-09-26 MED ORDER — TETRACAINE HCL 0.5 % OP SOLN
1.0000 [drp] | OPHTHALMIC | Status: DC | PRN
  Administered 2019-09-26 (×3): 1 [drp] via OPHTHALMIC

## 2019-09-26 MED ORDER — CEFUROXIME OPHTHALMIC INJECTION 1 MG/0.1 ML
INJECTION | OPHTHALMIC | Status: DC | PRN
  Administered 2019-09-26: 0.1 mL via INTRACAMERAL

## 2019-09-26 MED ORDER — LIDOCAINE HCL (PF) 2 % IJ SOLN
INTRAOCULAR | Status: DC | PRN
  Administered 2019-09-26: 11:00:00 2 mL

## 2019-09-26 MED ORDER — ONDANSETRON HCL 4 MG/2ML IJ SOLN: 4.0000 mg | Freq: Once | INTRAMUSCULAR | Status: DC | PRN

## 2019-09-26 MED ORDER — ERYTHROMYCIN 5 MG/GM OP OINT
TOPICAL_OINTMENT | OPHTHALMIC | Status: DC | PRN
  Administered 2019-09-26: 1 via OPHTHALMIC

## 2019-09-26 SURGICAL SUPPLY — 20 items
CANNULA ANT/CHMB 27G (MISCELLANEOUS) ×1 IMPLANT
CANNULA ANT/CHMB 27GA (MISCELLANEOUS) ×2 IMPLANT
CARTRIDGE ABBOTT (MISCELLANEOUS) ×1 IMPLANT
GLOVE SURG LX 7.5 STRW (GLOVE) ×1
GLOVE SURG LX STRL 7.5 STRW (GLOVE) ×1 IMPLANT
GLOVE SURG TRIUMPH 8.0 PF LTX (GLOVE) ×2 IMPLANT
GOWN STRL REUS W/ TWL LRG LVL3 (GOWN DISPOSABLE) ×2 IMPLANT
GOWN STRL REUS W/TWL LRG LVL3 (GOWN DISPOSABLE) ×4
LENS IOL TECNIS SYMFONY 21.5 ×1 IMPLANT
MARKER SKIN DUAL TIP RULER LAB (MISCELLANEOUS) ×2 IMPLANT
NDL FILTER BLUNT 18X1 1/2 (NEEDLE) IMPLANT
NEEDLE FILTER BLUNT 18X 1/2SAF (NEEDLE) ×1
NEEDLE FILTER BLUNT 18X1 1/2 (NEEDLE) ×1 IMPLANT
PACK CATARACT BRASINGTON (MISCELLANEOUS) ×2 IMPLANT
PACK EYE AFTER SURG (MISCELLANEOUS) ×2 IMPLANT
PACK OPTHALMIC (MISCELLANEOUS) ×2 IMPLANT
SYR 3ML LL SCALE MARK (SYRINGE) ×3 IMPLANT
SYR TB 1ML LUER SLIP (SYRINGE) ×2 IMPLANT
WATER STERILE IRR 250ML POUR (IV SOLUTION) ×1 IMPLANT
WIPE NON LINTING 3.25X3.25 (MISCELLANEOUS) ×2 IMPLANT

## 2019-09-26 NOTE — Op Note (Signed)
OPERATIVE NOTE  Christine Richards [id number] 09/26/2019   PREOPERATIVE DIAGNOSIS:  Nuclear sclerotic cataract left eye. H25.12   POSTOPERATIVE DIAGNOSIS:    Nuclear sclerotic cataract left eye.     PROCEDURE:  Phacoemusification with posterior chamber intraocular lens placement of the left eye  Ultrasound time: Procedure(s): CATARACT EXTRACTION PHACO AND INTRAOCULAR LENS PLACEMENT (IOC) LEFT DIABETIC SYMFONY LENS 12.82 01:22.2 15.6% (Left)  LENS:   Implant Name Type Inv. Item Serial No. Manufacturer Lot No. LRB No. Used Action  LENS IOL SYMFONY 21.5 - [id number]  LENS IOL SYMFONY 21.5 [id number]  Left 1 Implanted      SURGEON:  Wyonia Hough, MD   ANESTHESIA:  Topical with tetracaine drops and 2% Xylocaine jelly, augmented with 1% preservative-free intracameral lidocaine.    COMPLICATIONS:  None.   DESCRIPTION OF PROCEDURE:  The patient was identified in the holding room and transported to the operating room and placed in the supine position under the operating microscope.  The left eye was identified as the operative eye and it was prepped and draped in the usual sterile ophthalmic fashion.   A 1 millimeter clear-corneal paracentesis was made at the 1:30 position.  0.5 ml of preservative-free 1% lidocaine was injected into the anterior chamber.  The anterior chamber was filled with Viscoat viscoelastic.  A 2.4 millimeter keratome was used to make a near-clear corneal incision at the 10:30 position.  .  A curvilinear capsulorrhexis was made with a cystotome and capsulorrhexis forceps.  Balanced salt solution was used to hydrodissect and hydrodelineate the nucleus.   Phacoemulsification was then used in stop and chop fashion to remove the lens nucleus and epinucleus.  The remaining cortex was then removed using the irrigation and aspiration handpiece. Provisc was then placed into the capsular bag to distend it for lens placement.  A lens was then injected into the capsular  bag.  The remaining viscoelastic was aspirated.   Wounds were hydrated with balanced salt solution.  The anterior chamber was inflated to a physiologic pressure with balanced salt solution.  No wound leaks were noted. Cefuroxime 0.1 ml of a 10mg /ml solution was injected into the anterior chamber for a dose of 1 mg of intracameral antibiotic at the completion of the case.   Timolol and Brimonidine drops were applied to the eye.  The patient was taken to the recovery room in stable condition without complications of anesthesia or surgery.  Ruble Pumphrey 09/26/2019, 11:02 AM

## 2019-09-26 NOTE — Anesthesia Preprocedure Evaluation (Signed)
Anesthesia Evaluation  Patient identified by MRN, date of birth, ID band Patient awake    Reviewed: Allergy & Precautions, NPO status , Patient's Chart, lab work & pertinent test results  History of Anesthesia Complications Negative for: history of anesthetic complications  Airway Mallampati: III  TM Distance: >3 FB Neck ROM: Full    Dental no notable dental hx.    Pulmonary sleep apnea and Continuous Positive Airway Pressure Ventilation , neg COPD,    breath sounds clear to auscultation- rhonchi (-) wheezing      Cardiovascular Exercise Tolerance: Good (-) hypertension(-) CAD and (-) Past MI  Rhythm:Regular Rate:Normal - Systolic murmurs and - Diastolic murmurs    Neuro/Psych  Headaches, PSYCHIATRIC DISORDERS Anxiety TIA Neuromuscular disease    GI/Hepatic Neg liver ROS, GERD  ,  Endo/Other  diabetesHypothyroidism   Renal/GU negative Renal ROS     Musculoskeletal  (+) Arthritis ,   Abdominal (+) - obese,   Peds  Hematology negative hematology ROS (+)   Anesthesia Other Findings Past Medical History: No date: Arthritis     Comment: rheumatoid No date: Diverticulosis No date: Environmental and seasonal allergies No date: GERD (gastroesophageal reflux disease) No date: Hyperlipidemia No date: Osteopenia No date: Sinus congestion No date: Sleep apnea     Comment: uses CPAP No date: Stroke [facility])     Comment: TIA 6 years ago no issues since No date: TIA (transient ischemic attack)   Reproductive/Obstetrics                             Anesthesia Physical  Anesthesia Plan  ASA: III  Anesthesia Plan: MAC   Post-op Pain Management:    Induction: Intravenous  PONV Risk Score and Plan: 2 and Midazolam and Treatment may vary due to age or medical condition  Airway Management Planned: Nasal Cannula  Additional Equipment:   Intra-op Plan:   Post-operative Plan:   Informed  Consent: I have reviewed the patients History and Physical, chart, labs and discussed the procedure including the risks, benefits and alternatives for the proposed anesthesia with the patient or authorized representative who has indicated his/her understanding and acceptance.       Plan Discussed with: CRNA and Anesthesiologist  Anesthesia Plan Comments:         Anesthesia Quick Evaluation  Patient Active Problem List   Diagnosis Date Noted  . Cystocele with prolapse 03/07/2019  . Heartburn   . Gastritis   . Situational anxiety 11/20/2015  . Arthritis 02/11/2015  . Allergic rhinitis 12/25/2014  . Aneurysm ([place]) 12/25/2014  . Carotid stenosis 12/25/2014  . Controlled diabetes mellitus type II without complication ([place]) 88/50/2774  . Aerophobia 12/25/2014  . Fibrositis 12/25/2014  . Headache, migraine 12/25/2014  . Osteopenia 12/25/2014  . Avitaminosis D 12/25/2014  . 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 Personal history of TIA (transient ischemic attack) 09/01/2009  . 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 Acquired hypothyroidism 04/25/2009  . Hypercholesteremia 02/20/2007  . 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 Apnea, sleep 12/02/2006    CBC Latest Ref Rng & Units 03/08/2019 11/04/2018 12/20/2017  WBC 3.4 - 10.8 x10E3/uL 5.2 4.9 5.5  Hemoglobin 11.1 - 15.9 g/dL 14.3 14.3 14.5  Hematocrit 34.0 - 46.6 % 42.6 44.7 43.3  Platelets 150 - 450 x10E3/uL 234 219 239   BMP Latest Ref Rng & Units 03/08/2019 11/04/2018 12/20/2017  Glucose 65 - 99 mg/dL 190(H) 189(H) 183(H)  BUN 8 - 27 mg/dL _0 Creatinine 0.57 - 1.00 mg/dL 0.78 0.60 0.71  BUN/Creat Ratio 12 - 28 27 - 25  Sodium 134 - 144 mmol/L 136 138 138  Potassium 3.5 - 5.2 mmol/L 3.9 3.8 4.0  Chloride 96 - 106 mmol/L 97 104 99  CO2 20 - 29 mmol/L _1 Calcium 8.7 - 10.3 mg/dL 9.7 9.4 9.6    Risks and benefits of anesthesia discussed at length, patient or surrogate demonstrates  understanding. Appropriately NPO. Plan to proceed with anesthesia.  Champ Mungo, MD 09/26/19

## 2019-09-26 NOTE — H&P (Signed)

## 2019-09-26 NOTE — Anesthesia Postprocedure Evaluation (Signed)
Anesthesia Post Note  Patient: Christine Richards.  Procedure(s) Performed: CATARACT EXTRACTION PHACO AND INTRAOCULAR LENS PLACEMENT ([facility]) LEFT DIABETIC SYMFONY LENS 12.82 01:22.2 15.6% (Left Eye)     Patient location during evaluation: Phase II Anesthesia Type: MAC Level of consciousness: oriented and awake and alert Pain management: pain level controlled Vital Signs Assessment: vitals unstable and post-procedure vital signs reviewed and stable Respiratory status: spontaneous breathing and respiratory function stable Cardiovascular status: blood pressure returned to baseline and stable Postop Assessment: no headache and no backache Anesthetic complications: no    Sinda Du

## 2019-09-26 NOTE — Transfer of Care (Signed)
Immediate Anesthesia Transfer of Care Note  Patient: Christine Richards.  Procedure(s) Performed: CATARACT EXTRACTION PHACO AND INTRAOCULAR LENS PLACEMENT (IOC) LEFT DIABETIC SYMFONY LENS 12.82 01:22.2 15.6% (Left Eye)  Patient Location: PACU  Anesthesia Type: MAC  Level of Consciousness: awake, alert  and patient cooperative  Airway and Oxygen Therapy: Patient Spontanous Breathing and Patient connected to supplemental oxygen  Post-op Assessment: Post-op Vital signs reviewed, Patient's Cardiovascular Status Stable, Respiratory Function Stable, Patent Airway and No signs of Nausea or vomiting  Post-op Vital Signs: Reviewed and stable  Complications: No apparent anesthesia complications

## 2019-09-27 ENCOUNTER — Encounter: Payer: Self-pay | Admitting: *Deleted

## 2019-10-01 ENCOUNTER — Ambulatory Visit: Payer: Medicare HMO | Admitting: Physician Assistant

## 2019-10-01 NOTE — Progress Notes (Signed)
Patient: Christine Richards. Female    DOB: Feb 09, 1946   74 y.o.   MRN: [id number] Visit Date: 10/04/2019  Today's Provider: Mar Daring, PA-C   Chief Complaint  Patient presents with  . Diabetes   Subjective:     HPI   Diabetes Mellitus Type II, Follow-up:   Lab Results  Component Value Date   HGBA1C 8.4 (A) 10/04/2019   HGBA1C 8.4 (A) 06/26/2019   HGBA1C 8.1 (H) 03/08/2019    Last seen for diabetes 3 months ago.  Management since then includes start at 555m once daily. She reports excellent compliance with treatment. She is not having side effects.  Current symptoms include none and have been stable. Home blood sugar records: fasting range: 190-200  Episodes of hypoglycemia? no   Current insulin regiment: Is not on insulin Most Recent Eye Exam: UTD Weight trend: stable Prior visit with dietician: No Current exercise: walking Current diet habits: in general, a "healthy" diet    Pertinent Labs:    Component Value Date/Time   CHOL 383 (H) 03/08/2019 0847   TRIG 186 (H) 03/08/2019 0847   HDL 61 03/08/2019 0847   LDLCALC 285 (H) 03/08/2019 0847   CREATININE 0.78 03/08/2019 0847   CREATININE 0.72 03/17/2013 1443    Wt Readings from Last 3 Encounters:  10/04/19 170 lb (77.1 kg)  09/26/19 173 lb (78.5 kg)  06/26/19 170 lb 9.6 oz (77.4 kg)    ------------------------------------------------------------------------   Allergies  Allergen Reactions  . Antihistamines, Chlorpheniramine-Type     Facial swelling   . Macrobid [Nitrofurantoin Macrocrystal]     unknown  . Statins     Muscle Pain, Weakness      Current Outpatient Medications:  .  Blood Glucose Monitoring Suppl ([place] w/Device KIT, New kit with device to check blood sugar once daily, Disp: 1 kit, Rfl: 0 .  Cholecalciferol (VITAMIN D) 2000 UNITS CAPS, Take 2,000 Units by mouth 2 (two) times daily. , Disp: , Rfl:  .  Lancets (ONETOUCH DELICA PLUS [id number] MISC,  CHECK BLOOD SUGAR TWICE DAILY AS DIRECTED, Disp: 100 each, Rfl: 1 .  metFORMIN (GLUCOPHAGE) 500 MG tablet, TAKE 1 TABLET(500 MG) BY MOUTH DAILY WITH BREAKFAST, Disp: 90 tablet, Rfl: 0 .  Omega-3 Fatty Acids (FISH OIL) 1000 MG CAPS, Take 2,000 mg by mouth at bedtime. , Disp: , Rfl:  .  ONETOUCH VERIO test strip, CHECK BLOOD SUGAR TWICE DAILY, Disp: 200 strip, Rfl: 1 .  tacrolimus (PROTOPIC) 0.1 % ointment, Apply topically., Disp: , Rfl:   Review of Systems  Constitutional: Negative.   Respiratory: Negative.   Cardiovascular: Negative.   Endocrine: Negative.   Neurological: Negative.     Social History   Tobacco Use  . Smoking status: Never Smoker  . Smokeless tobacco: Never Used  Substance Use Topics  . Alcohol use: No      Objective:   BP 113/70 (BP Location: Left Arm, Patient Position: Sitting, Cuff Size: Large)   Pulse 61   Temp (!) 96.9 F (36.1 C) (Temporal)   Resp 16   Wt 170 lb (77.1 kg)   BMI 28.29 kg/m  Vitals:   10/04/19 1009  BP: 113/70  Pulse: 61  Resp: 16  Temp: (!) 96.9 F (36.1 C)  TempSrc: Temporal  Weight: 170 lb (77.1 kg)  Body mass index is 28.29 kg/m.   Physical Exam Vitals reviewed.  Constitutional:      General: She is not  in acute distress.    Appearance: Normal appearance. She is well-developed. She is not ill-appearing.  HENT:     Head: Normocephalic and atraumatic.  Pulmonary:     Effort: Pulmonary effort is normal. No respiratory distress.  Musculoskeletal:     Cervical back: Normal range of motion and neck supple.  Neurological:     Mental Status: She is alert.  Psychiatric:        Mood and Affect: Mood normal.        Behavior: Behavior normal.        Thought Content: Thought content normal.        Judgment: Judgment normal.      Results for orders placed or performed in visit on 10/04/19  POCT glycosylated hemoglobin (Hb A1C)  Result Value Ref Range   Hemoglobin A1C 8.4 (A) 4.0 - 5.6 %   Est. average glucose Bld gHb  Est-mCnc 194        Assessment & Plan    1. Controlled type 2 diabetes mellitus without complication, without long-term current use of insulin (HCC) A1c stable at 8.4 despite starting metformin 544m daily. Will increase to Metfofrmin 10066mdaily (she is going to try to take all at once and see if GI side effects occur, if so may do 5003mID instead). Continue lifestyle modifications with healthy dieting and exercise. Return in 3 months.  - metFORMIN (GLUCOPHAGE) 500 MG tablet; Take 2 tablets (1,000 mg total) by mouth daily with breakfast. TAKE 1 TABLET(500 MG) BY MOUTH DAILY WITH BREAKFAST  Dispense: 180 tablet; Refill: 0     JenMar DaringA-C  [place]

## 2019-10-04 ENCOUNTER — Encounter: Payer: Self-pay | Admitting: Physician Assistant

## 2019-10-04 ENCOUNTER — Ambulatory Visit (INDEPENDENT_AMBULATORY_CARE_PROVIDER_SITE_OTHER): Payer: Medicare HMO | Admitting: Physician Assistant

## 2019-10-04 VITALS — BP 113/70 | HR 61 | Temp 96.9°F | Resp 16 | Wt 170.0 lb

## 2019-10-04 DIAGNOSIS — E119 Type 2 diabetes mellitus without complications: Secondary | ICD-10-CM

## 2019-10-04 LAB — POCT GLYCOSYLATED HEMOGLOBIN (HGB A1C)
Est. average glucose Bld gHb Est-mCnc: 194
Hemoglobin A1C: 8.4 % — AB (ref 4.0–5.6)

## 2019-10-04 MED ORDER — METFORMIN HCL 500 MG PO TABS: 1000.0000 mg | ORAL_TABLET | Freq: Every day | ORAL | 0 refills | Status: DC

## 2019-10-04 NOTE — Patient Instructions (Signed)
Carbohydrate Counting for Diabetes Mellitus, Adult  Carbohydrate counting is a method of keeping track of how many carbohydrates you eat. Eating carbohydrates naturally increases the amount of sugar (glucose) in the blood. Counting how many carbohydrates you eat helps keep your blood glucose within normal limits, which helps you manage your diabetes (diabetes mellitus). It is important to know how many carbohydrates you can safely have in each meal. This is different for every person. A diet and nutrition specialist (registered dietitian) can help you make a meal plan and calculate how many carbohydrates you should have at each meal and snack. Carbohydrates are found in the following foods:  Grains, such as breads and cereals.  Dried beans and soy products.  Starchy vegetables, such as potatoes, peas, and corn.  Fruit and fruit juices.  Milk and yogurt.  Sweets and snack foods, such as cake, cookies, candy, chips, and soft drinks. How do I count carbohydrates? There are two ways to count carbohydrates in food. You can use either of the methods or a combination of both. Reading "Nutrition Facts" on packaged food The "Nutrition Facts" list is included on the labels of almost all packaged foods and beverages in the [place] It includes:  The serving size.  Information about nutrients in each serving, including the grams (g) of carbohydrate per serving. To use the "Nutrition Facts":  Decide how many servings you will have.  Multiply the number of servings by the number of carbohydrates per serving.  The resulting number is the total amount of carbohydrates that you will be having. Learning standard serving sizes of other foods When you eat carbohydrate foods that are not packaged or do not include "Nutrition Facts" on the label, you need to measure the servings in order to count the amount of carbohydrates:  Measure the foods that you will eat with a food scale or measuring cup, if  needed.  Decide how many standard-size servings you will eat.  Multiply the number of servings by 15. Most carbohydrate-rich foods have about 15 g of carbohydrates per serving. ? For example, if you eat 8 oz (170 g) of strawberries, you will have eaten 2 servings and 30 g of carbohydrates (2 servings x 15 g = 30 g).  For foods that have more than one food mixed, such as soups and casseroles, you must count the carbohydrates in each food that is included. The following list contains standard serving sizes of common carbohydrate-rich foods. Each of these servings has about 15 g of carbohydrates:   hamburger bun or  English muffin.   oz (15 mL) syrup.   oz (14 g) jelly.  1 slice of bread.  1 six-inch tortilla.  3 oz (85 g) cooked rice or pasta.  4 oz (113 g) cooked dried beans.  4 oz (113 g) starchy vegetable, such as peas, corn, or potatoes.  4 oz (113 g) hot cereal.  4 oz (113 g) mashed potatoes or  of a large baked potato.  4 oz (113 g) canned or frozen fruit.  4 oz (120 mL) fruit juice.  4-6 crackers.  6 chicken nuggets.  6 oz (170 g) unsweetened dry cereal.  6 oz (170 g) plain fat-free yogurt or yogurt sweetened with artificial sweeteners.  8 oz (240 mL) milk.  8 oz (170 g) fresh fruit or one small piece of fruit.  24 oz (680 g) popped popcorn. Example of carbohydrate counting Sample meal  3 oz (85 g) chicken breast.  6 oz (170 g)   brown rice.  4 oz (113 g) corn.  8 oz (240 mL) milk.  8 oz (170 g) strawberries with sugar-free whipped topping. Carbohydrate calculation 1. Identify the foods that contain carbohydrates: ? Rice. ? Corn. ? Milk. ? Strawberries. 2. Calculate how many servings you have of each food: ? 2 servings rice. ? 1 serving corn. ? 1 serving milk. ? 1 serving strawberries. 3. Multiply each number of servings by 15 g: ? 2 servings rice x 15 g = 30 g. ? 1 serving corn x 15 g = 15 g. ? 1 serving milk x 15 g = 15 g. ? 1  serving strawberries x 15 g = 15 g. 4. Add together all of the amounts to find the total grams of carbohydrates eaten: ? 30 g + 15 g + 15 g + 15 g = 75 g of carbohydrates total. Summary  Carbohydrate counting is a method of keeping track of how many carbohydrates you eat.  Eating carbohydrates naturally increases the amount of sugar (glucose) in the blood.  Counting how many carbohydrates you eat helps keep your blood glucose within normal limits, which helps you manage your diabetes.  A diet and nutrition specialist (registered dietitian) can help you make a meal plan and calculate how many carbohydrates you should have at each meal and snack. This information is not intended to replace advice given to you by your health care provider. Make sure you discuss any questions you have with your health care provider. Document Revised: 03/10/2017 Document Reviewed: 01/28/2016 Elsevier Patient Education  2020 Elsevier Inc.  

## 2019-10-10 ENCOUNTER — Encounter: Payer: Self-pay | Admitting: Physician Assistant

## 2019-10-10 DIAGNOSIS — E119 Type 2 diabetes mellitus without complications: Secondary | ICD-10-CM

## 2019-10-10 NOTE — Telephone Encounter (Signed)
Forward to PCP.

## 2019-10-11 MED ORDER — METFORMIN HCL 500 MG PO TABS: 500.0000 mg | ORAL_TABLET | Freq: Every day | ORAL | 0 refills | Status: DC

## 2019-10-11 MED ORDER — EMPAGLIFLOZIN 10 MG PO TABS: 10.0000 mg | ORAL_TABLET | Freq: Every day | ORAL | 0 refills | Status: DC

## 2019-10-23 DIAGNOSIS — G4733 Obstructive sleep apnea (adult) (pediatric): Secondary | ICD-10-CM | POA: Diagnosis not present

## 2019-11-15 ENCOUNTER — Other Ambulatory Visit: Payer: Self-pay | Admitting: Physician Assistant

## 2019-11-15 DIAGNOSIS — E119 Type 2 diabetes mellitus without complications: Secondary | ICD-10-CM

## 2019-11-15 DIAGNOSIS — R69 Illness, unspecified: Secondary | ICD-10-CM | POA: Diagnosis not present

## 2019-11-27 ENCOUNTER — Encounter: Payer: Self-pay | Admitting: Physician Assistant

## 2019-11-27 DIAGNOSIS — E119 Type 2 diabetes mellitus without complications: Secondary | ICD-10-CM

## 2019-11-29 MED ORDER — METFORMIN HCL 500 MG PO TABS: 500.0000 mg | ORAL_TABLET | Freq: Two times a day (BID) | ORAL | 1 refills | Status: DC

## 2019-12-05 ENCOUNTER — Telehealth: Payer: Self-pay | Admitting: Physician Assistant

## 2019-12-05 DIAGNOSIS — E119 Type 2 diabetes mellitus without complications: Secondary | ICD-10-CM

## 2019-12-05 MED ORDER — METFORMIN HCL 500 MG PO TABS: 500.0000 mg | ORAL_TABLET | Freq: Two times a day (BID) | ORAL | 1 refills | Status: DC

## 2019-12-05 NOTE — Telephone Encounter (Signed)
Pt stated she spoke with her pharmacy and they still do not have rx for metFORMIN (GLUCOPHAGE) 500 MG tablet Requesting it to be resent or verbal given. Please advise.  [facility] [id number], [place] [address] Phone:  [phone]  Fax:  [phone]

## 2019-12-18 NOTE — Progress Notes (Signed)
Subjective:   Christine Richards is a 74 y.o. female who presents for Medicare Annual (Subsequent) preventive examination.    This visit is being conducted through telemedicine due to the COVID-19 pandemic. This patient has given me verbal consent via doximity to conduct this visit, patient states they are participating from their home address. Some vital signs may be absent or patient reported.    Patient identification: identified by name, DOB, and current address  Review of Systems:  N/A  Cardiac Risk Factors include: advanced age (>29mn, >>68women);diabetes mellitus     Objective:     Vitals: There were no vitals taken for this visit.  There is no height or weight on file to calculate BMI. Unable to obtain vitals due to visit being conducted via telephonically.   Advanced Directives 12/19/2019 09/26/2019 12/18/2018 11/04/2018 12/15/2017 03/31/2017 12/09/2016  Does Patient Have a Medical Advance Directive? No No Yes No No No No  Type of Advance Directive - - HGraftonin Chart? - - No - copy requested - - - -  Would patient like information on creating a medical advance directive? No - Patient declined No - Patient declined - No - Patient declined No - Patient declined No - Patient declined No - Patient declined    Tobacco Social History   Tobacco Use  Smoking Status Never Smoker  Smokeless Tobacco Never Used     Counseling given: Not Answered   Clinical Intake:  Pre-visit preparation completed: Yes  Pain : No/denies pain Pain Score: 0-No pain     Diabetes: Yes  How often do you need to have someone help you when you read instructions, pamphlets, or other written materials from your doctor or pharmacy?: 1 - Never   Diabetes:  Is the patient diabetic?  Yes  If diabetic, was a CBG obtained today?  No  Did the patient bring in their glucometer from home?  No  How often do you monitor your CBG's? Once to  twice daily.   Financial Strains and Diabetes Management:  Are you having any financial strains with the device, your supplies or your medication? No .  Does the patient want to be seen by Chronic Care Management for management of their diabetes?  No  Would the patient like to be referred to a Nutritionist or for Diabetic Management? No   Diabetic Exams:  Diabetic Eye Exam: Completed 08/27/19. Repeat yearly.   Diabetic Foot Exam: Completed 10/04/19. Repeat yearly.    Interpreter Needed?: No  Information [facility] :: [facility] L[facility]  Past Medical History:  Diagnosis Date  . Arthritis    rheumatoid  . Diabetes mellitus without complication ([place]   . Diverticulosis   . Environmental and seasonal allergies   . GERD (gastroesophageal reflux disease)   . Hyperlipidemia   . Osteopenia   . Sinus congestion   . Sleep apnea    uses CPAP  . Stroke ([facility]    TIA 6 years ago no issues since  . TIA (transient ischemic attack)    Past Surgical History:  Procedure Laterality Date  . BREAST BIOPSY Right 2010   benign  . BREAST CYST ASPIRATION Right   . BREAST SURGERY Right 2010   biopsy  . CATARACT EXTRACTION W/PHACO Right 11/11/2016   Procedure: CATARACT EXTRACTION PHACO AND INTRAOCULAR LENS PLACEMENT (IOC);  Surgeon: BEulogio Bear MD;  Location: [facility] ORS;  Service: Ophthalmology;  Laterality:  Right;  [place] 1:04.9 AP% 14.6 CDE 9.64 Fluid pack lot # [id number]  . CATARACT EXTRACTION W/PHACO Left 09/26/2019   Procedure: CATARACT EXTRACTION PHACO AND INTRAOCULAR LENS PLACEMENT (IOC) LEFT DIABETIC SYMFONY LENS 12.82 01:22.2 15.6%;  Surgeon: Leandrew Koyanagi, MD;  Location: [place];  Service: Ophthalmology;  Laterality: Left;  . ESOPHAGOGASTRODUODENOSCOPY (EGD) WITH PROPOFOL N/A 12/29/2015   Procedure: ESOPHAGOGASTRODUODENOSCOPY (EGD) WITH PROPOFOL;  Surgeon: Lucilla Lame, MD;  Location: [place];  Service: Endoscopy;  Laterality: N/A;  GASTRI BIOPSY  . TUBAL  LIGATION     Family History  Problem Relation Age of Onset  . Congestive Heart Failure Mother   . Heart attack Mother   . Dementia Father   . Heart attack Father   . Prostate cancer Father   . Atrial fibrillation Sister   . Hyperlipidemia Sister   . Heart disease Brother    Social History   Socioeconomic History  . Marital status: Widowed    Spouse name: Not on file  . Number of children: 2  . Years of education: College  . Highest education level: Bachelor's degree (e.g., BA, AB, BS)  Occupational History  . Occupation: [employer]    Comment: Full Time  Tobacco Use  . Smoking status: Never Smoker  . Smokeless tobacco: Never Used  Substance and Sexual Activity  . Alcohol use: No  . Drug use: No  . Sexual activity: Not on file  Other Topics Concern  . Not on file  Social History Narrative   2 biological children and 1 step child   Social Determinants of Health   Financial Resource Strain: Low Risk   . Difficulty of Paying Living Expenses: Not hard at all  Food Insecurity: No Food Insecurity  . Worried About Charity fundraiser in the Last Year: Never true  . Ran Out of Food in the Last Year: Never true  Transportation Needs: No Transportation Needs  . Lack of Transportation (Medical): No  . Lack of Transportation (Non-Medical): No  Physical Activity: Sufficiently Active  . Days of Exercise per Week: 5 days  . Minutes of Exercise per Session: 40 min  Stress: Stress Concern Present  . Feeling of Stress : To some extent  Social Connections: Somewhat Isolated  . Frequency of Communication with Friends and Family: More than three times a week  . Frequency of Social Gatherings with Friends and Family: More than three times a week  . Attends Religious Services: More than 4 times per year  . Active Member of Clubs or Organizations: No  . Attends Archivist Meetings: Never  . Marital Status: Widowed    Outpatient Encounter Medications  as of 12/19/2019  Medication Sig  . Blood Glucose Monitoring Suppl ([place]) w/Device KIT New kit with device to check blood sugar once daily  . Cholecalciferol (VITAMIN D) 2000 UNITS CAPS Take 2,000 Units by mouth 2 (two) times daily.   . Lancets (ONETOUCH DELICA PLUS [id number]) MISC CHECK BLOOD SUGAR TWICE DAILY AS DIRECTED  . metFORMIN (GLUCOPHAGE) 500 MG tablet Take 1 tablet (500 mg total) by mouth 2 (two) times daily with a meal. TAKE 1 TABLET(500 MG) BY MOUTH DAILY WITH BREAKFAST (Patient taking differently: Take 500 mg by mouth 2 (two) times daily with a meal. )  . Omega-3 Fatty Acids (FISH OIL) 1000 MG CAPS Take 1,000-2,000 mg by mouth at bedtime.   Glory Rosebush VERIO test strip CHECK BLOOD SUGAR TWICE DAILY  .  empagliflozin (JARDIANCE) 10 MG TABS tablet Take 10 mg by mouth daily before breakfast. (Patient not taking: Reported on 12/19/2019)   No facility-administered encounter medications on file as of 12/19/2019.    Activities of Daily Living In your present state of health, do you have any difficulty performing the following activities: 12/19/2019 09/26/2019  Hearing? Y N  Comment Bilateral hearing aids recommend- pt declined. -  Vision? N N  Difficulty concentrating or making decisions? N Y  Walking or climbing stairs? N N  Dressing or bathing? N N  Doing errands, shopping? N -  Preparing Food and eating ? N -  Using the Toilet? N -  In the past six months, have you accidently leaked urine? N -  Do you have problems with loss of bowel control? N -  Managing your Medications? N -  Managing your Finances? N -  Housekeeping or managing your Housekeeping? N -  Some recent data might be hidden    Patient Care Team: Mar Daring, PA-C as PCP - General (Family Medicine) Leandrew Koyanagi, MD as Referring Physician (Ophthalmology) Lucky Cowboy Erskine Squibb, MD as Referring Physician (Vascular Surgery)    Assessment:   This is a routine wellness examination for  Carly.  Exercise Activities and Dietary recommendations Current Exercise Habits: Structured exercise class, Type of exercise: strength training/weights;stretching(rides a stationary bike), Time (Minutes): 45, Frequency (Times/Week): 5, Weekly Exercise (Minutes/Week): 225, Intensity: Moderate, Exercise limited by: None identified  Goals    . DIET - INCREASE WATER INTAKE     Recommend increasing water intake to 4-6 glasses a day.        Fall Risk: Fall Risk  12/19/2019 12/18/2018 12/15/2017 03/31/2017 12/09/2016  Falls in the past year? 0 0 No No No  Number falls in past yr: 0 - - - -  Injury with Fall? 0 - - - -    FALL RISK PREVENTION PERTAINING TO THE HOME:  Any stairs in or around the home? Yes  If so, are there any without handrails? No   Home free of loose throw rugs in walkways, pet beds, electrical cords, etc? Yes  Adequate lighting in your home to reduce risk of falls? Yes   ASSISTIVE DEVICES UTILIZED TO PREVENT FALLS:  Life alert? No  Use of a cane, walker or w/c? No  Grab bars in the bathroom? No  Shower chair or bench in shower? No  Elevated toilet seat or a handicapped toilet? Yes   TIMED UP AND GO:  Was the test performed? No .    Depression Screen PHQ 2/9 Scores 12/19/2019 12/18/2018 07/07/2018 12/15/2017  PHQ - 2 Score 0 0 0 0  PHQ- 9 Score - - 0 -     Cognitive Function     6CIT Screen 12/19/2019 12/09/2016  What Year? 0 points 0 points  What month? 0 points 0 points  What time? 0 points 0 points  Count back from 20 0 points 0 points  Months in reverse 0 points 0 points  Repeat phrase 0 points 0 points  Total Score 0 0    Immunization History  Administered Date(s) Administered  . Pneumococcal Conjugate-13 08/06/2015  . Td 12/22/2007  . Tdap 12/22/2007    Qualifies for Shingles Vaccine? Yes . Due for Shingrix. Pt has been advised to call insurance company to determine out of pocket expense. Advised may also receive vaccine at local pharmacy or  Health Dept. Verbalized acceptance and understanding.  Tdap: Although this vaccine is not a  covered service during a Wellness Exam, does the patient still wish to receive this vaccine today?  No . Advised may receive this vaccine at local pharmacy or Health Dept. Aware to provide a copy of the vaccination record if obtained from local pharmacy or Health Dept. Verbalized acceptance and understanding.  Flu Vaccine: Due fall 2021  Pneumococcal Vaccine: Due for Pneumococcal vaccine. Does the patient want to receive this vaccine today?  No . Advised may receive this vaccine at local pharmacy or Health Dept. Aware to provide a copy of the vaccination record if obtained from local pharmacy or Health Dept. Verbalized acceptance and understanding.   Screening Tests Health Maintenance  Topic Date Due  . COVID-19 Vaccine (1) Never done  . URINE MICROALBUMIN  03/06/2020 (Originally 12/09/2017)  . PNA vac Low Risk Adult (2 of 2 - PPSV23) 03/06/2020 (Originally 08/05/2016)  . TETANUS/TDAP  10/03/2020 (Originally 12/21/2017)  . INFLUENZA VACCINE  03/30/2020  . HEMOGLOBIN A1C  04/02/2020  . MAMMOGRAM  05/02/2020  . OPHTHALMOLOGY EXAM  08/26/2020  . FOOT EXAM  10/03/2020  . COLONOSCOPY  12/14/2021  . DEXA SCAN  05/02/2024  . Hepatitis C Screening  Completed    Cancer Screenings:  Colorectal Screening: Completed 12/15/11. Repeat every 10 years.   Mammogram: Completed 05/03/19. Repeat every 1-2 years as advised.  Bone Density: Completed 05/03/19. Results reflect OSTEOPENIA. Repeat every 5 years.   Lung Cancer Screening: (Low Dose CT Chest recommended if Age 72-80 years, 30 pack-year currently smoking OR have quit w/in 15years.) does not qualify.   Additional Screening:  Hepatitis C Screening: Up to date  Dental Screening: Recommended annual dental exams for proper oral hygiene   Community Resource Referral:  CRR required this visit?  No       Plan:  I have personally reviewed and addressed the  Medicare Annual Wellness questionnaire and have noted the following in the patient's chart:  A. Medical and social history B. Use of alcohol, tobacco or illicit drugs  C. Current medications and supplements D. Functional ability and status E.  Nutritional status F.  Physical activity G. Advance directives H. List of other physicians I.  Hospitalizations, surgeries, and ER visits in previous 12 months J.  [place] such as hearing and vision if needed, cognitive and depression L. Referrals and appointments   In addition, I have reviewed and discussed with patient certain preventive protocols, quality metrics, and best practice recommendations. A written personalized care plan for preventive services as well as general preventive health recommendations were provided to patient.   Glendora Score, [place]  1/95/0932 Nurse Health Advisor   Nurse Notes: Pt declined receiving any vaccines that are due.

## 2019-12-19 ENCOUNTER — Ambulatory Visit (INDEPENDENT_AMBULATORY_CARE_PROVIDER_SITE_OTHER): Payer: Medicare HMO

## 2019-12-19 ENCOUNTER — Other Ambulatory Visit: Payer: Self-pay

## 2019-12-19 DIAGNOSIS — Z0000 Encounter for general adult medical examination without abnormal findings: Secondary | ICD-10-CM | POA: Diagnosis not present

## 2019-12-19 NOTE — Patient Instructions (Signed)
Christine Richards , Thank you for taking time to come for your Medicare Wellness Visit. I appreciate your ongoing commitment to your health goals. Please review the following plan we discussed and let me know if I can assist you in the future.   Screening recommendations/referrals: Colonoscopy: Up to date, due 11/2021 Mammogram: Up to date, due 04/2021 Bone Density: Up to date, due 04/2024 Recommended yearly ophthalmology/optometry visit for glaucoma screening and checkup Recommended yearly dental visit for hygiene and checkup  Vaccinations: Influenza vaccine: Due fall 2021. Pneumococcal vaccine: Pneumovax 23 due Tdap vaccine: Pt declines today.  Shingles vaccine: Pt declines today.     Advanced directives: Advance directive discussed with you today. Even though you declined this today please call our office should you change your mind and we can give you the proper paperwork for you to fill out.  Conditions/risks identified: Continue to increase water intake to 6-8 8 oz glasses a day.   Next appointment: 01/10/20 @ 2:40 PM with Fenton Malling. Declined scheduling an AWV for 2022 at this time.    Preventive Care 74 Years and Older, Female Preventive care refers to lifestyle choices and visits with your health care provider that can promote health and wellness. What does preventive care include?  A yearly physical exam. This is also called an annual well check.  Dental exams once or twice a year.  Routine eye exams. Ask your health care provider how often you should have your eyes checked.  Personal lifestyle choices, including:  Daily care of your teeth and gums.  Regular physical activity.  Eating a healthy diet.  Avoiding tobacco and drug use.  Limiting alcohol use.  Practicing safe sex.  Taking low-dose aspirin every day.  Taking vitamin and mineral supplements as recommended by your health care provider. What happens during an annual well check? The services and  screenings done by your health care provider during your annual well check will depend on your age, overall health, lifestyle risk factors, and family history of disease. Counseling  Your health care provider may ask you questions about your:  Alcohol use.  Tobacco use.  Drug use.  Emotional well-being.  Home and relationship well-being.  Sexual activity.  Eating habits.  History of falls.  Memory and ability to understand (cognition).  Work and work Statistician.  Reproductive health. Screening  You may have the following tests or measurements:  Height, weight, and BMI.  Blood pressure.  Lipid and cholesterol levels. These may be checked every 5 years, or more frequently if you are over 56 years old.  Skin check.  Lung cancer screening. You may have this screening every year starting at age 74 if you have a 30-pack-year history of smoking and currently smoke or have quit within the past 15 years.  Fecal occult blood test (FOBT) of the stool. You may have this test every year starting at age 74.  Flexible sigmoidoscopy or colonoscopy. You may have a sigmoidoscopy every 5 years or a colonoscopy every 10 years starting at age 74.  Hepatitis C blood test.  Hepatitis B blood test.  Sexually transmitted disease (STD) testing.  Diabetes screening. This is done by checking your blood sugar (glucose) after you have not eaten for a while (fasting). You may have this done every 1-3 years.  Bone density scan. This is done to screen for osteoporosis. You may have this done starting at age 74.  Mammogram. This may be done every 1-2 years. Talk to your health care provider  about how often you should have regular mammograms. Talk with your health care provider about your test results, treatment options, and if necessary, the need for more tests. Vaccines  Your health care provider may recommend certain vaccines, such as:  Influenza vaccine. This is recommended every  year.  Tetanus, diphtheria, and acellular pertussis (Tdap, Td) vaccine. You may need a Td booster every 10 years.  Zoster vaccine. You may need this after age 8.  Pneumococcal 13-valent conjugate (PCV13) vaccine. One dose is recommended after age 74.  Pneumococcal polysaccharide (PPSV23) vaccine. One dose is recommended after age 74. Talk to your health care provider about which screenings and vaccines you need and how often you need them. This information is not intended to replace advice given to you by your health care provider. Make sure you discuss any questions you have with your health care provider. Document Released: 09/12/2015 Document Revised: 05/05/2016 Document Reviewed: 06/17/2015 Elsevier Interactive Patient Education  2017 [employer] Prevention in the Home Falls can cause injuries. They can happen to people of all ages. There are many things you can do to make your home safe and to help prevent falls. What can I do on the outside of my home?  Regularly fix the edges of walkways and driveways and fix any cracks.  Remove anything that might make you trip as you walk through a door, such as a raised step or threshold.  Trim any bushes or trees on the path to your home.  Use bright outdoor lighting.  Clear any walking paths of anything that might make someone trip, such as rocks or tools.  Regularly check to see if handrails are loose or broken. Make sure that both sides of any steps have handrails.  Any raised decks and porches should have guardrails on the edges.  Have any leaves, snow, or ice cleared regularly.  Use sand or salt on walking paths during winter.  Clean up any spills in your garage right away. This includes oil or grease spills. What can I do in the bathroom?  Use night lights.  Install grab bars by the toilet and in the tub and shower. Do not use towel bars as grab bars.  Use non-skid mats or decals in the tub or shower.  If you  need to sit down in the shower, use a plastic, non-slip stool.  Keep the floor dry. Clean up any water that spills on the floor as soon as it happens.  Remove soap buildup in the tub or shower regularly.  Attach bath mats securely with double-sided non-slip rug tape.  Do not have throw rugs and other things on the floor that can make you trip. What can I do in the bedroom?  Use night lights.  Make sure that you have a light by your bed that is easy to reach.  Do not use any sheets or blankets that are too big for your bed. They should not hang down onto the floor.  Have a firm chair that has side arms. You can use this for support while you get dressed.  Do not have throw rugs and other things on the floor that can make you trip. What can I do in the kitchen?  Clean up any spills right away.  Avoid walking on wet floors.  Keep items that you use a lot in easy-to-reach places.  If you need to reach something above you, use a strong step stool that has a grab bar.  Keep electrical cords out of the way.  Do not use floor polish or wax that makes floors slippery. If you must use wax, use non-skid floor wax.  Do not have throw rugs and other things on the floor that can make you trip. What can I do with my stairs?  Do not leave any items on the stairs.  Make sure that there are handrails on both sides of the stairs and use them. Fix handrails that are broken or loose. Make sure that handrails are as long as the stairways.  Check any carpeting to make sure that it is firmly attached to the stairs. Fix any carpet that is loose or worn.  Avoid having throw rugs at the top or bottom of the stairs. If you do have throw rugs, attach them to the floor with carpet tape.  Make sure that you have a light switch at the top of the stairs and the bottom of the stairs. If you do not have them, ask someone to add them for you. What else can I do to help prevent falls?  Wear shoes  that:  Do not have high heels.  Have rubber bottoms.  Are comfortable and fit you well.  Are closed at the toe. Do not wear sandals.  If you use a stepladder:  Make sure that it is fully opened. Do not climb a closed stepladder.  Make sure that both sides of the stepladder are locked into place.  Ask someone to hold it for you, if possible.  Clearly mark and make sure that you can see:  Any grab bars or handrails.  First and last steps.  Where the edge of each step is.  Use tools that help you move around (mobility aids) if they are needed. These include:  Canes.  Walkers.  Scooters.  Crutches.  Turn on the lights when you go into a dark area. Replace any light bulbs as soon as they burn out.  Set up your furniture so you have a clear path. Avoid moving your furniture around.  If any of your floors are uneven, fix them.  If there are any pets around you, be aware of where they are.  Review your medicines with your doctor. Some medicines can make you feel dizzy. This can increase your chance of falling. Ask your doctor what other things that you can do to help prevent falls. This information is not intended to replace advice given to you by your health care provider. Make sure you discuss any questions you have with your health care provider. Document Released: 06/12/2009 Document Revised: 01/22/2016 Document Reviewed: 09/20/2014 Elsevier Interactive Patient Education  2017 [employer].

## 2020-01-03 ENCOUNTER — Ambulatory Visit: Payer: Medicare HMO | Admitting: Physician Assistant

## 2020-01-04 ENCOUNTER — Encounter: Payer: Self-pay | Admitting: Physician Assistant

## 2020-01-04 DIAGNOSIS — G720 Drug-induced myopathy: Secondary | ICD-10-CM | POA: Insufficient documentation

## 2020-01-10 ENCOUNTER — Encounter: Payer: Self-pay | Admitting: Physician Assistant

## 2020-01-10 ENCOUNTER — Other Ambulatory Visit: Payer: Self-pay

## 2020-01-10 ENCOUNTER — Ambulatory Visit (INDEPENDENT_AMBULATORY_CARE_PROVIDER_SITE_OTHER): Payer: Medicare HMO | Admitting: Physician Assistant

## 2020-01-10 VITALS — BP 138/79 | HR 65 | Temp 97.1°F | Resp 16 | Wt 165.0 lb

## 2020-01-10 DIAGNOSIS — E119 Type 2 diabetes mellitus without complications: Secondary | ICD-10-CM | POA: Diagnosis not present

## 2020-01-10 DIAGNOSIS — M79642 Pain in left hand: Secondary | ICD-10-CM | POA: Diagnosis not present

## 2020-01-10 LAB — POCT GLYCOSYLATED HEMOGLOBIN (HGB A1C)
Est. average glucose Bld gHb Est-mCnc: 154
Hemoglobin A1C: 7 % — AB (ref 4.0–5.6)

## 2020-01-10 NOTE — Progress Notes (Signed)
I,Roshena L Chambers,acting as a scribe for [employer], PA-C.,have documented all relevant documentation on the behalf of Mar Daring, PA-C,as directed by  Mar Daring, PA-C while in the presence of Mar Daring, [place].  Established patient visit   Patient: Christine Richards.   DOB: June 23, 1946   74 y.o. Female  MRN: [id number] Visit Date: 01/10/2020  Today's healthcare provider: Mar Daring, PA-C   Chief Complaint  Patient presents with  . Diabetes   Subjective    HPI Diabetes Mellitus Type II, Follow-up  Lab Results  Component Value Date   HGBA1C 7.0 (A) 01/10/2020   HGBA1C 8.4 (A) 10/04/2019   HGBA1C 8.4 (A) 06/26/2019   Wt Readings from Last 3 Encounters:  01/10/20 165 lb (74.8 kg)  10/04/19 170 lb (77.1 kg)  09/26/19 173 lb (78.5 kg)   Last seen for diabetes 3 months ago.  Management since then includes increasing Metformin from 585m daily to 10066mdaily. Dosage was later decreased back down to 50038maily due to higher dose causing stomach cramping, bloating and indigestion.   Today patient states she was able to titrate back up to 500m43mice daily of Metformin without any intolerances.  She reports good compliance with treatment. She is not having side effects.  Symptoms: No fatigue No foot ulcerations  No appetite changes No nausea  No paresthesia of the feet  No polydipsia  No polyuria No visual disturbances   No vomiting     Home blood sugar records: fasting range: 120-173  Episodes of hypoglycemia? No    Current insulin regiment: none Most Recent Eye Exam: 08/26/2020 Current exercise: cardiovascular workout on exercise equipment and [employer] gym Current diet habits: well balanced  Pertinent Labs: Lab Results  Component Value Date   CHOL 383 (H) 03/08/2019   HDL 61 03/08/2019   LDLCALC 285 (H) 03/08/2019   TRIG 186 (H) 03/08/2019   CHOLHDL 6.3 (H) 03/08/2019   Lab Results  Component Value  Date   NA 136 03/08/2019   K 3.9 03/08/2019   CREATININE 0.78 03/08/2019   GFRNONAA 76 03/08/2019   GFRAA 87 03/08/2019   GLUCOSE 190 (H) 03/08/2019     -------------------------------------------------------------------------------- Hand pain: Patient complains of pain of the left hand Pain worsens when riding her bicycle.  Pain is located at the base of the thumb near the metacarpal-carpal joint.    Patient Active Problem List   Diagnosis Date Noted  . 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 Cystocele with prolapse 03/07/2019  . Heartburn   . Gastritis   . Situational anxiety 11/20/2015  . Arthritis 02/11/2015  . Allergic rhinitis 12/25/2014  . Aneurysm (HCC)[place]/27/2016  . Carotid stenosis 12/25/2014  . Controlled diabetes mellitus type II without complication (HCC)[place]/209/73/5329Aerophobia 12/25/2014  . Fibrositis 12/25/2014  . Headache, migraine 12/25/2014  . Osteopenia 12/25/2014  . Avitaminosis D 12/25/2014  . 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 Personal history of TIA (transient ischemic attack) 09/01/2009  . 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 Acquired hypothyroidism 04/25/2009  . Hypercholesteremia 02/20/2007  . 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 Apnea, sleep 12/02/2006   Past Medical History:  Diagnosis Date  . Arthritis    rheumatoid  . Diabetes mellitus without complication (HCC)[place]. Diverticulosis   . Environmental and seasonal allergies   . GERD (gastroesophageal reflux disease)   . Hyperlipidemia   . Osteopenia   . Sinus  congestion   . Sleep apnea    uses CPAP  . Stroke [facility])    TIA 6 years ago no issues since  . TIA (transient ischemic attack)        Medications: Outpatient Medications Prior to Visit  Medication Sig  . Blood Glucose Monitoring Suppl ([place]) w/Device KIT New kit with device to check blood sugar once daily  . Cholecalciferol (VITAMIN D) 2000 UNITS CAPS Take 2,000  Units by mouth 2 (two) times daily.   . Lancets (ONETOUCH DELICA PLUS URKYHC62B) MISC CHECK BLOOD SUGAR TWICE DAILY AS DIRECTED  . metFORMIN (GLUCOPHAGE) 500 MG tablet Take 1 tablet (500 mg total) by mouth 2 (two) times daily with a meal. TAKE 1 TABLET(500 MG) BY MOUTH DAILY WITH BREAKFAST (Patient taking differently: Take 500 mg by mouth 2 (two) times daily with a meal. )  . Omega-3 Fatty Acids (FISH OIL) 1000 MG CAPS Take 1,000-2,000 mg by mouth at bedtime.   Glory Rosebush VERIO test strip CHECK BLOOD SUGAR TWICE DAILY  . empagliflozin (JARDIANCE) 10 MG TABS tablet Take 10 mg by mouth daily before breakfast. (Patient not taking: Reported on 01/10/2020)   No facility-administered medications prior to visit.    Review of Systems  Constitutional: Negative for appetite change, chills, fatigue and fever.  Respiratory: Negative for chest tightness and shortness of breath.   Cardiovascular: Negative for chest pain and palpitations.  Gastrointestinal: Negative for abdominal pain, nausea and vomiting.  Musculoskeletal: Positive for myalgias (right calf pain since racing her grandson on the beach).  Neurological: Negative for dizziness and weakness.    Last CBC Lab Results  Component Value Date   WBC 5.2 03/08/2019   HGB 14.3 03/08/2019   HCT 42.6 03/08/2019   MCV 95 03/08/2019   MCH 31.7 03/08/2019   RDW 12.2 03/08/2019   PLT 234 76/28/3151   Last metabolic panel Lab Results  Component Value Date   GLUCOSE 190 (H) 03/08/2019   NA 136 03/08/2019   K 3.9 03/08/2019   CL 97 03/08/2019   CO2 22 03/08/2019   BUN 21 03/08/2019   CREATININE 0.78 03/08/2019   GFRNONAA 76 03/08/2019   GFRAA 87 03/08/2019   CALCIUM 9.7 03/08/2019   PROT 6.7 03/08/2019   ALBUMIN 4.7 03/08/2019   LABGLOB 2.0 03/08/2019   AGRATIO 2.4 (H) 03/08/2019   BILITOT 0.6 03/08/2019   ALKPHOS 84 03/08/2019   AST 18 03/08/2019   ALT 12 03/08/2019   ANIONGAP 9 11/04/2018   Last hemoglobin A1c Lab Results    Component Value Date   HGBA1C 7.0 (A) 01/10/2020      Objective    BP 138/79 (BP Location: Right Arm, Patient Position: Sitting, Cuff Size: Large)   Pulse 65   Temp (!) 97.1 F (36.2 C)   Resp 16   Wt 165 lb (74.8 kg)   BMI 27.46 kg/m  BP Readings from Last 3 Encounters:  01/10/20 138/79  10/04/19 113/70  09/26/19 121/66   Wt Readings from Last 3 Encounters:  01/10/20 165 lb (74.8 kg)  10/04/19 170 lb (77.1 kg)  09/26/19 173 lb (78.5 kg)      Physical Exam Vitals reviewed.  Constitutional:      Appearance: Normal appearance. She is overweight.  HENT:     Head: Normocephalic and atraumatic.  Eyes:     General: No scleral icterus.    Extraocular Movements: Extraocular movements intact.  Cardiovascular:     Rate and Rhythm: Normal rate  and regular rhythm.     Pulses: Normal pulses.     Heart sounds: Normal heart sounds. No murmur.  Pulmonary:     Effort: Pulmonary effort is normal.     Breath sounds: Normal breath sounds. No wheezing.  Musculoskeletal:     Left hand: Tenderness and bony tenderness present. No swelling, deformity or lacerations. Normal range of motion. Normal strength. Normal sensation. There is no disruption of two-point discrimination. Normal capillary refill. Normal pulse.     Comments: Pain and tenderness over left 1st metacarpal-carpal joint and over the thenar eminence. No atrophy noted  Skin:    General: Skin is warm and dry.  Neurological:     General: No focal deficit present.     Mental Status: She is alert and oriented to person, place, and time. Mental status is at baseline.  Psychiatric:        Mood and Affect: Mood normal.        Behavior: Behavior normal. Behavior is cooperative.       Results for orders placed or performed in visit on 01/10/20  POCT HgB A1C  Result Value Ref Range   Hemoglobin A1C 7.0 (A) 4.0 - 5.6 %   Est. average glucose Bld gHb Est-mCnc 154     Assessment & Plan     1. Controlled type 2 diabetes  mellitus without complication, without long-term current use of insulin (HCC) Well controlled with today's A1c 7.0.  Down from 8.4. Continue current medications. UTD on vaccines, eye exam, foot exam. Discussed diet and exercise F/u in 3 months for CPE.  - POCT HgB A1C  2. Hand pain, left New. Recommend wearing a padded glove with cycling. If pain worsens or persists will order hand x ray.     No follow-ups on file.      Reynolds Bowl, PA-C, have reviewed all documentation for this visit. The documentation on 01/11/20 for the exam, diagnosis, procedures, and orders are all accurate and complete.   Rubye Beach  [facility] [phone] (phone) [phone] (fax)  [place]

## 2020-01-11 ENCOUNTER — Encounter: Payer: Self-pay | Admitting: Physician Assistant

## 2020-01-15 ENCOUNTER — Ambulatory Visit (INDEPENDENT_AMBULATORY_CARE_PROVIDER_SITE_OTHER): Payer: Medicare HMO | Admitting: Vascular Surgery

## 2020-01-15 ENCOUNTER — Encounter (INDEPENDENT_AMBULATORY_CARE_PROVIDER_SITE_OTHER): Payer: Medicare HMO

## 2020-02-15 ENCOUNTER — Other Ambulatory Visit: Payer: Self-pay | Admitting: Physician Assistant

## 2020-02-15 DIAGNOSIS — E119 Type 2 diabetes mellitus without complications: Secondary | ICD-10-CM

## 2020-02-15 DIAGNOSIS — R69 Illness, unspecified: Secondary | ICD-10-CM | POA: Diagnosis not present

## 2020-03-19 ENCOUNTER — Other Ambulatory Visit: Payer: Self-pay | Admitting: Physician Assistant

## 2020-03-19 DIAGNOSIS — Z1231 Encounter for screening mammogram for malignant neoplasm of breast: Secondary | ICD-10-CM

## 2020-03-20 DIAGNOSIS — G4733 Obstructive sleep apnea (adult) (pediatric): Secondary | ICD-10-CM | POA: Diagnosis not present

## 2020-04-18 ENCOUNTER — Encounter: Payer: Self-pay | Admitting: Physician Assistant

## 2020-04-18 ENCOUNTER — Other Ambulatory Visit: Payer: Self-pay

## 2020-04-18 ENCOUNTER — Ambulatory Visit (INDEPENDENT_AMBULATORY_CARE_PROVIDER_SITE_OTHER): Payer: Medicare HMO | Admitting: Physician Assistant

## 2020-04-18 VITALS — BP 121/71 | HR 72 | Temp 98.2°F | Resp 16 | Ht 65.5 in | Wt 164.2 lb

## 2020-04-18 DIAGNOSIS — E559 Vitamin D deficiency, unspecified: Secondary | ICD-10-CM

## 2020-04-18 DIAGNOSIS — G720 Drug-induced myopathy: Secondary | ICD-10-CM | POA: Diagnosis not present

## 2020-04-18 DIAGNOSIS — Z0000 Encounter for general adult medical examination without abnormal findings: Secondary | ICD-10-CM

## 2020-04-18 DIAGNOSIS — E119 Type 2 diabetes mellitus without complications: Secondary | ICD-10-CM

## 2020-04-18 DIAGNOSIS — E7800 Pure hypercholesterolemia, unspecified: Secondary | ICD-10-CM | POA: Diagnosis not present

## 2020-04-18 DIAGNOSIS — E039 Hypothyroidism, unspecified: Secondary | ICD-10-CM | POA: Diagnosis not present

## 2020-04-18 LAB — POCT UA - MICROALBUMIN: Microalbumin Ur, POC: 50 mg/L

## 2020-04-18 NOTE — Patient Instructions (Signed)
Health Maintenance After Age 74 After age 74, you are at a higher risk for certain long-term diseases and infections as well as injuries from falls. Falls are a major cause of broken bones and head injuries in people who are older than age 74. Getting regular preventive care can help to keep you healthy and well. Preventive care includes getting regular testing and making lifestyle changes as recommended by your health care provider. Talk with your health care provider about:  Which screenings and tests you should have. A screening is a test that checks for a disease when you have no symptoms.  A diet and exercise plan that is right for you. What should I know about screenings and tests to prevent falls? Screening and testing are the best ways to find a health problem early. Early diagnosis and treatment give you the best chance of managing medical conditions that are common after age 74. Certain conditions and lifestyle choices may make you more likely to have a fall. Your health care provider may recommend:  Regular vision checks. Poor vision and conditions such as cataracts can make you more likely to have a fall. If you wear glasses, make sure to get your prescription updated if your vision changes.  Medicine review. Work with your health care provider to regularly review all of the medicines you are taking, including over-the-counter medicines. Ask your health care provider about any side effects that may make you more likely to have a fall. Tell your health care provider if any medicines that you take make you feel dizzy or sleepy.  Osteoporosis screening. Osteoporosis is a condition that causes the bones to get weaker. This can make the bones weak and cause them to break more easily.  Blood pressure screening. Blood pressure changes and medicines to control blood pressure can make you feel dizzy.  Strength and balance checks. Your health care provider may recommend certain tests to check your  strength and balance while standing, walking, or changing positions.  Foot health exam. Foot pain and numbness, as well as not wearing proper footwear, can make you more likely to have a fall.  Depression screening. You may be more likely to have a fall if you have a fear of falling, feel emotionally low, or feel unable to do activities that you used to do.  Alcohol use screening. Using too much alcohol can affect your balance and may make you more likely to have a fall. What actions can I take to lower my risk of falls? General instructions  Talk with your health care provider about your risks for falling. Tell your health care provider if: ? You fall. Be sure to tell your health care provider about all falls, even ones that seem minor. ? You feel dizzy, sleepy, or off-balance.  Take over-the-counter and prescription medicines only as told by your health care provider. These include any supplements.  Eat a healthy diet and maintain a healthy weight. A healthy diet includes low-fat dairy products, low-fat (lean) meats, and fiber from whole grains, beans, and lots of fruits and vegetables. Home safety  Remove any tripping hazards, such as rugs, cords, and clutter.  Install safety equipment such as grab bars in bathrooms and safety rails on stairs.  Keep rooms and walkways well-lit. Activity   Follow a regular exercise program to stay fit. This will help you maintain your balance. Ask your health care provider what types of exercise are appropriate for you.  If you need a cane or   walker, use it as recommended by your health care provider.  Wear supportive shoes that have nonskid soles. Lifestyle  Do not drink alcohol if your health care provider tells you not to drink.  If you drink alcohol, limit how much you have: ? 0-1 drink a day for women. ? 0-2 drinks a day for men.  Be aware of how much alcohol is in your drink. In the [place] one drink equals one typical bottle of beer (12  oz), one-half glass of wine (5 oz), or one shot of hard liquor (1 oz).  Do not use any products that contain nicotine or tobacco, such as cigarettes and e-cigarettes. If you need help quitting, ask your health care provider. Summary  Having a healthy lifestyle and getting preventive care can help to protect your health and wellness after age 74.  Screening and testing are the best way to find a health problem early and help you avoid having a fall. Early diagnosis and treatment give you the best chance for managing medical conditions that are more common for people who are older than age 74.  Falls are a major cause of broken bones and head injuries in people who are older than age 74. Take precautions to prevent a fall at home.  Work with your health care provider to learn what changes you can make to improve your health and wellness and to prevent falls. This information is not intended to replace advice given to you by your health care provider. Make sure you discuss any questions you have with your health care provider. Document Revised: 12/07/2018 Document Reviewed: 06/29/2017 Elsevier Patient Education  2020 [employer]  

## 2020-04-18 NOTE — Progress Notes (Signed)
Complete physical exam   Patient: Christine Richards.   DOB: December 23, 1945   74 y.o. Female  MRN: [id number] Visit Date: 04/18/2020  Today's healthcare provider: Mar Daring, PA-C   Chief Complaint  Patient presents with  . Annual Exam   Subjective    [name] is a 74 y.o. female who presents today for a complete physical exam.  She reports consuming a Well balanced diet. Gym/ health club routine includes cardio, light weights and rides her bike. She generally feels well. She reports sleeping well. She does not have additional problems to discuss today.  HPI  Had AWV with [facility] 12/19/19 Mammogram: 05/08/20-scheduled  Past Medical History:  Diagnosis Date  . Arthritis    rheumatoid  . Diabetes mellitus without complication ([place])   . Diverticulosis   . Environmental and seasonal allergies   . GERD (gastroesophageal reflux disease)   . Hyperlipidemia   . Osteopenia   . Sinus congestion   . Sleep apnea    uses CPAP  . Stroke [facility])    TIA 6 years ago no issues since  . TIA (transient ischemic attack)    Past Surgical History:  Procedure Laterality Date  . BREAST BIOPSY Right 2010   benign  . BREAST CYST ASPIRATION Right   . BREAST SURGERY Right 2010   biopsy  . CATARACT EXTRACTION W/PHACO Right 11/11/2016   Procedure: CATARACT EXTRACTION PHACO AND INTRAOCULAR LENS PLACEMENT (IOC);  Surgeon: Eulogio Bear, MD;  Location: [facility];  Service: Ophthalmology;  Laterality: Right;  [place] 1:04.9 AP% 14.6 CDE 9.64 Fluid pack lot # [id number]  . CATARACT EXTRACTION W/PHACO Left 09/26/2019   Procedure: CATARACT EXTRACTION PHACO AND INTRAOCULAR LENS PLACEMENT (IOC) LEFT DIABETIC SYMFONY LENS 12.82 01:22.2 15.6%;  Surgeon: Leandrew Koyanagi, MD;  Location: [place];  Service: Ophthalmology;  Laterality: Left;  . ESOPHAGOGASTRODUODENOSCOPY (EGD) WITH PROPOFOL N/A 12/29/2015   Procedure: ESOPHAGOGASTRODUODENOSCOPY (EGD) WITH PROPOFOL;  Surgeon: Lucilla Lame, MD;   Location: [place];  Service: Endoscopy;  Laterality: N/A;  GASTRI BIOPSY  . TUBAL LIGATION     Social History   Socioeconomic History  . Marital status: Widowed    Spouse name: Not on file  . Number of children: 2  . Years of education: College  . Highest education level: Bachelor's degree (e.g., BA, AB, BS)  Occupational History  . Occupation: [employer]    Comment: Full Time  Tobacco Use  . Smoking status: Never Smoker  . Smokeless tobacco: Never Used  Vaping Use  . Vaping Use: Never used  Substance and Sexual Activity  . Alcohol use: No  . Drug use: No  . Sexual activity: Not on file  Other Topics Concern  . Not on file  Social History Narrative   2 biological children and 1 step child   Social Determinants of Health   Financial Resource Strain: Low Risk   . Difficulty of Paying Living Expenses: Not hard at Richards  Food Insecurity: No Food Insecurity  . Worried About Charity fundraiser in the Last Year: Never true  . Ran Out of Food in the Last Year: Never true  Transportation Needs: No Transportation Needs  . Lack of Transportation (Medical): No  . Lack of Transportation (Non-Medical): No  Physical Activity: Sufficiently Active  . Days of Exercise per Week: 5 days  . Minutes of Exercise per Session: 40 min  Stress: Stress Concern Present  . Feeling of Stress :  To some extent  Social Connections: Moderately Isolated  . Frequency of Communication with Friends and Family: More than three times a week  . Frequency of Social Gatherings with Friends and Family: More than three times a week  . Attends Religious Services: More than 4 times per year  . Active Member of Clubs or Organizations: No  . Attends Archivist Meetings: Never  . Marital Status: Widowed  Intimate Partner Violence: Not At Risk  . Fear of Current or Ex-Partner: No  . Emotionally Abused: No  . Physically Abused: No  . Sexually Abused: No   Family Status    Relation Name Status  . Mother  Deceased  . Father  Deceased  . Sister  Alive  . Brother  Deceased at age 80       MVA  . Brother  Alive  . Brother  Alive   Family History  Problem Relation Age of Onset  . Congestive Heart Failure Mother   . Heart attack Mother   . Dementia Father   . Heart attack Father   . Prostate cancer Father   . Atrial fibrillation Sister   . Hyperlipidemia Sister   . Heart disease Brother    Allergies  Allergen Reactions  . Antihistamines, Chlorpheniramine-Type     Facial swelling   . Macrobid [Nitrofurantoin Macrocrystal]     unknown  . Statins     Muscle Pain, Weakness     Patient Care Team: Mar Daring, PA-C as PCP - General (Family Medicine) Leandrew Koyanagi, MD as Referring Physician (Ophthalmology) Lucky Cowboy Erskine Squibb, MD as Referring Physician (Vascular Surgery)   Medications: Outpatient Medications Prior to Visit  Medication Sig  . Blood Glucose Monitoring Suppl ([place]) w/Device KIT New kit with device to check blood sugar once daily  . Cholecalciferol (VITAMIN D) 2000 UNITS CAPS Take 2,000 Units by mouth 2 (two) times daily.   . Lancets (ONETOUCH DELICA PLUS [id number]) MISC CHECK BLOOD SUGAR TWICE DAILY AS DIRECTED  . metFORMIN (GLUCOPHAGE) 500 MG tablet Take 1 tablet (500 mg total) by mouth 2 (two) times daily with a meal. TAKE 1 TABLET(500 MG) BY MOUTH DAILY WITH BREAKFAST (Patient taking differently: Take 500 mg by mouth 2 (two) times daily with a meal. )  . Omega-3 Fatty Acids (FISH OIL) 1000 MG CAPS Take 1,000-2,000 mg by mouth at bedtime.   Glory Rosebush VERIO test strip CHECK BLOOD SUGAR TWICE DAILY   No facility-administered medications prior to visit.    Review of Systems  Constitutional: Positive for diaphoresis.  HENT: Positive for postnasal drip, rhinorrhea and tinnitus.        Very itching ears-L worse  Eyes: Negative.   Respiratory: Positive for apnea.   Cardiovascular: Negative.    Gastrointestinal: Negative.   Endocrine: Negative.   Musculoskeletal: Negative.   Skin: Negative.   Allergic/Immunologic: Negative.   Neurological: Negative.   Hematological: Negative.   Psychiatric/Behavioral: Negative.     Last CBC Lab Results  Component Value Date   WBC 8.2 04/18/2020   HGB 13.8 04/18/2020   HCT 41.7 04/18/2020   MCV 95 04/18/2020   MCH 31.6 04/18/2020   RDW 12.4 04/18/2020   PLT 242 44/81/8563   Last metabolic panel Lab Results  Component Value Date   GLUCOSE 135 (H) 04/18/2020   NA 140 04/18/2020   K 4.1 04/18/2020   CL 102 04/18/2020   CO2 22 04/18/2020   BUN 16 04/18/2020   CREATININE 0.65 04/18/2020  GFRNONAA 88 04/18/2020   GFRAA 101 04/18/2020   CALCIUM 9.9 04/18/2020   PROT 6.9 04/18/2020   ALBUMIN 4.5 04/18/2020   LABGLOB 2.4 04/18/2020   AGRATIO 1.9 04/18/2020   BILITOT <0.2 04/18/2020   ALKPHOS 89 04/18/2020   AST 14 04/18/2020   ALT 14 04/18/2020   ANIONGAP 9 11/04/2018      Objective    BP 121/71 (BP Location: Left Arm, Patient Position: Sitting, Cuff Size: Large)   Pulse 72   Temp 98.2 F (36.8 C) (Oral)   Resp 16   Ht 5' 5.5" (1.664 m)   Wt 164 lb 3.2 oz (74.5 kg)   BMI 26.91 kg/m  BP Readings from Last 3 Encounters:  04/18/20 121/71  01/10/20 138/79  10/04/19 113/70   Wt Readings from Last 3 Encounters:  04/18/20 164 lb 3.2 oz (74.5 kg)  01/10/20 165 lb (74.8 kg)  10/04/19 170 lb (77.1 kg)      Physical Exam Vitals reviewed.  Constitutional:      General: She is not in acute distress.    Appearance: Normal appearance. She is well-developed, well-groomed and overweight. She is not ill-appearing or diaphoretic.  HENT:     Head: Normocephalic and atraumatic.     Right Ear: External ear normal.     Left Ear: External ear normal.     Nose: Nose normal.     Mouth/Throat:     Mouth: Mucous membranes are moist.     Pharynx: Oropharynx is clear. No oropharyngeal exudate.  Eyes:     General: No scleral  icterus.       Right eye: No discharge.        Left eye: No discharge.     Extraocular Movements: Extraocular movements intact.     Conjunctiva/sclera: Conjunctivae normal.     Pupils: Pupils are equal, round, and reactive to light.  Neck:     Thyroid: No thyromegaly.     Vascular: No carotid bruit or JVD.     Trachea: No tracheal deviation.  Cardiovascular:     Rate and Rhythm: Normal rate and regular rhythm.     Pulses: Normal pulses.     Heart sounds: Normal heart sounds. No murmur heard.  No friction rub. No gallop.   Pulmonary:     Effort: Pulmonary effort is normal. No respiratory distress.     Breath sounds: Normal breath sounds. No wheezing or rales.  Chest:     Chest wall: No tenderness.  Abdominal:     General: Abdomen is flat. Bowel sounds are normal. There is no distension.     Palpations: Abdomen is soft. There is no mass.     Tenderness: There is no abdominal tenderness. There is no guarding or rebound.  Musculoskeletal:        General: No tenderness. Normal range of motion.     Cervical back: Normal range of motion and neck supple.     Right lower leg: No edema.     Left lower leg: No edema.  Lymphadenopathy:     Cervical: No cervical adenopathy.  Skin:    General: Skin is warm and dry.     Capillary Refill: Capillary refill takes less than 2 seconds.     Findings: No rash.  Neurological:     General: No focal deficit present.     Mental Status: She is alert and oriented to person, place, and time. Mental status is at baseline.  Psychiatric:  Mood and Affect: Mood normal.        Behavior: Behavior normal. Behavior is cooperative.        Thought Content: Thought content normal.        Judgment: Judgment normal.       Last depression screening scores PHQ 2/9 Scores 12/19/2019 12/18/2018 07/07/2018  PHQ - 2 Score 0 0 0  PHQ- 9 Score - - 0   Last fall risk screening Fall Risk  12/19/2019  Falls in the past year? 0  Number falls in past yr: 0    Injury with Fall? 0   Last Audit-C alcohol use screening Alcohol Use Disorder Test (AUDIT) 12/19/2019  1. How often do you have a drink containing alcohol? 0  2. How many drinks containing alcohol do you have on a typical day when you are drinking? 0  3. How often do you have six or more drinks on one occasion? 0  AUDIT-C Score 0  Alcohol Brief Interventions/Follow-up AUDIT Score <7 follow-up not indicated   A score of 3 or more in women, and 4 or more in men indicates increased risk for alcohol abuse, EXCEPT if Richards of the points are from question 1   No results found for any visits on 04/18/20.  Assessment & Plan    Routine Health Maintenance and Physical Exam  Exercise Activities and Dietary recommendations Goals    . DIET - INCREASE WATER INTAKE     Recommend increasing water intake to 4-6 glasses a day.        Immunization History  Administered Date(s) Administered  . Pneumococcal Conjugate-13 08/06/2015  . Td 12/22/2007  . Tdap 12/22/2007    Health Maintenance  Topic Date Due  . COVID-19 Vaccine (1) Never done  . PNA vac Low Risk Adult (2 of 2 - PPSV23) 08/05/2016  . URINE MICROALBUMIN  12/09/2017  . INFLUENZA VACCINE  03/30/2020  . TETANUS/TDAP  10/03/2020 (Originally 12/21/2017)  . MAMMOGRAM  05/02/2020  . HEMOGLOBIN A1C  07/12/2020  . OPHTHALMOLOGY EXAM  08/26/2020  . FOOT EXAM  10/03/2020  . COLONOSCOPY  12/14/2021  . DEXA SCAN  05/02/2024  . Hepatitis C Screening  Completed    Discussed health benefits of physical activity, and encouraged her to engage in regular exercise appropriate for her age and condition.  1. Annual physical exam Normal physical exam today. Will check labs as below and f/u pending lab results. If labs are stable and WNL she will not need to have these rechecked for one year at her next annual physical exam. She is to call the office in the meantime if she has any acute issue, questions or concerns. 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 Will check labs as below and f/u pending results. - CBC with Differential/Platelet - Comprehensive metabolic panel  3. Acquired hypothyroidism Asymptomatic. No medication required at this time. Will check labs as below and f/u pending results. - CBC with Differential/Platelet - Comprehensive metabolic panel - TSH  4. Controlled type 2 diabetes mellitus without complication, without long-term current use of insulin (HCC) Stable. Continue metformin 566m BID. Will check labs as below and f/u pending results. - CBC with Differential/Platelet - Comprehensive metabolic panel - Hemoglobin A1c - Lipid panel - POCT UA - Microalbumin  5. Hypercholesteremia Diet controlled. Intolerant to statins, zetia and tricor. Will check labs as below and f/u pending results. - CBC with Differential/Platelet - Comprehensive metabolic panel -  Hemoglobin A1c - Lipid panel  6. Avitaminosis D Postmenopausal. Continue Vit D 4000 IU daily. Will check labs as below and f/u pending results. - CBC with Differential/Platelet - Vitamin D (25 hydroxy)   No follow-ups on file.     Reynolds Bowl, PA-C, have reviewed Richards documentation for this visit. The documentation on 04/23/20 for the exam, diagnosis, procedures, and orders are Richards accurate and complete.   Rubye Beach  [facility] [phone] (phone) [phone] (fax)  [place]

## 2020-04-19 LAB — CBC WITH DIFFERENTIAL/PLATELET
Basophils Absolute: 0.1 10*3/uL (ref 0.0–0.2)
Basos: 1 %
EOS (ABSOLUTE): 0.1 10*3/uL (ref 0.0–0.4)
Eos: 1 %
Hematocrit: 41.7 % (ref 34.0–46.6)
Hemoglobin: 13.8 g/dL (ref 11.1–15.9)
Immature Grans (Abs): 0 10*3/uL (ref 0.0–0.1)
Immature Granulocytes: 0 %
Lymphocytes Absolute: 1.5 10*3/uL (ref 0.7–3.1)
Lymphs: 19 %
MCH: 31.6 pg (ref 26.6–33.0)
MCHC: 33.1 g/dL (ref 31.5–35.7)
MCV: 95 fL (ref 79–97)
Monocytes Absolute: 0.5 10*3/uL (ref 0.1–0.9)
Monocytes: 6 %
Neutrophils Absolute: 6.1 10*3/uL (ref 1.4–7.0)
Neutrophils: 73 %
Platelets: 242 10*3/uL (ref 150–450)
RBC: 4.37 x10E6/uL (ref 3.77–5.28)
RDW: 12.4 % (ref 11.7–15.4)
WBC: 8.2 10*3/uL (ref 3.4–10.8)

## 2020-04-19 LAB — HEMOGLOBIN A1C
Est. average glucose Bld gHb Est-mCnc: 160 mg/dL
Hgb A1c MFr Bld: 7.2 % — ABNORMAL HIGH (ref 4.8–5.6)

## 2020-04-19 LAB — COMPREHENSIVE METABOLIC PANEL
ALT: 14 IU/L (ref 0–32)
AST: 14 IU/L (ref 0–40)
Albumin/Globulin Ratio: 1.9 (ref 1.2–2.2)
Albumin: 4.5 g/dL (ref 3.7–4.7)
Alkaline Phosphatase: 89 IU/L (ref 48–121)
BUN/Creatinine Ratio: 25 (ref 12–28)
BUN: 16 mg/dL (ref 8–27)
Bilirubin Total: <0.2 mg/dL (ref 0.0–1.2)
CO2: 22 mmol/L (ref 20–29)
Calcium: 9.9 mg/dL (ref 8.7–10.3)
Chloride: 102 mmol/L (ref 96–106)
Creatinine, Ser: 0.65 mg/dL (ref 0.57–1.00)
GFR calc Af Amer: 101 mL/min/{1.73_m2} (ref >59)
GFR calc non Af Amer: 88 mL/min/{1.73_m2} (ref >59)
Globulin, Total: 2.4 g/dL (ref 1.5–4.5)
Glucose: 135 mg/dL — ABNORMAL HIGH (ref 65–99)
Potassium: 4.1 mmol/L (ref 3.5–5.2)
Sodium: 140 mmol/L (ref 134–144)
Total Protein: 6.9 g/dL (ref 6.0–8.5)

## 2020-04-19 LAB — LIPID PANEL
Chol/HDL Ratio: 7 ratio — ABNORMAL HIGH (ref 0.0–4.4)
Cholesterol, Total: 365 mg/dL — ABNORMAL HIGH (ref 100–199)
HDL: 52 mg/dL (ref >39)
LDL Chol Calc (NIH): 240 mg/dL — ABNORMAL HIGH (ref 0–99)
Triglycerides: 339 mg/dL — ABNORMAL HIGH (ref 0–149)
VLDL Cholesterol Cal: 73 mg/dL — ABNORMAL HIGH (ref 5–40)

## 2020-04-19 LAB — TSH: TSH: 1.74 u[IU]/mL (ref 0.450–4.500)

## 2020-04-19 LAB — VITAMIN D 25 HYDROXY (VIT D DEFICIENCY, FRACTURES): Vit D, 25-Hydroxy: 29.4 ng/mL — ABNORMAL LOW (ref 30.0–100.0)

## 2020-04-21 ENCOUNTER — Telehealth: Payer: Self-pay

## 2020-04-21 NOTE — Telephone Encounter (Signed)
Pt states has tried fenofibrate in the past and did not tolerated it.  She does not want to try any medicines at this time.   Thanks,   -Mickel Baas

## 2020-04-21 NOTE — Telephone Encounter (Signed)
Patient advised as directed below. PEr patient she takes 4000 IU of Vitamin D and reports that she tried Zetia before and gave her severe diarrhea.

## 2020-04-21 NOTE — Telephone Encounter (Signed)
-----   Message from Mar Daring, [place] sent at 04/21/2020  7:21 AM EDT ----- Blood count is normal. Kidney and liver function are normal. Sodium, potassium, and calcium are normal. A1c did increase just slightly from 7.0 to 7.2. Would not make any medication changes, but just continue to work on healthy lifestyle modifications. Cholesterol does remain elevated. Triglycerides appear elevated due to non-fasting. I know you are intolerant to statin medications. There is a medication called zetia we could use to help lower cholesterol some if you were agreeable. Thyroid is normal. Vit D is borderline low. Make sure to take OTC Vit D supplement daily.

## 2020-04-21 NOTE — Telephone Encounter (Signed)
OK. Has she ever tried Tricor (fenofibrate)?

## 2020-04-23 ENCOUNTER — Encounter: Payer: Self-pay | Admitting: Physician Assistant

## 2020-05-06 DIAGNOSIS — D3131 Benign neoplasm of right choroid: Secondary | ICD-10-CM | POA: Diagnosis not present

## 2020-05-06 LAB — HM DIABETES EYE EXAM

## 2020-05-08 ENCOUNTER — Encounter: Payer: Self-pay | Admitting: Physician Assistant

## 2020-05-08 LAB — HM DIABETES EYE EXAM

## 2020-05-09 ENCOUNTER — Encounter: Payer: Self-pay | Admitting: Physician Assistant

## 2020-05-15 ENCOUNTER — Ambulatory Visit (INDEPENDENT_AMBULATORY_CARE_PROVIDER_SITE_OTHER): Payer: Medicare HMO | Admitting: Vascular Surgery

## 2020-05-15 ENCOUNTER — Encounter (INDEPENDENT_AMBULATORY_CARE_PROVIDER_SITE_OTHER): Payer: Medicare HMO

## 2020-05-22 ENCOUNTER — Other Ambulatory Visit: Payer: Self-pay | Admitting: Physician Assistant

## 2020-05-22 DIAGNOSIS — E119 Type 2 diabetes mellitus without complications: Secondary | ICD-10-CM

## 2020-05-22 NOTE — Telephone Encounter (Signed)
Medication Refill - Medication: metFORMIN (GLUCOPHAGE) 500 MG tablet (Patient would like medication sent to new pharmacy due to lower cots for prescription)   Has the patient contacted their pharmacy?yes (Agent: If no, request that the patient contact the pharmacy for the refill.) (Agent: If yes, when and what did the pharmacy advise?)Contact PCP  Preferred Pharmacy (with phone number or street name):  [place], [place] Phone:  [phone]  Fax:  [phone]       Agent: Please be advised that RX refills may take up to 3 business days. We ask that you follow-up with your pharmacy.

## 2020-05-23 ENCOUNTER — Telehealth: Payer: Self-pay | Admitting: Physician Assistant

## 2020-05-23 DIAGNOSIS — E119 Type 2 diabetes mellitus without complications: Secondary | ICD-10-CM

## 2020-05-23 MED ORDER — METFORMIN HCL 500 MG PO TABS: 500.0000 mg | ORAL_TABLET | Freq: Two times a day (BID) | ORAL | 1 refills | Status: DC

## 2020-05-23 NOTE — Telephone Encounter (Signed)
Pharmacy called to verify directions for Metformin / RX says  Take 1 tablet (500 mg total) by mouth 2 (two) times daily with a meal. TAKE 1 TABLET(500 MG) BY MOUTH DAILY WITH BREAKFAST       They would like to know if she is taking 2 daily with a meal or 1 daily with breakfast / please advise

## 2020-05-23 NOTE — Telephone Encounter (Signed)
Updated dose

## 2020-05-27 ENCOUNTER — Other Ambulatory Visit: Payer: Self-pay | Admitting: Physician Assistant

## 2020-05-27 DIAGNOSIS — E119 Type 2 diabetes mellitus without complications: Secondary | ICD-10-CM

## 2020-06-03 ENCOUNTER — Ambulatory Visit (INDEPENDENT_AMBULATORY_CARE_PROVIDER_SITE_OTHER): Payer: Medicare HMO

## 2020-06-03 ENCOUNTER — Ambulatory Visit (INDEPENDENT_AMBULATORY_CARE_PROVIDER_SITE_OTHER): Payer: Medicare HMO | Admitting: Nurse Practitioner

## 2020-06-03 ENCOUNTER — Encounter (INDEPENDENT_AMBULATORY_CARE_PROVIDER_SITE_OTHER): Payer: Self-pay | Admitting: Nurse Practitioner

## 2020-06-03 ENCOUNTER — Other Ambulatory Visit: Payer: Self-pay

## 2020-06-03 VITALS — BP 133/78 | HR 72 | Ht 65.0 in | Wt 165.0 lb

## 2020-06-03 DIAGNOSIS — E119 Type 2 diabetes mellitus without complications: Secondary | ICD-10-CM

## 2020-06-03 DIAGNOSIS — I6523 Occlusion and stenosis of bilateral carotid arteries: Secondary | ICD-10-CM | POA: Diagnosis not present

## 2020-06-03 DIAGNOSIS — E7800 Pure hypercholesterolemia, unspecified: Secondary | ICD-10-CM | POA: Diagnosis not present

## 2020-06-04 ENCOUNTER — Encounter (INDEPENDENT_AMBULATORY_CARE_PROVIDER_SITE_OTHER): Payer: Self-pay | Admitting: Nurse Practitioner

## 2020-06-04 NOTE — Progress Notes (Signed)
Subjective:    Patient ID: Christine Cage., female    DOB: May 04, 1946, 73 y.o.   MRN: [id number] Chief Complaint  Patient presents with  . Follow-up    6 mo carotid    The patient is seen for follow up evaluation of carotid stenosis. The carotid stenosis followed by ultrasound.   The patient denies amaurosis fugax. There is no recent history of TIA symptoms or focal motor deficits. There is no prior documented CVA.  The patient is taking enteric-coated aspirin 81 mg daily.  There is no history of migraine headaches. There is no history of seizures.  The patient has a history of coronary artery disease, no recent episodes of angina or shortness of breath. The patient denies PAD or claudication symptoms. There is a history of hyperlipidemia which is being treated with a statin.    Duplex ultrasound shows 01-75% RICA and 1-02% LICA stenosis. Marland Kitchen  No change compared to last study in 07/13/2019   Review of Systems  Eyes: Negative for visual disturbance.  Neurological: Negative for weakness.  All other systems reviewed and are negative.      Objective:   Physical Exam Vitals reviewed.  HENT:     Head: Normocephalic.  Neck:     Vascular: No carotid bruit.  Cardiovascular:     Rate and Rhythm: Normal rate and regular rhythm.     Pulses: Normal pulses.  Pulmonary:     Effort: Pulmonary effort is normal.  Musculoskeletal:        General: Normal range of motion.  Skin:    General: Skin is warm and dry.  Neurological:     General: No focal deficit present.     Mental Status: She is alert and oriented to person, place, and time. Mental status is at baseline.  Psychiatric:        Mood and Affect: Mood normal.        Behavior: Behavior normal.        Thought Content: Thought content normal.        Judgment: Judgment normal.     BP 133/78   Pulse 72   Ht _0  (1.651 m)   Wt 165 lb (74.8 kg)   BMI 27.46 kg/m   Past Medical History:  Diagnosis Date  . Arthritis     rheumatoid  . Diabetes mellitus without complication ([place])   . Diverticulosis   . Environmental and seasonal allergies   . GERD (gastroesophageal reflux disease)   . Hyperlipidemia   . Osteopenia   . Sinus congestion   . Sleep apnea    uses CPAP  . Stroke [facility])    TIA 6 years ago no issues since  . TIA (transient ischemic attack)     Social History   Socioeconomic History  . Marital status: Widowed    Spouse name: Not on file  . Number of children: 2  . Years of education: College  . Highest education level: Bachelor's degree (e.g., BA, AB, BS)  Occupational History  . Occupation: [employer]    Comment: Full Time  Tobacco Use  . Smoking status: Never Smoker  . Smokeless tobacco: Never Used  Vaping Use  . Vaping Use: Never used  Substance and Sexual Activity  . Alcohol use: No  . Drug use: No  . Sexual activity: Not on file  Other Topics Concern  . Not on file  Social History Narrative   2 biological children and 1 step child  Social Determinants of Health   Financial Resource Strain: Low Risk   . Difficulty of Paying Living Expenses: Not hard at all  Food Insecurity: No Food Insecurity  . Worried About Charity fundraiser in the Last Year: Never true  . Ran Out of Food in the Last Year: Never true  Transportation Needs: No Transportation Needs  . Lack of Transportation (Medical): No  . Lack of Transportation (Non-Medical): No  Physical Activity: Sufficiently Active  . Days of Exercise per Week: 5 days  . Minutes of Exercise per Session: 40 min  Stress: Stress Concern Present  . Feeling of Stress : To some extent  Social Connections: Moderately Isolated  . Frequency of Communication with Friends and Family: More than three times a week  . Frequency of Social Gatherings with Friends and Family: More than three times a week  . Attends Religious Services: More than 4 times per year  . Active Member of Clubs or Organizations: No  .  Attends Archivist Meetings: Never  . Marital Status: Widowed  Intimate Partner Violence: Not At Risk  . Fear of Current or Ex-Partner: No  . Emotionally Abused: No  . Physically Abused: No  . Sexually Abused: No    Past Surgical History:  Procedure Laterality Date  . BREAST BIOPSY Right 2010   benign  . BREAST CYST ASPIRATION Right   . BREAST SURGERY Right 2010   biopsy  . CATARACT EXTRACTION W/PHACO Right 11/11/2016   Procedure: CATARACT EXTRACTION PHACO AND INTRAOCULAR LENS PLACEMENT (IOC);  Surgeon: Eulogio Bear, MD;  Location: [facility]S;  Service: Ophthalmology;  Laterality: Right;  [place] 1:04.9 AP% 14.6 CDE 9.64 Fluid pack lot # [id number]  . CATARACT EXTRACTION W/PHACO Left 09/26/2019   Procedure: CATARACT EXTRACTION PHACO AND INTRAOCULAR LENS PLACEMENT (IOC) LEFT DIABETIC SYMFONY LENS 12.82 01:22.2 15.6%;  Surgeon: Leandrew Koyanagi, MD;  Location: [place];  Service: Ophthalmology;  Laterality: Left;  . ESOPHAGOGASTRODUODENOSCOPY (EGD) WITH PROPOFOL N/A 12/29/2015   Procedure: ESOPHAGOGASTRODUODENOSCOPY (EGD) WITH PROPOFOL;  Surgeon: Lucilla Lame, MD;  Location: [place];  Service: Endoscopy;  Laterality: N/A;  GASTRI BIOPSY  . TUBAL LIGATION      Family History  Problem Relation Age of Onset  . Congestive Heart Failure Mother   . Heart attack Mother   . Dementia Father   . Heart attack Father   . Prostate cancer Father   . Atrial fibrillation Sister   . Hyperlipidemia Sister   . Heart disease Brother     Allergies  Allergen Reactions  . Antihistamines, Chlorpheniramine-Type     Facial swelling   . Macrobid [Nitrofurantoin Macrocrystal]     unknown  . Statins     Muscle Pain, Weakness        Assessment & Plan:   1. Bilateral carotid artery stenosis Recommend:  Given the patient's asymptomatic subcritical stenosis no further invasive testing or surgery at this time.  Duplex ultrasound shows 88-41% RICA and 6-60% LICA  stenosis.  Continue antiplatelet therapy as prescribed Continue management of CAD, HTN and Hyperlipidemia Healthy heart diet,  encouraged exercise at least 4 times per week Follow up in 6 months with duplex ultrasound and physical exam   2. Hypercholesteremia Continue statin as ordered and reviewed, no changes at this time   3. Controlled type 2 diabetes mellitus without complication, without long-term current use of insulin (HCC) Continue hypoglycemic medications as already ordered, these medications have been reviewed and there are no changes at  this time.  Hgb A1C to be monitored as already arranged by primary service    Current Outpatient Medications on File Prior to Visit  Medication Sig Dispense Refill  . Blood Glucose Monitoring Suppl ([place]) w/Device KIT New kit with device to check blood sugar once daily 1 kit 0  . Cholecalciferol (VITAMIN D) 2000 UNITS CAPS Take 2,000 Units by mouth 2 (two) times daily.     . Lancets (ONETOUCH DELICA PLUS [id number]) MISC CHECK BLOOD SUGAR TWICE DAILY AS DIRECTED 100 each 1  . metFORMIN (GLUCOPHAGE) 500 MG tablet Take 1 tablet (500 mg total) by mouth 2 (two) times daily with a meal. 180 tablet 1  . Omega-3 Fatty Acids (FISH OIL) 1000 MG CAPS Take 1,000-2,000 mg by mouth at bedtime.     Glory Rosebush VERIO test strip CHECK BLOOD SUGAR TWICE DAILY 200 strip 1   No current facility-administered medications on file prior to visit.    There are no Patient Instructions on file for this visit. No follow-ups on file.   Kris Hartmann, NP

## 2020-06-08 ENCOUNTER — Encounter: Payer: Self-pay | Admitting: Physician Assistant

## 2020-06-08 DIAGNOSIS — E119 Type 2 diabetes mellitus without complications: Secondary | ICD-10-CM

## 2020-06-09 MED ORDER — METFORMIN HCL 500 MG PO TABS: 500.0000 mg | ORAL_TABLET | Freq: Two times a day (BID) | ORAL | 1 refills | Status: DC

## 2020-07-09 ENCOUNTER — Ambulatory Visit
Admission: RE | Admit: 2020-07-09 | Discharge: 2020-07-09 | Disposition: A | Discharge status: Home / Self Care | Payer: Medicare HMO | Source: Ambulatory Visit | Attending: Physician Assistant | Admitting: Physician Assistant

## 2020-07-09 ENCOUNTER — Other Ambulatory Visit: Payer: Self-pay

## 2020-07-09 DIAGNOSIS — Z1231 Encounter for screening mammogram for malignant neoplasm of breast: Secondary | ICD-10-CM | POA: Insufficient documentation

## 2020-07-11 ENCOUNTER — Telehealth: Payer: Self-pay

## 2020-07-11 NOTE — Telephone Encounter (Signed)
-----   Message from Mar Daring, PA-C sent at 07/11/2020  8:18 AM EST ----- Normal mammogram. Repeat screening in one year.

## 2020-07-11 NOTE — Telephone Encounter (Signed)
Patient was advised.  

## 2020-07-21 ENCOUNTER — Ambulatory Visit (INDEPENDENT_AMBULATORY_CARE_PROVIDER_SITE_OTHER): Payer: Medicare HMO | Admitting: Physician Assistant

## 2020-07-21 ENCOUNTER — Encounter: Payer: Self-pay | Admitting: Physician Assistant

## 2020-07-21 ENCOUNTER — Other Ambulatory Visit: Payer: Self-pay

## 2020-07-21 VITALS — BP 113/60 | HR 72 | Temp 98.6°F | Resp 16 | Wt 165.6 lb

## 2020-07-21 DIAGNOSIS — E119 Type 2 diabetes mellitus without complications: Secondary | ICD-10-CM | POA: Diagnosis not present

## 2020-07-21 DIAGNOSIS — E7800 Pure hypercholesterolemia, unspecified: Secondary | ICD-10-CM

## 2020-07-21 DIAGNOSIS — G720 Drug-induced myopathy: Secondary | ICD-10-CM

## 2020-07-21 LAB — POCT GLYCOSYLATED HEMOGLOBIN (HGB A1C)
Est. average glucose Bld gHb Est-mCnc: 157
Hemoglobin A1C: 7.1 % — AB (ref 4.0–5.6)

## 2020-07-21 MED ORDER — DAPAGLIFLOZIN PROPANEDIOL 10 MG PO TABS: 10.0000 mg | ORAL_TABLET | Freq: Every day | ORAL | 0 refills | Status: DC

## 2020-07-21 MED ORDER — ICOSAPENT ETHYL 1 G PO CAPS: 1.0000 g | ORAL_CAPSULE | Freq: Two times a day (BID) | ORAL | 1 refills | Status: DC

## 2020-07-21 NOTE — Patient Instructions (Signed)
Dapagliflozin tablets What is this medicine? DAPAGLIFLOZIN (DAP a gli FLOE zin) controls blood sugar in people with diabetes. It is used with lifestyle changes like diet and exercise. It also treats heart failure. It may lower the need for treatment of heart failure in the hospital. This medicine may be used for other purposes; ask your health care provider or pharmacist if you have questions. COMMON BRAND NAME(S): Wilder Glade What should I tell my health care provider before I take this medicine? They need to know if you have any of these conditions:  dehydration  diabetic ketoacidosis  diet low in salt  eating less due to illness, surgery, dieting, or any other reason  having surgery  history of pancreatitis or pancreas problems  history of yeast infection of the penis or vagina  if you often drink alcohol  infections in the bladder, kidneys, or urinary tract  kidney disease  low blood pressure  on hemodialysis  problems urinating  type 1 diabetes  uncircumcised female  an unusual or allergic reaction to dapagliflozin, other medicines, foods, dyes, or preservatives  pregnant or trying to get pregnant  breast-feeding How should I use this medicine? Take this medicine by mouth with a glass of water. Follow the directions on the prescription label. You can take it with or without food. If it upsets your stomach, take it with food. Take this medicine in the morning. Take your dose at the same time each day. Do not take more often than directed. Do not stop taking except on your doctor's advice. A special MedGuide will be given to you by the pharmacist with each prescription and refill. Be sure to read this information carefully each time. Talk to your pediatrician regarding the use of this medicine in children. Special care may be needed. Overdosage: If you think you have taken too much of this medicine contact a poison control center or emergency room at once. NOTE: This  medicine is only for you. Do not share this medicine with others. What if I miss a dose? If you miss a dose, take it as soon as you can. If it is almost time for your next dose, take only that dose. Do not take double or extra doses. What may interact with this medicine? Do not take this medicine with any of the following medications:  gatifloxacin This medicine may also interact with the following medications:  alcohol  certain medicines for blood pressure, heart disease  diuretics  insulin  nateglinide  pioglitazone  quinolone antibiotics like ciprofloxacin, levofloxacin, ofloxacin  repaglinide  some herbal dietary supplements  steroid medicines like prednisone or cortisone  sulfonylureas like glimepiride, glipizide, glyburide  thyroid medicine This list may not describe all possible interactions. Give your health care provider a list of all the medicines, herbs, non-prescription drugs, or dietary supplements you use. Also tell them if you smoke, drink alcohol, or use illegal drugs. Some items may interact with your medicine. What should I watch for while using this medicine? Visit your doctor or health care professional for regular checks on your progress. This medicine can cause a serious condition in which there is too much acid in the blood. If you develop nausea, vomiting, stomach pain, unusual tiredness, or breathing problems, stop taking this medicine and call your doctor right away. If possible, use a ketone dipstick to check for ketones in your urine. A test called the HbA1C (A1C) will be monitored. This is a simple blood test. It measures your blood sugar control over  the last 2 to 3 months. You will receive this test every 3 to 6 months. Learn how to check your blood sugar. Learn the symptoms of low and high blood sugar and how to manage them. Always carry a quick-source of sugar with you in case you have symptoms of low blood sugar. Examples include hard sugar  candy or glucose tablets. Make sure others know that you can choke if you eat or drink when you develop serious symptoms of low blood sugar, such as seizures or unconsciousness. They must get medical help at once. Tell your doctor or health care professional if you have high blood sugar. You might need to change the dose of your medicine. If you are sick or exercising more than usual, you might need to change the dose of your medicine. Do not skip meals. Ask your doctor or health care professional if you should avoid alcohol. Many nonprescription cough and cold products contain sugar or alcohol. These can affect blood sugar. Wear a medical ID bracelet or chain, and carry a card that describes your disease and details of your medicine and dosage times. What side effects may I notice from receiving this medicine? Side effects that you should report to your doctor or health care professional as soon as possible:  allergic reactions like skin rash, itching or hives, swelling of the face, lips, or tongue  breathing problems  dizziness  feeling faint or lightheaded, falls  muscle weakness  nausea, vomiting, unusual stomach upset or pain  new pain or tenderness, change in skin color, sores or ulcers, or infection in legs or feet  penile discharge, itching, or pain in men  signs and symptoms of a genital infection, such as fever; tenderness, redness, or swelling in the genitals or area from the genitals to the back of the rectum  signs and symptoms of low blood sugar such as feeling anxious, confusion, dizziness, increased hunger, unusually weak or tired, sweating, shakiness, cold, irritable, headache, blurred vision, fast heartbeat, loss of consciousness  signs and symptoms of a urinary tract infection, such as fever, chills, a burning feeling when urinating, blood in the urine, back pain  trouble passing urine or change in the amount of urine, including an urgent need to urinate more often, in  larger amounts, or at night  unusual tiredness  vaginal discharge, itching, or odor in women Side effects that usually do not require medical attention (report to your doctor or health care professional if they continue or are bothersome):  mild increase in urination  thirsty This list may not describe all possible side effects. Call your doctor for medical advice about side effects. You may report side effects to FDA at 1-800-FDA-1088. Where should I keep my medicine? Keep out of the reach of children. Store at room temperature between 15 and 30 degrees C (59 and 86 degrees F). Throw away any unused medicine after the expiration date. NOTE: This sheet is a summary. It may not cover all possible information. If you have questions about this medicine, talk to your doctor, pharmacist, or health care provider.  2020 Elsevier/Gold Standard (2019-01-04 18:58:14)  Vascepa/Icosapent ethyl capsules What is this medicine? ICOSAPENT ETHYL (eye KOE sa pent eth il) contains essential fats. It is used to treat high triglyceride levels. Diet and lifestyle changes are often used with this drug. This medicine may be used for other purposes; ask your health care provider or pharmacist if you have questions. COMMON BRAND NAME(S): VASCEPA What should I tell my  health care provider before I take this medicine? They need to know if you have any of these conditions:  bleeding disorders  liver disease  an unusual or allergic reaction to icosapent ethyl, fish, shellfish, other medicines, foods, dyes, or preservatives  history of irregular heartbeat  pregnant or trying to get pregnant  breast-feeding How should I use this medicine? Take this medicine by mouth with a glass of water. Follow the directions on the prescription label. Take this medicine with food. Do not cut, crush, dissolve, or chew this medicine. Take your medicine at regular intervals. Do not take it more often than directed. Do not stop  taking except on your doctor's advice. Talk to your pediatrician regarding the use of this medicine in children. Special care may be needed. Overdosage: If you think you have taken too much of this medicine contact a poison control center or emergency room at once. NOTE: This medicine is only for you. Do not share this medicine with others. What if I miss a dose? If you miss a dose, take it as soon as you can. If it is almost time for your next dose, take only that dose. Do not take double or extra doses. What may interact with this medicine? This medicine may interact with the following medications:  aspirin and aspirin-like medicines  beta-blockers like metoprolol and propranolol  certain medicines that treat or prevent blood clots like warfarin, enoxaparin, dalteparin, apixaban, dabigatran, and rivaroxaban  diuretics  female hormones, like estrogens and birth control pills This list may not describe all possible interactions. Give your health care provider a list of all the medicines, herbs, non-prescription drugs, or dietary supplements you use. Also tell them if you smoke, drink alcohol, or use illegal drugs. Some items may interact with your medicine. What should I watch for while using this medicine? You may need blood work done while you are taking this medicine. Follow a good diet and exercise plan. Taking this medicine does not replace a healthy lifestyle. Some foods that have omega-3 fatty acids naturally are fatty fish like albacore tuna, halibut, herring, mackerel, lake trout, salmon, and sardines. If you are scheduled for any medical or dental procedure, tell your healthcare provider that you are taking this medicine. You may need to stop taking this medicine before the procedure. What side effects may I notice from receiving this medicine? Side effects that you should report to your doctor or health care professional as soon as possible:  allergic reactions like skin rash,  itching or hives, swelling of the face, lips, or tongue  breathing problems  unusual bleeding or bruising  fast, irregular heartbeat Side effects that usually do not require medical attention (report to your doctor or health care professional if they continue or are bothersome):  muscle or joint pain  sore throat  swelling of the ankles, feet, hands  constipation  Gout This list may not describe all possible side effects. Call your doctor for medical advice about side effects. You may report side effects to FDA at 1-800-FDA-1088. Where should I keep my medicine? Keep out of the reach of children. Store at room temperature between 15 and 30 degrees C (59 and 86 degrees F). Throw away any unused medicine after the expiration date. NOTE: This sheet is a summary. It may not cover all possible information. If you have questions about this medicine, talk to your doctor, pharmacist, or health care provider.  2020 Elsevier/Gold Standard (2018-08-15 18:35:46)

## 2020-07-21 NOTE — Progress Notes (Signed)
Established patient visit   Patient: Christine Richards.   DOB: March 08, 1946   74 y.o. Female  MRN: [id number] Visit Date: 07/21/2020  Today's healthcare provider: Mar Daring, PA-C   Chief Complaint  Patient presents with  . Follow-up   Subjective    HPI Patient Declined Covid Vaccine and Influenza Vaccine.  Diabetes Mellitus Type II, Follow-up  Lab Results  Component Value Date   HGBA1C 7.1 (A) 07/21/2020   HGBA1C 7.2 (H) 04/18/2020   HGBA1C 7.0 (A) 01/10/2020   Wt Readings from Last 3 Encounters:  07/21/20 165 lb 9.6 oz (75.1 kg)  06/03/20 165 lb (74.8 kg)  04/18/20 164 lb 3.2 oz (74.5 kg)   Last seen for diabetes 3 months ago.  Management since then includes continue current medication and working on lifestyle changes. She reports excellent compliance with treatment. She is having side effects. Having some lose stools, stomach discomfort.  Symptoms: No fatigue No foot ulcerations  No appetite changes No nausea  No paresthesia of the feet  No polydipsia  Yes polyuria No visual disturbances   No vomiting     Reports that she is having frequent urination to the point that she may not make it on time.   Home blood sugar records: fasting range: 150-160  Episodes of hypoglycemia? No    Current insulin regiment: none Most Recent Eye Exam: UTD Current exercise: cardiovascular workout on exercise equipment and weightlifting Current diet habits: in general, a "healthy" diet  , well balanced  Pertinent Labs: Lab Results  Component Value Date   CHOL 365 (H) 04/18/2020   HDL 52 04/18/2020   LDLCALC 240 (H) 04/18/2020   TRIG 339 (H) 04/18/2020   CHOLHDL 7.0 (H) 04/18/2020   Lab Results  Component Value Date   NA 140 04/18/2020   K 4.1 04/18/2020   CREATININE 0.65 04/18/2020   GFRNONAA 88 04/18/2020   GFRAA 101 04/18/2020   GLUCOSE 135 (H) 04/18/2020      ---------------------------------------------------------------------------------------------------  Patient Active Problem List   Diagnosis Date Noted  . 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 Cystocele with prolapse 03/07/2019  . Heartburn   . Gastritis   . Situational anxiety 11/20/2015  . Arthritis 02/11/2015  . Allergic rhinitis 12/25/2014  . Aneurysm ([place]) 12/25/2014  . Carotid stenosis 12/25/2014  . Controlled diabetes mellitus type II without complication ([place]) 59/97/7414  . Aerophobia 12/25/2014  . Fibrositis 12/25/2014  . Headache, migraine 12/25/2014  . Osteopenia 12/25/2014  . Avitaminosis D 12/25/2014  . 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 Personal history of TIA (transient ischemic attack) 09/01/2009  . 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 Acquired hypothyroidism 04/25/2009  . Hypercholesteremia 02/20/2007  . 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 Apnea, sleep 12/02/2006   Social History   Tobacco Use  . Smoking status: Never Smoker  . Smokeless tobacco: Never Used  Vaping Use  . Vaping Use: Never used  Substance Use Topics  . Alcohol use: No  . Drug use: No   Allergies  Allergen Reactions  . Antihistamines, Chlorpheniramine-Type     Facial swelling   . Macrobid [Nitrofurantoin Macrocrystal]     unknown  . Statins     Muscle Pain, Weakness        Medications: Outpatient Medications Prior to Visit  Medication Sig  . Blood Glucose Monitoring Suppl ([place]) w/Device KIT New kit with device to check blood sugar once daily  . Cholecalciferol (VITAMIN D) 2000  UNITS CAPS Take 2,000 Units by mouth 2 (two) times daily.   . Lancets (ONETOUCH DELICA PLUS [id number]) MISC CHECK BLOOD SUGAR TWICE DAILY AS DIRECTED  . Omega-3 Fatty Acids (FISH OIL) 1000 MG CAPS Take 1,000-2,000 mg by mouth at bedtime.   Glory Rosebush VERIO test strip CHECK BLOOD SUGAR TWICE DAILY  . [DISCONTINUED] metFORMIN (GLUCOPHAGE)  500 MG tablet Take 1 tablet (500 mg total) by mouth 2 (two) times daily with a meal.   No facility-administered medications prior to visit.    Review of Systems  Constitutional: Negative.   Respiratory: Negative.   Cardiovascular: Negative.   Gastrointestinal: Positive for diarrhea.  Endocrine: Negative for polydipsia, polyphagia and polyuria.  Genitourinary: Positive for frequency.  Neurological: Negative.     Last CBC Lab Results  Component Value Date   WBC 8.2 04/18/2020   HGB 13.8 04/18/2020   HCT 41.7 04/18/2020   MCV 95 04/18/2020   MCH 31.6 04/18/2020   RDW 12.4 04/18/2020   PLT 242 71/69/6789   Last metabolic panel Lab Results  Component Value Date   GLUCOSE 135 (H) 04/18/2020   NA 140 04/18/2020   K 4.1 04/18/2020   CL 102 04/18/2020   CO2 22 04/18/2020   BUN 16 04/18/2020   CREATININE 0.65 04/18/2020   GFRNONAA 88 04/18/2020   GFRAA 101 04/18/2020   CALCIUM 9.9 04/18/2020   PROT 6.9 04/18/2020   ALBUMIN 4.5 04/18/2020   LABGLOB 2.4 04/18/2020   AGRATIO 1.9 04/18/2020   BILITOT <0.2 04/18/2020   ALKPHOS 89 04/18/2020   AST 14 04/18/2020   ALT 14 04/18/2020   ANIONGAP 9 11/04/2018      Objective    BP 113/60 (BP Location: Left Arm, Patient Position: Sitting, Cuff Size: Large)   Pulse 72   Temp 98.6 F (37 C) (Oral)   Resp 16   Wt 165 lb 9.6 oz (75.1 kg)   BMI 27.56 kg/m  BP Readings from Last 3 Encounters:  07/21/20 113/60  06/03/20 133/78  04/18/20 121/71   Wt Readings from Last 3 Encounters:  07/21/20 165 lb 9.6 oz (75.1 kg)  06/03/20 165 lb (74.8 kg)  04/18/20 164 lb 3.2 oz (74.5 kg)      Physical Exam Vitals reviewed.  Constitutional:      General: She is not in acute distress.    Appearance: Normal appearance. She is well-developed, well-groomed and overweight. She is not diaphoretic.  Neck:     Trachea: No tracheal deviation.  Cardiovascular:     Rate and Rhythm: Normal rate and regular rhythm.     Pulses: Normal pulses.      Heart sounds: Normal heart sounds. No murmur heard.  No friction rub. No gallop.   Pulmonary:     Effort: Pulmonary effort is normal. No respiratory distress.     Breath sounds: Normal breath sounds. No wheezing or rales.  Musculoskeletal:     Cervical back: Normal range of motion and neck supple.  Neurological:     Mental Status: She is alert.  Psychiatric:        Behavior: Behavior is cooperative.      Results for orders placed or performed in visit on 07/21/20  POCT glycosylated hemoglobin (Hb A1C)  Result Value Ref Range   Hemoglobin A1C 7.1 (A) 4.0 - 5.6 %   Est. average glucose Bld gHb Est-mCnc 157     Assessment & Plan     1. Controlled type 2 diabetes mellitus without complication, without long-term  current use of insulin (HCC) A1c is doing well and decreased to 7.1 from 7.2. Patient does report loose stools with metformin. Will stop Metformin and start Farxiga as below. She is to call if expensive or if the urine frequency is significant and will change to Glipizide instead. She agrees. If tolerating ok to f/u in 3 months.  - POCT glycosylated hemoglobin (Hb A1C) - dapagliflozin propanediol (FARXIGA) 10 MG TABS tablet; Take 1 tablet (10 mg total) by mouth daily before breakfast.  Dispense: 30 tablet; Refill: 0  2. 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 Willing to try Vascepa as below for better control of cholesterol.  - icosapent Ethyl (VASCEPA) 1 g capsule; Take 1 capsule (1 g total) by mouth 2 (two) times daily.  Dispense: 60 capsule; Refill: 1  3. Hypercholesteremia Intolerant to statins. Will try Vascepa.  - icosapent Ethyl (VASCEPA) 1 g capsule; Take 1 capsule (1 g total) by mouth 2 (two) times daily.  Dispense: 60 capsule; Refill: 1   Return in about 3 months (around 10/21/2020).      Reynolds Bowl, PA-C, have reviewed all documentation for this visit. The documentation on 07/23/20 for the exam, diagnosis, procedures, and orders are all  accurate and complete.    Rubye Beach  [facility] [phone] (phone) [phone] (fax)  [place]

## 2020-07-23 ENCOUNTER — Encounter: Payer: Self-pay | Admitting: Physician Assistant

## 2020-08-05 ENCOUNTER — Encounter: Payer: Self-pay | Admitting: Physician Assistant

## 2020-08-05 DIAGNOSIS — B373 Candidiasis of vulva and vagina: Secondary | ICD-10-CM

## 2020-08-05 DIAGNOSIS — B3731 Acute candidiasis of vulva and vagina: Secondary | ICD-10-CM

## 2020-08-05 DIAGNOSIS — E119 Type 2 diabetes mellitus without complications: Secondary | ICD-10-CM

## 2020-08-05 MED ORDER — FLUCONAZOLE 150 MG PO TABS: 150.0000 mg | ORAL_TABLET | Freq: Once | ORAL | 0 refills | Status: AC

## 2020-08-05 MED ORDER — FLUCONAZOLE 150 MG PO TABS: 150.0000 mg | ORAL_TABLET | Freq: Once | ORAL | 0 refills | Status: DC

## 2020-08-19 ENCOUNTER — Other Ambulatory Visit: Payer: Self-pay

## 2020-08-19 ENCOUNTER — Ambulatory Visit: Payer: Medicare HMO | Admitting: Podiatry

## 2020-08-19 DIAGNOSIS — L84 Corns and callosities: Secondary | ICD-10-CM

## 2020-08-19 DIAGNOSIS — M2011 Hallux valgus (acquired), right foot: Secondary | ICD-10-CM | POA: Diagnosis not present

## 2020-08-19 DIAGNOSIS — M2012 Hallux valgus (acquired), left foot: Secondary | ICD-10-CM

## 2020-08-19 NOTE — Progress Notes (Signed)
Subjective: Patient presents to the office today for chief complaint of painful callus lesions of the feet. Patient states that the pain is ongoing and is affecting their ability to ambulate without pain. Patient states that she has noticed the corn for approximately 2-3 months now. Patient presents today for further treatment and evaluation.  Objective:  Physical Exam General: Alert and oriented x3 in no acute distress  Dermatology: Hyperkeratotic lesion present on the medial aspect of the second digit left foot. Pain on palpation with a central nucleated core noted.  Skin is warm, dry and supple bilateral lower extremities. Negative for open lesions or macerations.  Vascular: Palpable pedal pulses bilaterally. No edema or erythema noted. Capillary refill within normal limits.  Neurological: Epicritic and protective threshold grossly intact bilaterally.   Musculoskeletal Exam: Pain on palpation at the keratotic lesion noted. Range of motion within normal limits bilateral. Muscle strength 5/5 in all groups bilateral. 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  #3 silicone toe cap dispensed #4 Patient is to return to the clinic PRN.   Nadalee Neiswender M. Lindwood Mogel, DPM [facility]   

## 2020-08-21 IMAGING — US US BREAST*R* LIMITED INC AXILLA
1 series · 7 of 7 positions shown · non-contrast
Comparison: Prior studies including 04/06/2018

CLINICAL DATA: Two year follow-up for probably benign minimally
complicated cyst in the RIGHT breast.

EXAM:
DIGITAL DIAGNOSTIC BILATERAL MAMMOGRAM WITH CAD AND TOMO
ULTRASOUND RIGHT BREAST

[Series 1: us breast*right* limited inc axilla · 0.06mm/px · 7 of 7 slices shown]
[im 1/7]
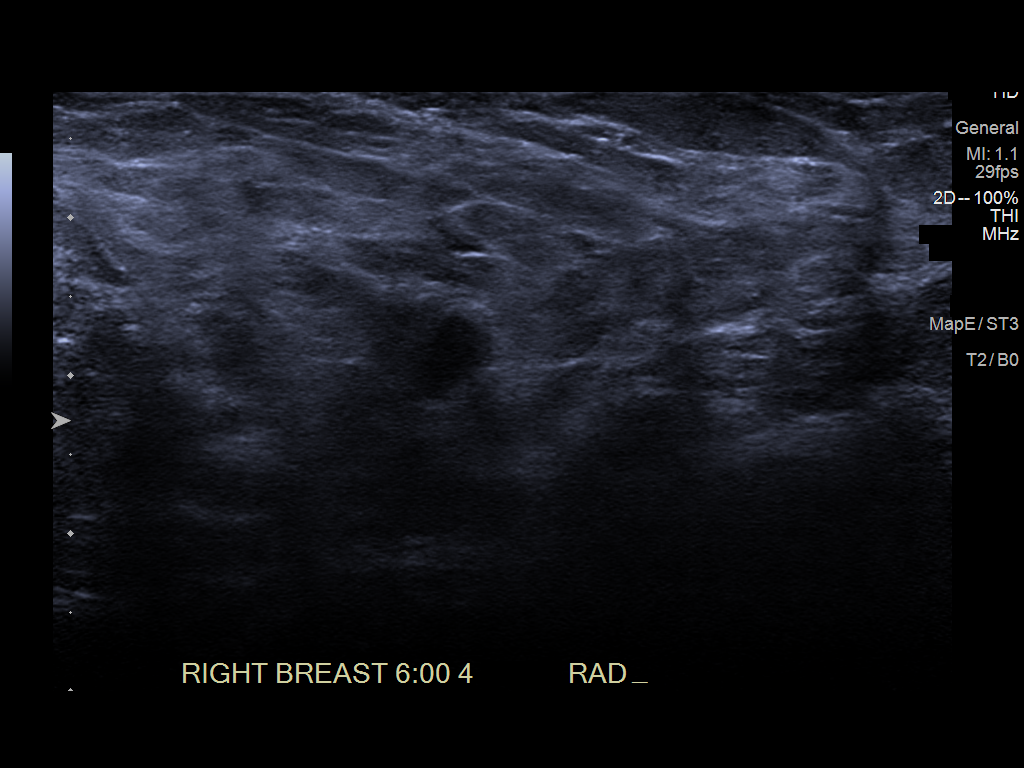
[im 2/7]
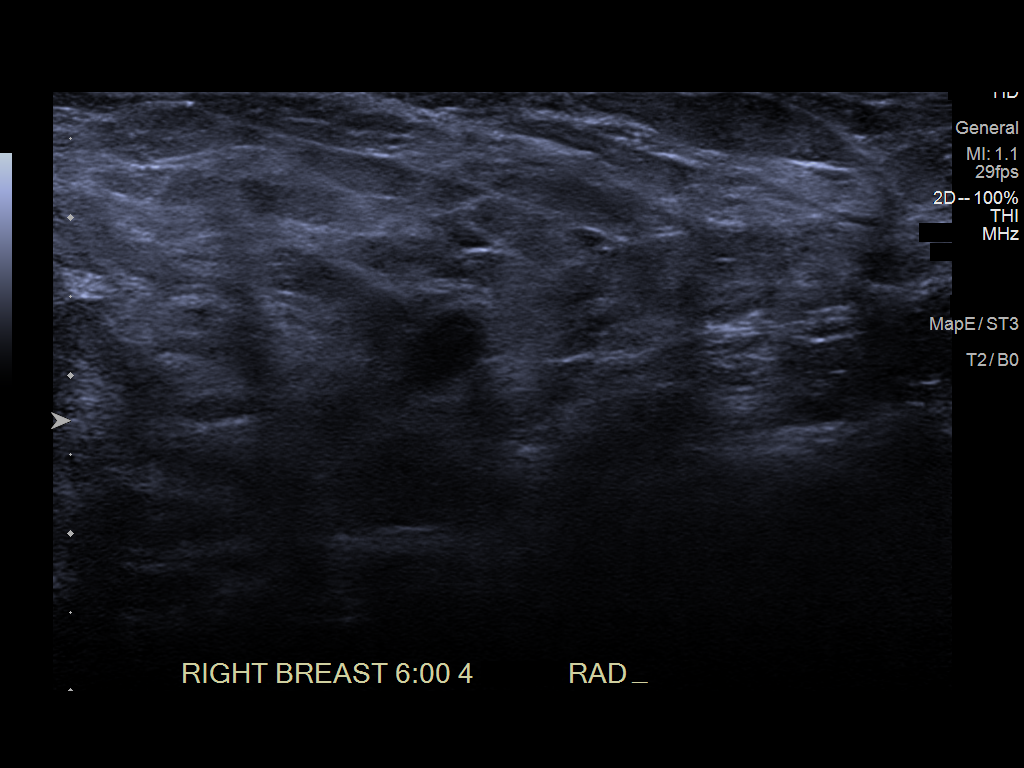
[im 3/7]
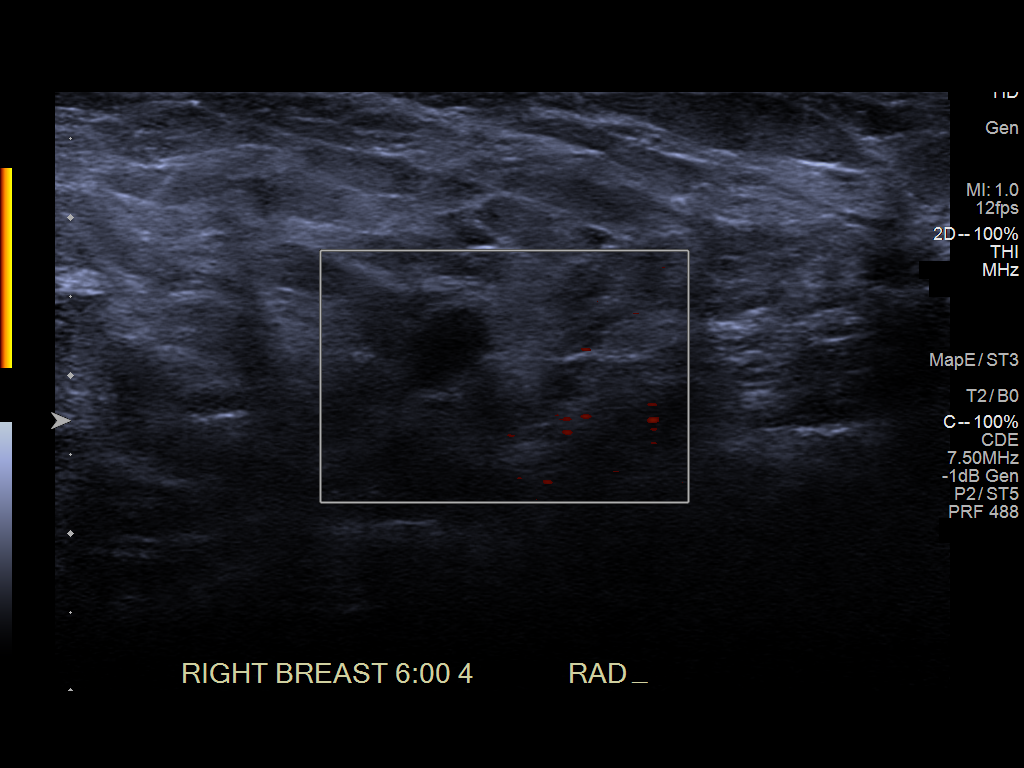
[im 4/7]
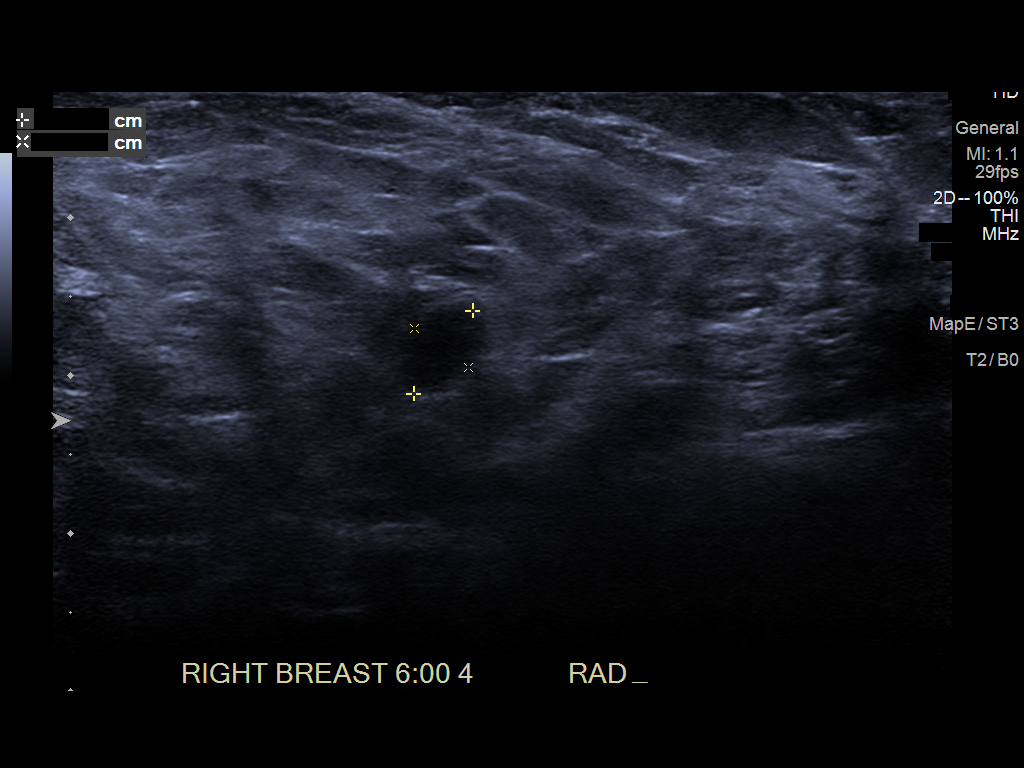
[im 5/7]
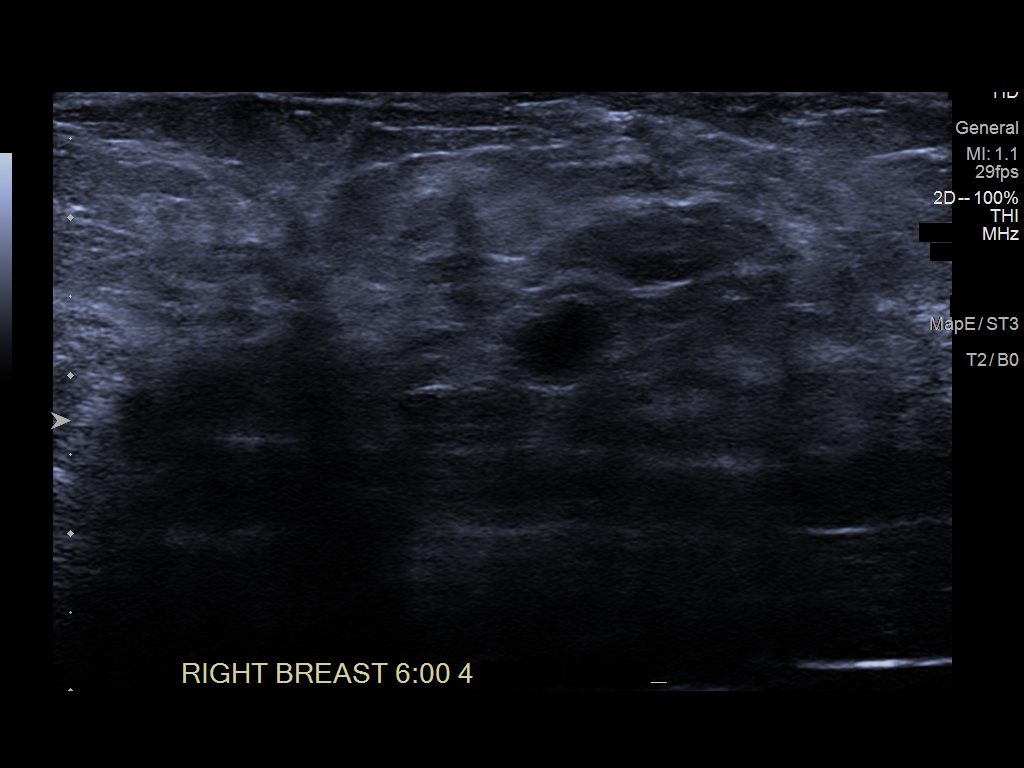
[im 6/7]
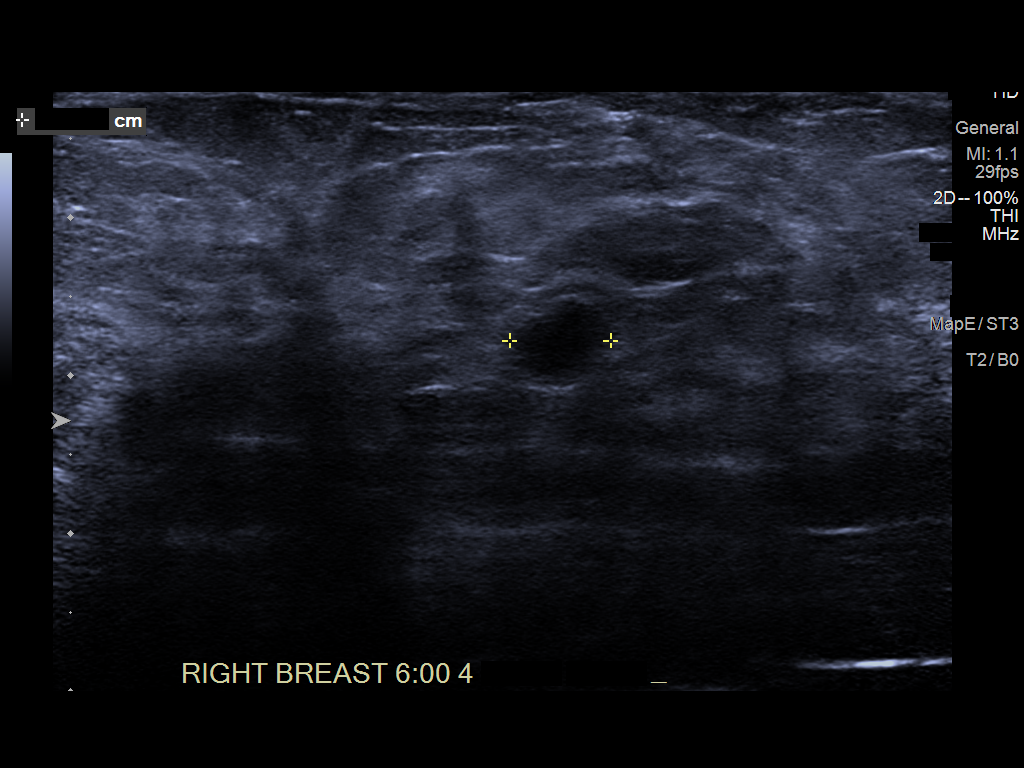
[im 7/7]
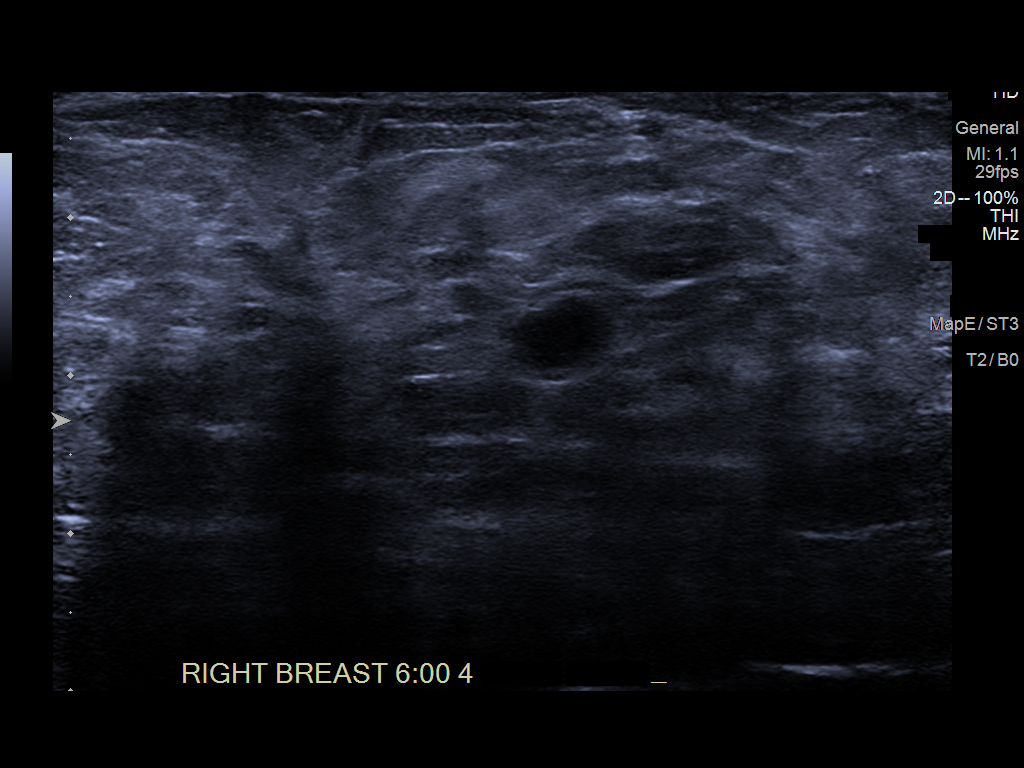

[7 of 7 positions shown; findings below may reference images not displayed]

ACR Breast Density Category c: The breast tissue is heterogeneously
dense, which may obscure small masses.
FINDINGS: Stable appearance of small circumscribed oval mass in the LOWER
central portion of the RIGHT breast, best imaged on the craniocaudal
projection. No new mass or distortion in either breast.

Mammographic images were processed with CAD.

Targeted ultrasound is performed, showing a near anechoic mass in
the 6 o'clock location of the RIGHT breast 4 centimeters from the
nipple measuring 0.6 x 0.4 x 0.6 centimeters. No associated internal
blood flow or solid component. The appearance has been stable since
multiple prior studies including 01/27/2017.
IMPRESSION: Stable RIGHT breast mass, consistent with benign process. No
mammographic or ultrasound evidence for malignancy.

RECOMMENDATION:
Screening mammogram in one year.(Code:L8-C-[facility])

I have discussed the findings and recommendations with the patient.
If applicable, a reminder letter will be sent to the patient
regarding the next appointment.

BI-RADS CATEGORY  2: Benign.

## 2020-08-27 MED ORDER — DAPAGLIFLOZIN PROPANEDIOL 10 MG PO TABS: 10.0000 mg | ORAL_TABLET | Freq: Every day | ORAL | 3 refills | Status: DC

## 2020-08-27 NOTE — Addendum Note (Signed)
Addended by: Margaretann Loveless on: 08/27/2020 12:25 PM   Modules accepted: Orders

## 2020-10-22 ENCOUNTER — Encounter: Payer: Self-pay | Admitting: Physician Assistant

## 2020-10-22 ENCOUNTER — Other Ambulatory Visit: Payer: Self-pay

## 2020-10-22 ENCOUNTER — Ambulatory Visit (INDEPENDENT_AMBULATORY_CARE_PROVIDER_SITE_OTHER): Payer: Medicare HMO | Admitting: Physician Assistant

## 2020-10-22 VITALS — BP 117/69 | HR 61 | Temp 98.5°F | Wt 160.0 lb

## 2020-10-22 DIAGNOSIS — T466X5A Adverse effect of antihyperlipidemic and antiarteriosclerotic drugs, initial encounter: Secondary | ICD-10-CM | POA: Diagnosis not present

## 2020-10-22 DIAGNOSIS — G720 Drug-induced myopathy: Secondary | ICD-10-CM | POA: Diagnosis not present

## 2020-10-22 DIAGNOSIS — E7800 Pure hypercholesterolemia, unspecified: Secondary | ICD-10-CM | POA: Diagnosis not present

## 2020-10-22 DIAGNOSIS — E119 Type 2 diabetes mellitus without complications: Secondary | ICD-10-CM

## 2020-10-22 LAB — POCT GLYCOSYLATED HEMOGLOBIN (HGB A1C): Hemoglobin A1C: 7.6 % — AB (ref 4.0–5.6)

## 2020-10-22 NOTE — Progress Notes (Signed)
Established patient visit   Patient: Christine Richards.   DOB: [birth year]   75 y.o. Female  MRN: [id number] Visit Date: 10/22/2020  Today's healthcare provider: Mar Daring, PA-C   Chief Complaint  Patient presents with  . Diabetes  . Hyperlipidemia   Subjective    HPI  Diabetes Mellitus Type II, follow-up  Lab Results  Component Value Date   HGBA1C 7.6 (A) 10/22/2020   HGBA1C 7.1 (A) 07/21/2020   HGBA1C 7.2 (H) 04/18/2020   Last seen for diabetes 3 months ago.  Management since then includes continuing the same treatment. She reports excellent compliance with treatment. She is not having side effects.   Home blood sugar records: fasting range: High 100's  Episodes of hypoglycemia? No    Current insulin regiment: None Most Recent Eye Exam: 05/08/2020  Lipid/Cholesterol, follow-up  Last Lipid Panel: Lab Results  Component Value Date   CHOL 365 (H) 04/18/2020   LDLCALC 240 (H) 04/18/2020   HDL 52 04/18/2020   TRIG 339 (H) 04/18/2020    She was last seen for this 3 months ago.  Management since that visit includes starting Vascepa.  She reports excellent compliance with treatment. She is having side effects. Some nasal congestion  Symptoms: No appetite changes No foot ulcerations  No chest pain No chest pressure/discomfort  No dyspnea No orthopnea  No fatigue No lower extremity edema  No palpitations No paroxysmal nocturnal dyspnea  No nausea No numbness or tingling of extremity  Yes polydipsia Yes polyuria  No speech difficulty No syncope   She is following a Regular diet. Current exercise: walking and weightlifting  Last metabolic panel Lab Results  Component Value Date   GLUCOSE 135 (H) 04/18/2020   NA 140 04/18/2020   K 4.1 04/18/2020   BUN 16 04/18/2020   CREATININE 0.65 04/18/2020   GFRNONAA 88 04/18/2020   GFRAA 101 04/18/2020   CALCIUM 9.9 04/18/2020   AST 14 04/18/2020   ALT 14 04/18/2020   The ASCVD Risk score (Goff DC  Jr., et al., 2013) failed to calculate for the following reasons:   The valid total cholesterol range is 130 to 320 mg/dL  ---------------------------------------------------------------------------------------------------   Patient Active Problem List   Diagnosis Date Noted  . 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 Cystocele with prolapse 03/07/2019  . Heartburn   . Gastritis   . Situational anxiety 11/20/2015  . Arthritis 02/11/2015  . Allergic rhinitis 12/25/2014  . Aneurysm ([place]) 12/25/2014  . Carotid stenosis 12/25/2014  . Controlled diabetes mellitus type II without complication ([place]) 27/74/1287  . Aerophobia 12/25/2014  . Fibrositis 12/25/2014  . Headache, migraine 12/25/2014  . Osteopenia 12/25/2014  . Avitaminosis D 12/25/2014  . 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 Personal history of TIA (transient ischemic attack) 09/01/2009  . 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 Acquired hypothyroidism 04/25/2009  . Hypercholesteremia 02/20/2007  . 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 Apnea, sleep 12/02/2006   Past Medical History:  Diagnosis Date  . Arthritis    rheumatoid  . Diabetes mellitus without complication ([place])   . Diverticulosis   . Environmental and seasonal allergies   . GERD (gastroesophageal reflux disease)   . Hyperlipidemia   . Osteopenia   . Sinus congestion   . Sleep apnea    uses CPAP  . Stroke [facility])    TIA 6 years ago no issues since  . TIA (transient ischemic attack)    Social History  Tobacco Use  . Smoking status: Never Smoker  . Smokeless tobacco: Never Used  Vaping Use  . Vaping Use: Never used  Substance Use Topics  . Alcohol use: No  . Drug use: No   Allergies  Allergen Reactions  . Antihistamines, Chlorpheniramine-Type     Facial swelling   . Macrobid [Nitrofurantoin Macrocrystal]     unknown  . Statins     Muscle Pain, Weakness      Medications: Outpatient Medications Prior to Visit   Medication Sig  . Blood Glucose Monitoring Suppl ([place]) w/Device KIT New kit with device to check blood sugar once daily  . Cholecalciferol (VITAMIN D) 2000 UNITS CAPS Take 2,000 Units by mouth 2 (two) times daily.   . dapagliflozin propanediol (FARXIGA) 10 MG TABS tablet Take 1 tablet (10 mg total) by mouth daily before breakfast.  . Lancets (ONETOUCH DELICA PLUS [id number]) MISC CHECK BLOOD SUGAR TWICE DAILY AS DIRECTED  . Omega-3 Fatty Acids (FISH OIL) 1000 MG CAPS Take 1,000-2,000 mg by mouth at bedtime.   Glory Rosebush VERIO test strip CHECK BLOOD SUGAR TWICE DAILY  . icosapent Ethyl (VASCEPA) 1 g capsule Take 1 capsule (1 g total) by mouth 2 (two) times daily. (Patient not taking: Reported on 10/22/2020)   No facility-administered medications prior to visit.    Review of Systems  Constitutional: Negative.   HENT: Positive for rhinorrhea.   Respiratory: Negative.   Cardiovascular: Negative.   Gastrointestinal: Negative.   Endocrine: Positive for polydipsia and polyuria. Negative for cold intolerance, heat intolerance and polyphagia.  Skin: Negative for wound.  Neurological: Negative for dizziness, light-headedness and headaches.        Objective    BP 117/69 (BP Location: Left Arm, Patient Position: Sitting, Cuff Size: Normal)   Pulse 61   Temp 98.5 F (36.9 C) (Oral)   Wt 160 lb (72.6 kg)   BMI 26.63 kg/m    Physical Exam Vitals reviewed.  Constitutional:      General: She is not in acute distress.    Appearance: Normal appearance. She is well-developed, well-groomed, overweight and well-nourished. She is not ill-appearing or diaphoretic.  HENT:     Head: Normocephalic and atraumatic.  Cardiovascular:     Rate and Rhythm: Normal rate and regular rhythm.     Heart sounds: Normal heart sounds. No murmur heard. No friction rub. No gallop.   Pulmonary:     Effort: Pulmonary effort is normal. No respiratory distress.     Breath sounds: Normal  breath sounds. No wheezing or rales.  Musculoskeletal:     Cervical back: Normal range of motion and neck supple. No tenderness.  Neurological:     Mental Status: She is alert.  Psychiatric:        Behavior: Behavior is cooperative.      Results for orders placed or performed in visit on 10/22/20  POCT glycosylated hemoglobin (Hb A1C)  Result Value Ref Range   Hemoglobin A1C 7.6 (A) 4.0 - 5.6 %    Assessment & Plan     1. Controlled type 2 diabetes mellitus without complication, without long-term current use of insulin (HCC) A1c increased from 7.1 to 7.6. Will continue Farxiga 69m and work on lifestyle modifications. F/U in 3 months.  - POCT glycosylated hemoglobin (Hb A1C)  2. Hypercholesteremia Had vascepa prescribed but has not started. Will pick up from pharmacy. Intolerant to statins due to myopathy.  3. Statin myopathy See above medical treatment plan.  No follow-ups on file.      Reynolds Bowl, PA-C, have reviewed all documentation for this visit. The documentation on 10/22/20 for the exam, diagnosis, procedures, and orders are all accurate and complete.   Rubye Beach  [facility] [phone] (phone) [phone] (fax)  [place]

## 2020-11-12 DIAGNOSIS — G4733 Obstructive sleep apnea (adult) (pediatric): Secondary | ICD-10-CM | POA: Diagnosis not present

## 2020-12-01 ENCOUNTER — Ambulatory Visit (INDEPENDENT_AMBULATORY_CARE_PROVIDER_SITE_OTHER): Payer: Medicare HMO | Admitting: Nurse Practitioner

## 2020-12-01 ENCOUNTER — Encounter (INDEPENDENT_AMBULATORY_CARE_PROVIDER_SITE_OTHER): Payer: Medicare HMO

## 2021-01-20 ENCOUNTER — Encounter: Payer: Self-pay | Admitting: Family Medicine

## 2021-01-20 ENCOUNTER — Other Ambulatory Visit: Payer: Self-pay

## 2021-01-20 ENCOUNTER — Ambulatory Visit (INDEPENDENT_AMBULATORY_CARE_PROVIDER_SITE_OTHER): Payer: Medicare HMO | Admitting: Family Medicine

## 2021-01-20 VITALS — BP 120/63 | HR 74 | Temp 98.2°F | Resp 16 | Ht 65.0 in | Wt 160.0 lb

## 2021-01-20 DIAGNOSIS — E559 Vitamin D deficiency, unspecified: Secondary | ICD-10-CM

## 2021-01-20 DIAGNOSIS — E1169 Type 2 diabetes mellitus with other specified complication: Secondary | ICD-10-CM

## 2021-01-20 DIAGNOSIS — Z8673 Personal history of transient ischemic attack (TIA), and cerebral infarction without residual deficits: Secondary | ICD-10-CM

## 2021-01-20 DIAGNOSIS — H60543 Acute eczematoid otitis externa, bilateral: Secondary | ICD-10-CM | POA: Insufficient documentation

## 2021-01-20 DIAGNOSIS — E1165 Type 2 diabetes mellitus with hyperglycemia: Secondary | ICD-10-CM

## 2021-01-20 DIAGNOSIS — G720 Drug-induced myopathy: Secondary | ICD-10-CM

## 2021-01-20 DIAGNOSIS — E785 Hyperlipidemia, unspecified: Secondary | ICD-10-CM

## 2021-01-20 LAB — POCT GLYCOSYLATED HEMOGLOBIN (HGB A1C)
Est. average glucose Bld gHb Est-mCnc: 171
Hemoglobin A1C: 7.6 % — AB (ref 4.0–5.6)

## 2021-01-20 MED ORDER — SITAGLIPTIN PHOSPHATE 25 MG PO TABS: 25.0000 mg | ORAL_TABLET | Freq: Every day | ORAL | 2 refills | Status: DC

## 2021-01-20 NOTE — Assessment & Plan Note (Signed)
Continue risk factor management. 

## 2021-01-20 NOTE — Progress Notes (Signed)
Established patient visit   Patient: Christine Richards.   DOB: 1945/09/25   75 y.o. Female  MRN: [id number] Visit Date: 01/20/2021  Today's healthcare provider: Lavon Paganini, MD   Chief Complaint  Patient presents with  . Diabetes   Subjective    Diabetes Pertinent negatives for hypoglycemia include no dizziness or headaches. Pertinent negatives for diabetes include no chest pain, no fatigue and no weakness.  She is interested in changing the [place] because it's no longer working for her and she currently pays $47 a month. She has been on the  [place] for 6 to 9 months and before that she was on Metformin. She began experiencing frequent bowel movements and bloating. She limits herself to dessert 1-2x per week and she limits carbs from her diet. At home her blood glucose levels are in the 170s upon checking 1-2x a week.   Dermatitis  She reports excessive itching and flaking in her ears and around her face. She believes its associated with the CPAP. She is using Tacrolimus on her skin around her nose. She She denies the dermatologist prescribed her topical ointment for the itching and flaking of her because these symptoms are new.    TIA In the past she had an TIA associated with stenosis of carotid artey. She was prescribed statin to lower her cholesterol however she no longer uses it because she was having muscle aches. She reports trying multiple cholesterol medications and she had complications.   Diabetes Mellitus Type II, Follow-up  Lab Results  Component Value Date   HGBA1C 7.6 (A) 01/20/2021   HGBA1C 7.6 (A) 10/22/2020   HGBA1C 7.1 (A) 07/21/2020   Wt Readings from Last 3 Encounters:  01/20/21 160 lb (72.6 kg)  10/22/20 160 lb (72.6 kg)  07/21/20 165 lb 9.6 oz (75.1 kg)   Last seen for diabetes 3 months ago.  Management since then includes no changes. She reports excellent compliance with treatment. She is not having side effects. Symptoms: No fatigue No  foot ulcerations  No appetite changes No nausea  No paresthesia of the feet  No polydipsia  No polyuria No visual disturbances   No vomiting     Home blood sugar records: fasting range: 160's  Episodes of hypoglycemia? No   Current insulin regiment: none Most Recent Eye Exam: UTD Current exercise: aerobics Current diet habits: in general, a "healthy" diet    Pertinent Labs: Lab Results  Component Value Date   CHOL 365 (H) 04/18/2020   HDL 52 04/18/2020   LDLCALC 240 (H) 04/18/2020   TRIG 339 (H) 04/18/2020   CHOLHDL 7.0 (H) 04/18/2020   Lab Results  Component Value Date   NA 140 04/18/2020   K 4.1 04/18/2020   CREATININE 0.65 04/18/2020   GFRNONAA 88 04/18/2020   GFRAA 101 04/18/2020   GLUCOSE 135 (H) 04/18/2020     ---------------------------------------------------------------------------------------------------   Patient Active Problem List   Diagnosis Date Noted  . Dermatitis of ear canal, bilateral 01/20/2021  . 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 Cystocele with prolapse 03/07/2019  . Heartburn   . Gastritis   . Situational anxiety 11/20/2015  . Arthritis 02/11/2015  . Allergic rhinitis 12/25/2014  . Aneurysm ([place]) 12/25/2014  . Carotid stenosis 12/25/2014  . T2DM (type 2 diabetes mellitus) ([place]) 12/25/2014  . Aerophobia 12/25/2014  . Fibrositis 12/25/2014  . Headache, migraine 12/25/2014  . Osteopenia 12/25/2014  . Avitaminosis D 12/25/2014  . Occlusion and stenosis of multiple and bilateral  precerebral arteries 09/12/2009  . Personal history of TIA (transient ischemic attack) 09/01/2009  . 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 Hyperlipidemia associated with type 2 diabetes mellitus ([place]) 02/20/2007  . 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 Apnea, sleep 12/02/2006   Social History   Tobacco Use  . Smoking status: Never Smoker  . Smokeless tobacco: Never Used  Vaping Use  . Vaping Use: Never used  Substance Use Topics  . Alcohol use: No  . Drug use:  No   Allergies  Allergen Reactions  . Antihistamines, Chlorpheniramine-Type     Facial swelling   . Macrobid [Nitrofurantoin Macrocrystal]     unknown  . Statins     Muscle Pain, Weakness        Medications: Outpatient Medications Prior to Visit  Medication Sig  . Blood Glucose Monitoring Suppl ([place]) w/Device KIT New kit with device to check blood sugar once daily  . Cholecalciferol (VITAMIN D) 2000 UNITS CAPS Take 2,000 Units by mouth 2 (two) times daily.   . dapagliflozin propanediol (FARXIGA) 10 MG TABS tablet Take 1 tablet (10 mg total) by mouth daily before breakfast.  . Lancets (ONETOUCH DELICA PLUS [id number]) MISC CHECK BLOOD SUGAR TWICE DAILY AS DIRECTED  . Omega-3 Fatty Acids (FISH OIL) 1000 MG CAPS Take 1,000-2,000 mg by mouth at bedtime.   Glory Rosebush VERIO test strip CHECK BLOOD SUGAR TWICE DAILY   No facility-administered medications prior to visit.    Review of Systems  Constitutional: Negative for activity change, appetite change, chills, fatigue and fever.  HENT: Negative for ear pain, nosebleeds, sinus pressure, sinus pain and sore throat.   Eyes: Negative for pain.  Respiratory: Negative for cough, chest tightness, shortness of breath and wheezing.   Cardiovascular: Negative for chest pain, palpitations and leg swelling.  Gastrointestinal: Negative for abdominal distention, abdominal pain, blood in stool, diarrhea, nausea and vomiting.  Genitourinary: Negative for flank pain, frequency, pelvic pain and urgency.  Musculoskeletal: Negative for back pain, neck pain and neck stiffness.  Neurological: Negative for dizziness, syncope, weakness, light-headedness, numbness and headaches.       Objective    BP 120/63 (BP Location: Right Arm, Patient Position: Sitting, Cuff Size: Normal)   Pulse 74   Temp 98.2 F (36.8 C) (Oral)   Resp 16   Ht _0  (1.651 m)   Wt 160 lb (72.6 kg)   SpO2 97%   BMI 26.63 kg/m  BP Readings from Last 3  Encounters:  01/20/21 120/63  10/22/20 117/69  07/21/20 113/60   Wt Readings from Last 3 Encounters:  01/20/21 160 lb (72.6 kg)  10/22/20 160 lb (72.6 kg)  07/21/20 165 lb 9.6 oz (75.1 kg)       Physical Exam Vitals reviewed.  Constitutional:      General: She is not in acute distress.    Appearance: Normal appearance. She is well-developed. She is not diaphoretic.  HENT:     Head: Normocephalic and atraumatic.     Ears:     Comments: External ear flaky skin left worse than right Eyes:     General: No scleral icterus.    Conjunctiva/sclera: Conjunctivae normal.  Neck:     Thyroid: No thyromegaly.  Cardiovascular:     Rate and Rhythm: Normal rate and regular rhythm.     Pulses: Normal pulses.     Heart sounds: Normal heart sounds. No murmur heard.   Pulmonary:     Effort: Pulmonary effort is normal. No respiratory distress.  Breath sounds: Normal breath sounds. No wheezing, rhonchi or rales.  Musculoskeletal:     Cervical back: Neck supple.     Right lower leg: No edema.     Left lower leg: No edema.  Lymphadenopathy:     Cervical: No cervical adenopathy.  Skin:    General: Skin is warm and dry.     Findings: No rash.  Neurological:     Mental Status: She is alert and oriented to person, place, and time. Mental status is at baseline.  Psychiatric:        Mood and Affect: Mood normal.        Behavior: Behavior normal.     Results for orders placed or performed in visit on 01/20/21  POCT glycosylated hemoglobin (Hb A1C)  Result Value Ref Range   Hemoglobin A1C 7.6 (A) 4.0 - 5.6 %   Est. average glucose Bld gHb Est-mCnc 171     Assessment & Plan     Problem List Items Addressed This Visit      Endocrine   T2DM (type 2 diabetes mellitus) ([place]) - Primary    Uncontrolled with hyperglycemia with A1c 7.6 Discussed goal A1c less than 7 Intolerant to metformin Continue Farxiga at current dose Discussed adding a second medication and agreed to Januvia 25 mg  daily We will connect her to [facility] pharmacist for medication assistance Discussed diet and exercise Not on statin due to myalgias Follow-up in 3 months and recheck A1c      Relevant Medications   sitaGLIPtin (JANUVIA) 25 MG tablet   Hyperlipidemia associated with type 2 diabetes mellitus ([place])    Intolerant to statins Discussed importance of hyperlipidemia control Could consider PCSK9 in the future Recheck metabolic panel and lipids      Relevant Medications   sitaGLIPtin (JANUVIA) 25 MG tablet   Other Relevant Orders   Comprehensive metabolic panel   Lipid panel     Nervous and Auditory   Dermatitis of ear canal, bilateral    OTC hydrocortisone cream twice daily as needed        Musculoskeletal and Integument   Drug-induced myopathy    Statin intolerant due to myalgias        Other   Personal history of TIA (transient ischemic attack)    Continue risk factor management      Avitaminosis D   Relevant Orders   VITAMIN D 25 Hydroxy (Vit-D Deficiency, Fractures)       Return in about 3 months (around 04/22/2021) for chronic disease f/u.       I,Essence Turner,acting as a Education administrator for Lavon Paganini, MD.,have documented all relevant documentation on the behalf of Lavon Paganini, MD,as directed by  Lavon Paganini, MD while in the presence of Lavon Paganini, MD.  I, Lavon Paganini, MD, have reviewed all documentation for this visit. The documentation on 01/20/21 for the exam, diagnosis, procedures, and orders are all accurate and complete.   Zeanna Sunde, Dionne Bucy, MD, MPH [facility]

## 2021-01-20 NOTE — Assessment & Plan Note (Signed)
Statin intolerant due to myalgias

## 2021-01-20 NOTE — Assessment & Plan Note (Signed)
Uncontrolled with hyperglycemia with A1c 7.6 Discussed goal A1c less than 7 Intolerant to metformin Continue Farxiga at current dose Discussed adding a second medication and agreed to Januvia 25 mg daily We will connect her to [facility] pharmacist for medication assistance Discussed diet and exercise Not on statin due to myalgias Follow-up in 3 months and recheck A1c

## 2021-01-20 NOTE — Assessment & Plan Note (Signed)
Intolerant to statins Discussed importance of hyperlipidemia control Could consider PCSK9 in the future Recheck metabolic panel and lipids

## 2021-01-20 NOTE — Patient Instructions (Signed)
Diabetes Mellitus and Nutrition, Adult When you have diabetes, or diabetes mellitus, it is very important to have healthy eating habits because your blood sugar (glucose) levels are greatly affected by what you eat and drink. Eating healthy foods in the right amounts, at about the same times every day, can help you:  Control your blood glucose.  Lower your risk of heart disease.  Improve your blood pressure.  Reach or maintain a healthy weight. What can affect my meal plan? Every person with diabetes is different, and each person has different needs for a meal plan. Your health care provider may recommend that you work with a dietitian to make a meal plan that is best for you. Your meal plan may vary depending on factors such as:  The calories you need.  The medicines you take.  Your weight.  Your blood glucose, blood pressure, and cholesterol levels.  Your activity level.  Other health conditions you have, such as heart or kidney disease. How do carbohydrates affect me? Carbohydrates, also called carbs, affect your blood glucose level more than any other type of food. Eating carbs naturally raises the amount of glucose in your blood. Carb counting is a method for keeping track of how many carbs you eat. Counting carbs is important to keep your blood glucose at a healthy level, especially if you use insulin or take certain oral diabetes medicines. It is important to know how many carbs you can safely have in each meal. This is different for every person. Your dietitian can help you calculate how many carbs you should have at each meal and for each snack. How does alcohol affect me? Alcohol can cause a sudden decrease in blood glucose (hypoglycemia), especially if you use insulin or take certain oral diabetes medicines. Hypoglycemia can be a life-threatening condition. Symptoms of hypoglycemia, such as sleepiness, dizziness, and confusion, are similar to symptoms of having too much  alcohol.  Do not drink alcohol if: ? Your health care provider tells you not to drink. ? You are pregnant, may be pregnant, or are planning to become pregnant.  If you drink alcohol: ? Do not drink on an empty stomach. ? Limit how much you use to:  0-1 drink a day for women.  0-2 drinks a day for men. ? Be aware of how much alcohol is in your drink. In the [place] one drink equals one 12 oz bottle of beer (355 mL), one 5 oz glass of wine (148 mL), or one 1 oz glass of hard liquor (44 mL). ? Keep yourself hydrated with water, diet soda, or unsweetened iced tea.  Keep in mind that regular soda, juice, and other mixers may contain a lot of sugar and must be counted as carbs. What are tips for following this plan? Reading food labels  Start by checking the serving size on the "Nutrition Facts" label of packaged foods and drinks. The amount of calories, carbs, fats, and other nutrients listed on the label is based on one serving of the item. Many items contain more than one serving per package.  Check the total grams (g) of carbs in one serving. You can calculate the number of servings of carbs in one serving by dividing the total carbs by 15. For example, if a food has 30 g of total carbs per serving, it would be equal to 2 servings of carbs.  Check the number of grams (g) of saturated fats and trans fats in one serving. Choose foods that have   a low amount or none of these fats.  Check the number of milligrams (mg) of salt (sodium) in one serving. Most people should limit total sodium intake to less than 2,300 mg per day.  Always check the nutrition information of foods labeled as "low-fat" or "nonfat." These foods may be higher in added sugar or refined carbs and should be avoided.  Talk to your dietitian to identify your daily goals for nutrients listed on the label. Shopping  Avoid buying canned, pre-made, or processed foods. These foods tend to be high in fat, sodium, and added  sugar.  Shop around the outside edge of the grocery store. This is where you will most often find fresh fruits and vegetables, bulk grains, fresh meats, and fresh dairy. Cooking  Use low-heat cooking methods, such as baking, instead of high-heat cooking methods like deep frying.  Cook using healthy oils, such as olive, canola, or sunflower oil.  Avoid cooking with butter, cream, or high-fat meats. Meal planning  Eat meals and snacks regularly, preferably at the same times every day. Avoid going long periods of time without eating.  Eat foods that are high in fiber, such as fresh fruits, vegetables, beans, and whole grains. Talk with your dietitian about how many servings of carbs you can eat at each meal.  Eat 4-6 oz (112-168 g) of lean protein each day, such as lean meat, chicken, fish, eggs, or tofu. One ounce (oz) of lean protein is equal to: ? 1 oz (28 g) of meat, chicken, or fish. ? 1 egg. ?  cup (62 g) of tofu.  Eat some foods each day that contain healthy fats, such as avocado, nuts, seeds, and fish.   What foods should I eat? Fruits Berries. Apples. Oranges. Peaches. Apricots. Plums. Grapes. Mango. Papaya. Pomegranate. Kiwi. Cherries. Vegetables Lettuce. Spinach. Leafy greens, including kale, chard, collard greens, and mustard greens. Beets. Cauliflower. Cabbage. Broccoli. Carrots. Green beans. Tomatoes. Peppers. Onions. Cucumbers. Brussels sprouts. Grains Whole grains, such as whole-wheat or whole-grain bread, crackers, tortillas, cereal, and pasta. Unsweetened oatmeal. Quinoa. Brown or wild rice. Meats and other proteins Seafood. Poultry without skin. Lean cuts of poultry and beef. Tofu. Nuts. Seeds. Dairy Low-fat or fat-free dairy products such as milk, yogurt, and cheese. The items listed above may not be a complete list of foods and beverages you can eat. Contact a dietitian for more information. What foods should I avoid? Fruits Fruits canned with  syrup. Vegetables Canned vegetables. Frozen vegetables with butter or cream sauce. Grains Refined white flour and flour products such as bread, pasta, snack foods, and cereals. Avoid all processed foods. Meats and other proteins Fatty cuts of meat. Poultry with skin. Breaded or fried meats. Processed meat. Avoid saturated fats. Dairy Full-fat yogurt, cheese, or milk. Beverages Sweetened drinks, such as soda or iced tea. The items listed above may not be a complete list of foods and beverages you should avoid. Contact a dietitian for more information. Questions to ask a health care provider  Do I need to meet with a diabetes educator?  Do I need to meet with a dietitian?  What number can I call if I have questions?  When are the best times to check my blood glucose? Where to find more information:  American Diabetes Association: diabetes.org  Academy of Nutrition and Dietetics: www.eatright.org  National Institute of Diabetes and Digestive and Kidney Diseases: www.niddk.nih.gov  Association of Diabetes Care and Education Specialists: www.diabeteseducator.org Summary  It is important to have healthy eating   habits because your blood sugar (glucose) levels are greatly affected by what you eat and drink.  A healthy meal plan will help you control your blood glucose and maintain a healthy lifestyle.  Your health care provider may recommend that you work with a dietitian to make a meal plan that is best for you.  Keep in mind that carbohydrates (carbs) and alcohol have immediate effects on your blood glucose levels. It is important to count carbs and to use alcohol carefully. This information is not intended to replace advice given to you by your health care provider. Make sure you discuss any questions you have with your health care provider. Document Revised: 07/24/2019 Document Reviewed: 07/24/2019 Elsevier Patient Education  2021 Elsevier Inc.  

## 2021-01-20 NOTE — Assessment & Plan Note (Signed)
OTC hydrocortisone cream twice daily as needed

## 2021-01-21 ENCOUNTER — Telehealth: Payer: Self-pay | Admitting: *Deleted

## 2021-01-21 LAB — COMPREHENSIVE METABOLIC PANEL
ALT: 16 IU/L (ref 0–32)
AST: 15 IU/L (ref 0–40)
Albumin/Globulin Ratio: 2.1 (ref 1.2–2.2)
Albumin: 4.6 g/dL (ref 3.7–4.7)
Alkaline Phosphatase: 95 IU/L (ref 44–121)
BUN/Creatinine Ratio: 28 (ref 12–28)
BUN: 21 mg/dL (ref 8–27)
Bilirubin Total: 0.4 mg/dL (ref 0.0–1.2)
CO2: 22 mmol/L (ref 20–29)
Calcium: 9.5 mg/dL (ref 8.7–10.3)
Chloride: 103 mmol/L (ref 96–106)
Creatinine, Ser: 0.76 mg/dL (ref 0.57–1.00)
Globulin, Total: 2.2 g/dL (ref 1.5–4.5)
Glucose: 146 mg/dL — ABNORMAL HIGH (ref 65–99)
Potassium: 4.1 mmol/L (ref 3.5–5.2)
Sodium: 144 mmol/L (ref 134–144)
Total Protein: 6.8 g/dL (ref 6.0–8.5)
eGFR: 82 mL/min/{1.73_m2} (ref >59)

## 2021-01-21 LAB — LIPID PANEL
Chol/HDL Ratio: 6.6 ratio — ABNORMAL HIGH (ref 0.0–4.4)
Cholesterol, Total: 396 mg/dL — ABNORMAL HIGH (ref 100–199)
HDL: 60 mg/dL (ref >39)
LDL Chol Calc (NIH): 279 mg/dL — ABNORMAL HIGH (ref 0–99)
Triglycerides: 264 mg/dL — ABNORMAL HIGH (ref 0–149)
VLDL Cholesterol Cal: 57 mg/dL — ABNORMAL HIGH (ref 5–40)

## 2021-01-21 LAB — VITAMIN D 25 HYDROXY (VIT D DEFICIENCY, FRACTURES): Vit D, 25-Hydroxy: 47.4 ng/mL (ref 30.0–100.0)

## 2021-01-21 NOTE — Chronic Care Management (AMB) (Signed)
  Chronic Care Management   Note  01/21/2021 Name: Christine Richards MRN: [id number] DOB: 1945-10-23  [name] is a 75 y.o. year old female who is a primary care patient of Mar Daring, [place]. I reached out to [place] by phone today in response to a referral sent by Ms. Fara Chute V.'s PCP, Mar Daring, PA-C     [name] was given information about Chronic Care Management services today including:  1. CCM service includes personalized support from designated clinical staff supervised by her physician, including individualized plan of care and coordination with other care providers 2. 24/7 contact phone numbers for assistance for urgent and routine care needs. 3. Service will only be billed when office clinical staff spend 20 minutes or more in a month to coordinate care. 4. Only one practitioner may furnish and bill the service in a calendar month. 5. The patient may stop CCM services at any time (effective at the end of the month) by phone call to the office staff. 6. The patient will be responsible for cost sharing (co-pay) of up to 20% of the service fee (after annual deductible is met).  Patient agreed to services and verbal consent obtained.   Follow up plan: Telephone appointment with care management team member scheduled for:02/13/2021  [place], [place] Management  Direct Dial [phone]

## 2021-01-29 ENCOUNTER — Telehealth: Payer: Self-pay

## 2021-01-29 NOTE — Chronic Care Management (AMB) (Signed)
    Chronic Care Management Pharmacy Assistant   Name: Merin Borjon  MRN: [id number] DOB: [birth year]  Reason for Encounter: Patient Assistance Coordination   01/29/2021- Patient assistance form filled out for Januvia with [employer] patient assistance program. Called patient to inform of application being placed in mail, no answer, left message regarding patient assistance form being mailed and if she had any questions she can either hold form until visit with [name], CPP on 02/13/2021 or she may return my call and I can assist, will have area highlighted where patient should sign. [name], CPP notified.   Medications: Outpatient Encounter Medications as of 01/29/2021  Medication Sig  . Blood Glucose Monitoring Suppl ([place]) w/Device KIT New kit with device to check blood sugar once daily  . Cholecalciferol (VITAMIN D) 2000 UNITS CAPS Take 2,000 Units by mouth 2 (two) times daily.   . dapagliflozin propanediol (FARXIGA) 10 MG TABS tablet Take 1 tablet (10 mg total) by mouth daily before breakfast.  . Lancets (ONETOUCH DELICA PLUS [id number]) MISC CHECK BLOOD SUGAR TWICE DAILY AS DIRECTED  . Omega-3 Fatty Acids (FISH OIL) 1000 MG CAPS Take 1,000-2,000 mg by mouth at bedtime.   Glory Rosebush VERIO test strip CHECK BLOOD SUGAR TWICE DAILY  . sitaGLIPtin (JANUVIA) 25 MG tablet Take 1 tablet (25 mg total) by mouth daily.   No facility-administered encounter medications on file as of 01/29/2021.    Star Rating Drugs: Farxiga 10 mg- Last filled 01/19/2021 for a 30 day supply Metformin 500 mg- Last filled 06/09/2020 for 90 day supply at [facility].   SIG: Pattricia Boss, [place] Pharmacist Assistant [phone]

## 2021-02-03 ENCOUNTER — Telehealth: Payer: Self-pay

## 2021-02-03 NOTE — Telephone Encounter (Signed)
Copied from [place]. Topic: General - Other >> Feb 03, 2021 11:58 AM Alanda Slim E wrote: Reason for CRM: pt called and stated she received a financial form from office and it had no Dr. Serena Croissant / she stated she will mail it back to office for signature and if office can mail to [facility] / please advise

## 2021-02-13 ENCOUNTER — Other Ambulatory Visit: Payer: Self-pay

## 2021-02-13 ENCOUNTER — Ambulatory Visit (INDEPENDENT_AMBULATORY_CARE_PROVIDER_SITE_OTHER): Payer: Medicare HMO

## 2021-02-13 DIAGNOSIS — E119 Type 2 diabetes mellitus without complications: Secondary | ICD-10-CM

## 2021-02-13 DIAGNOSIS — E1169 Type 2 diabetes mellitus with other specified complication: Secondary | ICD-10-CM | POA: Diagnosis not present

## 2021-02-13 DIAGNOSIS — E785 Hyperlipidemia, unspecified: Secondary | ICD-10-CM | POA: Diagnosis not present

## 2021-02-13 DIAGNOSIS — M8589 Other specified disorders of bone density and structure, multiple sites: Secondary | ICD-10-CM

## 2021-02-13 MED ORDER — METFORMIN HCL ER 500 MG PO TB24: 500.0000 mg | ORAL_TABLET | Freq: Every day | ORAL | 3 refills | Status: DC

## 2021-02-13 MED ORDER — METFORMIN HCL ER 500 MG PO TB24: 500.0000 mg | ORAL_TABLET | Freq: Every day | ORAL | 0 refills | Status: DC

## 2021-02-13 NOTE — Progress Notes (Signed)
Chronic Care Management Pharmacy Note  02/13/2021 Name:  Christine Richards MRN:  [id number] DOB:  1945-09-07  Summary: Patient is frustrated with her blood sugar readings. She has been taking [place] but has not noticed any improvement in her blood sugar readings. She also reports Wilder Glade has made her feel significant fatigue, and lethargy. She is hesitant about starting any new medications and is adamant about managing her blood sugars with just one medication for as long as possible. She never started Januvia.   Recommendations/Changes made from today's visit: -STOP Farxiga (Ineffective, Side Effects) -STOP Januvia (Never Filled) -START Metformin ER 500 mg daily   Plan: CPP follow-up in 2 weeks    Subjective: Christine Richards is an 75 y.o. year old female who is a primary patient of Mar Daring, [place].  The CCM team was consulted for assistance with disease management and care coordination needs.    Engaged with patient by telephone for initial visit in response to provider referral for pharmacy case management and/or care coordination services.   Consent to Services:  The patient was given the following information about Chronic Care Management services today, agreed to services, and gave verbal consent: 1. CCM service includes personalized support from designated clinical staff supervised by the primary care provider, including individualized plan of care and coordination with other care providers 2. 24/7 contact phone numbers for assistance for urgent and routine care needs. 3. Service will only be billed when office clinical staff spend 20 minutes or more in a month to coordinate care. 4. Only one practitioner may furnish and bill the service in a calendar month. 5.The patient may stop CCM services at any time (effective at the end of the month) by phone call to the office staff. 6. The patient will be responsible for cost sharing (co-pay) of up to 20% of the service fee (after  annual deductible is met). Patient agreed to services and consent obtained.  Patient Care Team: Mar Daring, PA-C as PCP - General (Family Medicine) Leandrew Koyanagi, MD as Referring Physician (Ophthalmology) Lucky Cowboy Erskine Squibb, MD as Referring Physician (Vascular Surgery) Germaine Pomfret, [facility] (Pharmacist)  Recent office visits: 01/20/21: Patient presented to Dr. Brita Romp for follow-up. A1c  stable at 7.6%. LDL 279. Patient refused PCSK9 therapy.  10/22/20: Patient presented to Fenton Malling, PA-C for follow-up. Vascepa stopped (Never filled). A1c worsened to 7.6%.   Recent consult visits: 08/19/20: Patient presented to Dr. Amalia Hailey [facility] visits: None in previous 6 months   Objective:  Lab Results  Component Value Date   CREATININE 0.76 01/20/2021   BUN 21 01/20/2021   GFRNONAA 88 04/18/2020   GFRAA 101 04/18/2020   NA 144 01/20/2021   K 4.1 01/20/2021   CALCIUM 9.5 01/20/2021   CO2 22 01/20/2021   GLUCOSE 146 (H) 01/20/2021    Lab Results  Component Value Date/Time   HGBA1C 7.6 (A) 01/20/2021 01:30 PM   HGBA1C 7.6 (A) 10/22/2020 10:34 AM   HGBA1C 7.2 (H) 04/18/2020 03:05 PM   HGBA1C 8.1 (H) 03/08/2019 08:47 AM   MICROALBUR 50 04/18/2020 02:40 PM   MICROALBUR 50 12/09/2016 10:19 AM    Last diabetic Eye exam:  Lab Results  Component Value Date/Time   HMDIABEYEEXA No Retinopathy 05/08/2020 02:22 PM    Last diabetic Foot exam: No results found for: HMDIABFOOTEX   Lab Results  Component Value Date   CHOL 396 (H) 01/20/2021   HDL 60 01/20/2021   LDLCALC 279 (H)  01/20/2021   TRIG 264 (H) 01/20/2021   CHOLHDL 6.6 (H) 01/20/2021    Hepatic Function Latest Ref Rng & Units 01/20/2021 04/18/2020 03/08/2019  Total Protein 6.0 - 8.5 g/dL 6.8 6.9 6.7  Albumin 3.7 - 4.7 g/dL 4.6 4.5 4.7  AST 0 - 40 IU/L _0 ALT 0 - 32 IU/L _1 Alk Phosphatase 44 - 121 IU/L 95 89 84  Total Bilirubin 0.0 - 1.2 mg/dL 0.4 <0.2 0.6    Lab Results   Component Value Date/Time   TSH 1.740 04/18/2020 03:05 PM   TSH 2.520 03/08/2019 08:47 AM    CBC Latest Ref Rng & Units 04/18/2020 03/08/2019 11/04/2018  WBC 3.4 - 10.8 x10E3/uL 8.2 5.2 4.9  Hemoglobin 11.1 - 15.9 g/dL 13.8 14.3 14.3  Hematocrit 34.0 - 46.6 % 41.7 42.6 44.7  Platelets 150 - 450 x10E3/uL 242 234 219    Lab Results  Component Value Date/Time   VD25OH 47.4 01/20/2021 02:08 PM   VD25OH 29.4 (L) 04/18/2020 03:05 PM    Clinical ASCVD: No  The ASCVD Risk score Mikey Bussing DC Jr., et al., 2013) failed to calculate for the following reasons:   The valid total cholesterol range is 130 to 320 mg/dL    Depression screen [facility] 2/9 01/20/2021 10/22/2020 12/19/2019  Decreased Interest 0 0 0  Down, Depressed, Hopeless 0 0 0  PHQ - 2 Score 0 0 0  Altered sleeping 0 0 -  Tired, decreased energy 0 0 -  Change in appetite 0 0 -  Feeling bad or failure about yourself  0 0 -  Trouble concentrating 0 0 -  Moving slowly or fidgety/restless 0 0 -  Suicidal thoughts 0 0 -  PHQ-9 Score 0 0 -  Difficult doing work/chores Not difficult at all Not difficult at all -    Social History   Tobacco Use  Smoking Status Never  Smokeless Tobacco Never   BP Readings from Last 3 Encounters:  01/20/21 120/63  10/22/20 117/69  07/21/20 113/60   Pulse Readings from Last 3 Encounters:  01/20/21 74  10/22/20 61  07/21/20 72   Wt Readings from Last 3 Encounters:  01/20/21 160 lb (72.6 kg)  10/22/20 160 lb (72.6 kg)  07/21/20 165 lb 9.6 oz (75.1 kg)   BMI Readings from Last 3 Encounters:  01/20/21 26.63 kg/m  10/22/20 26.63 kg/m  07/21/20 27.56 kg/m    Assessment/Interventions: Review of patient past medical history, allergies, medications, health status, including review of consultants reports, laboratory and other test data, was performed as part of comprehensive evaluation and provision of chronic care management services.   SDOH:  (Social Determinants of Health) assessments and  interventions performed: Yes SDOH Interventions    Flowsheet Row Most Recent Value  SDOH Interventions   Financial Strain Interventions Intervention Not Indicated      SDOH Screenings   Alcohol Screen: Low Risk    Last Alcohol Screening Score (AUDIT): 0  Depression (PHQ2-9): Low Risk    PHQ-2 Score: 0  Financial Resource Strain: Medium Risk   Difficulty of Paying Living Expenses: Somewhat hard  Food Insecurity: Not on file  Housing: Not on file  Physical Activity: Not on file  Social Connections: Not on file  Stress: Not on file  Tobacco Use: Low Risk    Smoking Tobacco Use: Never   Smokeless Tobacco Use: Never  Transportation Needs: Not on file    CCM Care Plan  Allergies  Allergen Reactions  Antihistamines, Chlorpheniramine-Type     Facial swelling    Macrobid [Nitrofurantoin Macrocrystal]     unknown   Statins     Muscle Pain, Weakness     Medications Reviewed Today     Reviewed by Germaine Pomfret, [place] (Pharmacist) on 02/13/21 at [place] List Status: <None>   Medication Order Taking? Sig Documenting Provider Last Dose Status Informant  Blood Glucose Monitoring Suppl (ONETOUCH VERIO FLEX SYSTEM) w/Device KIT [id number]  New kit with device to check blood sugar once daily Fenton Malling M, PA-C  Active   Cholecalciferol (VITAMIN D) 2000 UNITS CAPS [id number] Yes Take 2,000 Units by mouth 2 (two) times daily.  [provider] Taking Active Self           Med Note Lesly Dukes Nov 08, 2016  1:00 PM)    Discontinued 02/13/21 1005 (Side effect (s))   Lancets [facility] DELICA PLUS [id number]) MISC [id number]  CHECK BLOOD SUGAR TWICE DAILY AS DIRECTED Mar Daring, PA-C  Active   Omega-3 Fatty Acids (FISH OIL) 1000 MG CAPS [id number] Yes Take 1,000-2,000 mg by mouth at bedtime.  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   Cystocele with prolapse 03/07/2019   Heartburn    Gastritis    Situational anxiety 11/20/2015   Arthritis 02/11/2015   Allergic rhinitis 12/25/2014   Aneurysm ([place]) 12/25/2014   Carotid stenosis 12/25/2014   T2DM (type 2 diabetes mellitus) ([place]) 12/25/2014   Aerophobia 12/25/2014   Fibrositis 12/25/2014   Headache, migraine 12/25/2014   Osteopenia 12/25/2014   Avitaminosis D 12/25/2014   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 09/12/2009   Personal history of TIA (transient ischemic attack) 09/01/2009   Dermatologic disease 04/27/2009   Hyperlipidemia associated with type 2 diabetes mellitus ([place]) 02/20/2007   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   Apnea, sleep 12/02/2006    Immunization History  Administered Date(s) Administered   Pneumococcal Conjugate-13 08/06/2015   Td 12/22/2007   Tdap 12/22/2007    Conditions to be addressed/monitored:  Hyperlipidemia, Diabetes, Osteopenia, and Allergic Rhinitis  Care Plan : General Pharmacy (Adult)  Updates made by Germaine Pomfret, RPH since 02/13/2021 12:00 AM     Problem: Hyperlipidemia, Diabetes, Osteopenia, and Allergic Rhinitis   Priority: High     Long-Range Goal: Patient-Specific Goal   Start Date: 02/13/2021  Expected End Date: 08/15/2021  This Visit's Progress: On track  Priority: High  Note:   Current Barriers:  Unable to achieve control of diabetes  Unable to achieve control of cholesterol   Pharmacist Clinical Goal(s):  Patient will achieve control of diabetes as evidenced by A1c less than 7% Patient will achieve control of cholesterol as evidenced by LDL  less than 100 through collaboration with PharmD  and provider.   Interventions: 1:1 collaboration with Mar Daring, PA-C regarding development and update of comprehensive plan of care as evidenced by provider attestation and co-signature Inter-disciplinary care team collaboration (  see longitudinal plan of care) Comprehensive medication review performed; medication list updated in electronic medical record  Diabetes (A1c goal <7%) -Uncontrolled -Diagnosed 2018  -Current medications: Farxiga 10 mg daily  -Medications previously tried: Metformin (diarrhea), Jardiance   -Has not noticed benefit from [place]. Low energy, weak, little interest in daily activities   -Patient never started Januvia due to desire of not taking multiple medications.  -Current home glucose readings fasting glucose: 172 post prandial glucose: NA -Reports hyperglycemic symptoms: Fatigue, lethargy  -Current meal patterns:  breakfast: Couple of eggs + [place] sausage OR bread + peanut butter. 1 cup of coffee + Creamer lunch: Turkey/Ham Sandwich (full or half) + Fruit OR Nuts OR Applesauce  dinner: Salad + roasted vegetables + Protein ([place])  snacks: Trail Mix. Rarely may have popcorn  drinks: Primarily water.  -Current exercise: 4-5 hours weekly. Weights + Bicycle -Educated on A1c and blood sugar goals; Carbohydrate counting and/or plate method -Counseled to check feet daily and get yearly eye exams -Discussed in detail medication options with patient. Of options discussed, patient was most amenable to retrial of slow-release metformin with slow titration plan.  -STOP Farxiga (Ineffective, Side Effects) -STOP Januvia (Never Filled) -START Metformin ER 500 mg daily   Hyperlipidemia: (LDL goal < 100) -Uncontrolled -Current treatment: Fish Oil 2000 mg daily  -Medications previously tried: Fenofibrate, Vascepa, Statins, Zetia, Niacin  -Educated on Cholesterol goals;  -Discussed mechanism of tolerability profile of PCKS9 Inhibitors such as Repatha.  Patient did not want to make any additional changes today, but will consider it for our next visit.  -Recommended to continue current medication  Osteopenia (Goal Prevent bone fractures) -Uncontrolled -Last DEXA Scan: 05/03/19   T-Score femoral neck: -1.8  T-Score total hip: -0.5  T-Score lumbar spine: NA  T-Score forearm radius: -2.1  10-year probability of major osteoporotic fracture: 17.8%  10-year probability of hip fracture: 3.5% -Patient is a candidate for pharmacologic treatment due to T-Score -1.0 to -2.5 and 10-year risk of hip fracture > 3% -Current treatment  Vitamin D 2000 units daily  -Medications previously tried: NA  -typically gets 1-1.5 of dairy servings daily. -Recommend 1200 mg of calcium daily from dietary and supplemental sources. -Patient is potential candidate for pharmacological treatment for osteopenia, will defer for now given hesitations with medication changes.  -Recommended Calcium carbonate 600 mg with breakfast, patient preferred to wait prior to additional medication changes.   Patient Goals/Self-Care Activities Patient will:  - check glucose daily, document, and provide at future appointments  Follow Up Plan: Telephone follow up appointment with care management team member scheduled for:  02/27/2021 at 9:00 AM      Medication Assistance: None required.  Patient affirms current coverage meets needs.  Compliance/Adherence/Medication fill history: Care Gaps: Shingrix, PPSV23, Tdap   Star-Rating Drugs: Farxiga 10 mg: LF 01/19/21 for 30-DS at [place]  Patient's preferred pharmacy is:  [facility] [id number] - [place], [place] - [address] [place] [address] Phone: [phone] Fax: [place], [place] - [place] [address] [place] [address] Phone: [phone] Fax: [phone]  Uses pill box? Yes Pt endorses 100%  compliance  We discussed: Current pharmacy is preferred with insurance plan and patient is satisfied with pharmacy services Patient decided to: Continue current medication management strategy  Care Plan and Follow Up Patient Decision:  Patient agrees to Care Plan and Follow-up.  Plan: Telephone follow up appointment with care  management team member scheduled for:  02/27/2021 at 9:00 AM  [name], PharmD, [place] [phone]

## 2021-02-13 NOTE — Patient Instructions (Signed)
Visit Information It was great speaking with you today!  Please let me know if you have any questions about our visit.   Goals Addressed             This Visit's Progress    Monitor and Manage My Blood Sugar-Diabetes Type 2       Timeframe:  Long-Range Goal Priority:  High Start Date: 02/13/2021                             Expected End Date: 08/15/2021                      Follow Up Date 02/27/2021    - check blood sugar at prescribed times - check blood sugar if I feel it is too high or too low - enter blood sugar readings and medication or insulin into daily log    Why is this important?   Checking your blood sugar at home helps to keep it from getting very high or very low.  Writing the results in a diary or log helps the doctor know how to care for you.  Your blood sugar log should have the time, date and the results.  Also, write down the amount of insulin or other medicine that you take.  Other information, like what you ate, exercise done and how you were feeling, will also be helpful.     Notes:          Patient Care Plan: General Pharmacy (Adult)     Problem Identified: Hyperlipidemia, Diabetes, Osteopenia, and Allergic Rhinitis   Priority: High     Long-Range Goal: Patient-Specific Goal   Start Date: 02/13/2021  Expected End Date: 08/15/2021  This Visit's Progress: On track  Priority: High  Note:   Current Barriers:  Unable to achieve control of diabetes  Unable to achieve control of cholesterol   Pharmacist Clinical Goal(s):  Patient will achieve control of diabetes as evidenced by A1c less than 7% Patient will achieve control of cholesterol as evidenced by LDL  less than 100 through collaboration with PharmD and provider.   Interventions: 1:1 collaboration with Mar Daring, PA-C regarding development and update of comprehensive plan of care as evidenced by provider attestation and co-signature Inter-disciplinary care team collaboration (see  longitudinal plan of care) Comprehensive medication review performed; medication list updated in electronic medical record  Diabetes (A1c goal <7%) -Uncontrolled -Diagnosed 2018  -Current medications: Farxiga 10 mg daily  -Medications previously tried: Metformin (diarrhea), Jardiance   -Has not noticed benefit from [place]. Low energy, weak, little interest in daily activities   -Patient never started Januvia due to desire of not taking multiple medications.  -Current home glucose readings fasting glucose: 172 post prandial glucose: NA -Reports hyperglycemic symptoms: Fatigue, lethargy  -Current meal patterns:  breakfast: Couple of eggs + [place] sausage OR bread + peanut butter. 1 cup of coffee + Creamer lunch: Turkey/Ham Sandwich (full or half) + Fruit OR Nuts OR Applesauce  dinner: Salad + roasted vegetables + Protein ([place])  snacks: Trail Mix. Rarely may have popcorn  drinks: Primarily water.  -Current exercise: 4-5 hours weekly. Weights + Bicycle -Educated on A1c and blood sugar goals; Carbohydrate counting and/or plate method -Counseled to check feet daily and get yearly eye exams -Discussed in detail medication options with patient. Of options discussed, patient was most amenable to retrial of slow-release metformin with slow titration plan.  -STOP  Farxiga (Ineffective, Side Effects) -STOP Januvia (Never Filled) -START Metformin ER 500 mg daily   Hyperlipidemia: (LDL goal < 100) -Uncontrolled -Current treatment: Fish Oil 2000 mg daily  -Medications previously tried: Fenofibrate, Vascepa, Statins, Zetia, Niacin  -Educated on Cholesterol goals;  -Discussed mechanism of tolerability profile of PCKS9 Inhibitors such as Repatha. Patient did not want to make any additional changes today, but will consider it for our next visit.  -Recommended to continue current medication  Osteopenia (Goal Prevent bone fractures) -Uncontrolled -Last DEXA Scan: 05/03/19   T-Score femoral  neck: -1.8  T-Score total hip: -0.5  T-Score lumbar spine: NA  T-Score forearm radius: -2.1  10-year probability of major osteoporotic fracture: 17.8%  10-year probability of hip fracture: 3.5% -Patient is a candidate for pharmacologic treatment due to T-Score -1.0 to -2.5 and 10-year risk of hip fracture > 3% -Current treatment  Vitamin D 2000 units daily  -Medications previously tried: NA  -typically gets 1-1.5 of dairy servings daily. -Recommend 1200 mg of calcium daily from dietary and supplemental sources. -Patient is potential candidate for pharmacological treatment for osteopenia, will defer for now given hesitations with medication changes.  -Recommended Calcium carbonate 600 mg with breakfast, patient preferred to wait prior to additional medication changes.   Patient Goals/Self-Care Activities Patient will:  - check glucose daily, document, and provide at future appointments  Follow Up Plan: Telephone follow up appointment with care management team member scheduled for:  02/27/2021 at 9:00 AM    Ms. Goedde was given information about Chronic Care Management services today including:  CCM service includes personalized support from designated clinical staff supervised by her physician, including individualized plan of care and coordination with other care providers 24/7 contact phone numbers for assistance for urgent and routine care needs. Standard insurance, coinsurance, copays and deductibles apply for chronic care management only during months in which we provide at least 20 minutes of these services. Most insurances cover these services at 100%, however patients may be responsible for any copay, coinsurance and/or deductible if applicable. This service may help you avoid the need for more expensive face-to-face services. Only one practitioner may furnish and bill the service in a calendar month. The patient may stop CCM services at any time (effective at the end of the month) by  phone call to the office staff.  Patient agreed to services and verbal consent obtained.   Patient verbalizes understanding of instructions provided today and agrees to view in [place].   [name], PharmD, [place] [phone]

## 2021-02-26 ENCOUNTER — Telehealth: Payer: Self-pay

## 2021-02-26 NOTE — Progress Notes (Signed)
Spoke to patient to confirmed patient telephone appointment on 02/27/2021 for CCM at 9:00 am with [name] the Clinical pharmacist.   Patient Verbalized understanding.  [place] Pharmacist Assistant [phone]

## 2021-02-27 ENCOUNTER — Ambulatory Visit (INDEPENDENT_AMBULATORY_CARE_PROVIDER_SITE_OTHER): Payer: Medicare HMO

## 2021-02-27 DIAGNOSIS — E785 Hyperlipidemia, unspecified: Secondary | ICD-10-CM | POA: Diagnosis not present

## 2021-02-27 DIAGNOSIS — E1169 Type 2 diabetes mellitus with other specified complication: Secondary | ICD-10-CM

## 2021-02-27 DIAGNOSIS — E119 Type 2 diabetes mellitus without complications: Secondary | ICD-10-CM

## 2021-02-27 MED ORDER — METFORMIN HCL ER 500 MG PO TB24: 500.0000 mg | ORAL_TABLET | Freq: Two times a day (BID) | ORAL | 1 refills | Status: DC

## 2021-02-27 NOTE — Progress Notes (Signed)
Chronic Care Management Pharmacy Note  02/27/2021 Name:  Christine Richards MRN:  [id number] DOB:  [birth year]  Summary: Gained 6 pounds since stopping [place]. Still has some fatigue, but feels it is improving. Job been very stressful recently, which she feels impacts her energy levels. She has been taking Metformin for two weeks, and did have significant side effects for the first few days which have mostly resolved now. She is amenable to increasing her metformin dose when she finishes her current supply.   Recommendations/Changes made from today's visit: -INCREASE Metformin ER to 500 mg twice daily   Plan: CPP follow-up in 4 weeks    Subjective: Christine Richards is an 75 y.o. year old female who is a primary patient of Mar Daring, [place].  The CCM team was consulted for assistance with disease management and care coordination needs.    Engaged with patient by telephone for follow up visit in response to provider referral for pharmacy case management and/or care coordination services.   Consent to Services:  The patient was given information about Chronic Care Management services, agreed to services, and gave verbal consent prior to initiation of services.  Please see initial visit note for detailed documentation.   Patient Care Team: Mar Daring, PA-C as PCP - General (Family Medicine) Leandrew Koyanagi, MD as Referring Physician (Ophthalmology) Lucky Cowboy Erskine Squibb, MD as Referring Physician (Vascular Surgery) Germaine Pomfret, [facility] (Pharmacist)  Recent office visits: 01/20/21: Patient presented to Dr. Brita Romp for follow-up. A1c  stable at 7.6%. LDL 279. Patient refused PCSK9 therapy.  10/22/20: Patient presented to Fenton Malling, PA-C for follow-up. Vascepa stopped (Never filled). A1c worsened to 7.6%.   Recent consult visits: 08/19/20: Patient presented to Dr. Amalia Hailey [facility] visits: None in previous 6 months   Objective:  Lab Results   Component Value Date   CREATININE 0.76 01/20/2021   BUN 21 01/20/2021   GFRNONAA 88 04/18/2020   GFRAA 101 04/18/2020   NA 144 01/20/2021   K 4.1 01/20/2021   CALCIUM 9.5 01/20/2021   CO2 22 01/20/2021   GLUCOSE 146 (H) 01/20/2021    Lab Results  Component Value Date/Time   HGBA1C 7.6 (A) 01/20/2021 01:30 PM   HGBA1C 7.6 (A) 10/22/2020 10:34 AM   HGBA1C 7.2 (H) 04/18/2020 03:05 PM   HGBA1C 8.1 (H) 03/08/2019 08:47 AM   MICROALBUR 50 04/18/2020 02:40 PM   MICROALBUR 50 12/09/2016 10:19 AM    Last diabetic Eye exam:  Lab Results  Component Value Date/Time   HMDIABEYEEXA No Retinopathy 05/08/2020 02:22 PM    Last diabetic Foot exam: No results found for: HMDIABFOOTEX   Lab Results  Component Value Date   CHOL 396 (H) 01/20/2021   HDL 60 01/20/2021   LDLCALC 279 (H) 01/20/2021   TRIG 264 (H) 01/20/2021   CHOLHDL 6.6 (H) 01/20/2021    Hepatic Function Latest Ref Rng & Units 01/20/2021 04/18/2020 03/08/2019  Total Protein 6.0 - 8.5 g/dL 6.8 6.9 6.7  Albumin 3.7 - 4.7 g/dL 4.6 4.5 4.7  AST 0 - 40 IU/L 15 14 18   ALT 0 - 32 IU/L 16 14 12   Alk Phosphatase 44 - 121 IU/L 95 89 84  Total Bilirubin 0.0 - 1.2 mg/dL 0.4 <0.2 0.6    Lab Results  Component Value Date/Time   TSH 1.740 04/18/2020 03:05 PM   TSH 2.520 03/08/2019 08:47 AM    CBC Latest Ref Rng & Units 04/18/2020 03/08/2019 11/04/2018  WBC 3.4 -  10.8 x10E3/uL 8.2 5.2 4.9  Hemoglobin 11.1 - 15.9 g/dL 13.8 14.3 14.3  Hematocrit 34.0 - 46.6 % 41.7 42.6 44.7  Platelets 150 - 450 x10E3/uL 242 234 219    Lab Results  Component Value Date/Time   VD25OH 47.4 01/20/2021 02:08 PM   VD25OH 29.4 (L) 04/18/2020 03:05 PM    Clinical ASCVD: No  The ASCVD Risk score Mikey Bussing DC Jr., et al., 2013) failed to calculate for the following reasons:   The valid total cholesterol range is 130 to 320 mg/dL    Depression screen [facility] 2/9 01/20/2021 10/22/2020 12/19/2019  Decreased Interest 0 0 0  Down, Depressed, Hopeless 0 0 0  PHQ - 2  Score 0 0 0  Altered sleeping 0 0 -  Tired, decreased energy 0 0 -  Change in appetite 0 0 -  Feeling bad or failure about yourself  0 0 -  Trouble concentrating 0 0 -  Moving slowly or fidgety/restless 0 0 -  Suicidal thoughts 0 0 -  PHQ-9 Score 0 0 -  Difficult doing work/chores Not difficult at all Not difficult at all -    Social History   Tobacco Use  Smoking Status Never  Smokeless Tobacco Never   BP Readings from Last 3 Encounters:  01/20/21 120/63  10/22/20 117/69  07/21/20 113/60   Pulse Readings from Last 3 Encounters:  01/20/21 74  10/22/20 61  07/21/20 72   Wt Readings from Last 3 Encounters:  01/20/21 160 lb (72.6 kg)  10/22/20 160 lb (72.6 kg)  07/21/20 165 lb 9.6 oz (75.1 kg)   BMI Readings from Last 3 Encounters:  01/20/21 26.63 kg/m  10/22/20 26.63 kg/m  07/21/20 27.56 kg/m    Assessment/Interventions: Review of patient past medical history, allergies, medications, health status, including review of consultants reports, laboratory and other test data, was performed as part of comprehensive evaluation and provision of chronic care management services.   SDOH:  (Social Determinants of Health) assessments and interventions performed: Yes   SDOH Screenings   Alcohol Screen: Low Risk    Last Alcohol Screening Score (AUDIT): 0  Depression (PHQ2-9): Low Risk    PHQ-2 Score: 0  Financial Resource Strain: Medium Risk   Difficulty of Paying Living Expenses: Somewhat hard  Food Insecurity: Not on file  Housing: Not on file  Physical Activity: Not on file  Social Connections: Not on file  Stress: Not on file  Tobacco Use: Low Risk    Smoking Tobacco Use: Never   Smokeless Tobacco Use: Never  Transportation Needs: Not on file    [place]  Allergies  Allergen Reactions   Antihistamines, Chlorpheniramine-Type     Facial swelling    Macrobid [Nitrofurantoin Macrocrystal]     unknown   Statins     Muscle Pain, Weakness     Medications  Reviewed Today     Reviewed by Germaine Pomfret, RPH (Pharmacist) on 02/13/21 at [place] List Status: <None>   Medication Order Taking? Sig Documenting Provider Last Dose Status Informant  Blood Glucose Monitoring Suppl (ONETOUCH VERIO FLEX SYSTEM) w/Device KIT [id number]  New kit with device to check blood sugar once daily Fenton Malling M, PA-C  Active   Cholecalciferol (VITAMIN D) 2000 UNITS CAPS [id number] Yes Take 2,000 Units by mouth 2 (two) times daily.  [provider] Taking Active Self           Med Note Lesly Dukes Nov 08, 2016  1:00 PM)    Discontinued 02/13/21 1005 (Side effect (s))   Lancets [facility] DELICA PLUS [id number]) MISC [id number]  CHECK BLOOD SUGAR TWICE DAILY AS DIRECTED Mar Daring, PA-C  Active   Omega-3 Fatty Acids (FISH OIL) 1000 MG CAPS [id number] Yes Take 1,000-2,000 mg by mouth at bedtime.  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   Cystocele with prolapse 03/07/2019   Heartburn    Gastritis    Situational anxiety 11/20/2015   Arthritis 02/11/2015   Allergic rhinitis 12/25/2014   Aneurysm ([place]) 12/25/2014   Carotid stenosis 12/25/2014   T2DM (type 2 diabetes mellitus) ([place]) 12/25/2014   Aerophobia 12/25/2014   Fibrositis 12/25/2014   Headache, migraine 12/25/2014   Osteopenia 12/25/2014   Avitaminosis D 12/25/2014   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    Medication Assistance: None required.  Patient affirms current coverage meets needs.  Compliance/Adherence/Medication fill history: Care Gaps: Shingrix, PPSV23, Tdap   Star-Rating Drugs: Farxiga 10 mg: LF 01/19/21 for 30-DS at [place]  Patient's preferred pharmacy is:  [facility] [id number] - [address], [place] [address] Phone: [phone] Fax: [phone]  [place], [place] - [address] [place] [address] Phone: [phone] Fax: [place] #[id number] [address], [address] [place] [address] Phone: [phone] Fax: [phone]  Uses pill box? Yes Pt endorses 100% compliance  We discussed: Current pharmacy is preferred with insurance plan and patient is satisfied with pharmacy services Patient decided to: Continue current medication management strategy  Care Plan and Follow Up Patient Decision:  Patient agrees to Care Plan and Follow-up.  Plan: Telephone follow up appointment with care management team member scheduled for:  04/10/2021 at 11:45 AM  [name], PharmD, [place] [phone]

## 2021-02-27 NOTE — Patient Instructions (Signed)
Visit Information It was great speaking with you today!  Please let me know if you have any questions about our visit.   Goals Addressed             This Visit's Progress    Monitor and Manage My Blood Sugar-Diabetes Type 2   On track    Timeframe:  Long-Range Goal Priority:  High Start Date: 02/13/2021                             Expected End Date: 08/15/2021                      Follow Up Date 04/10/2021    - check blood sugar at prescribed times - check blood sugar if I feel it is too high or too low - enter blood sugar readings and medication or insulin into daily log    Why is this important?   Checking your blood sugar at home helps to keep it from getting very high or very low.  Writing the results in a diary or log helps the doctor know how to care for you.  Your blood sugar log should have the time, date and the results.  Also, write down the amount of insulin or other medicine that you take.  Other information, like what you ate, exercise done and how you were feeling, will also be helpful.     Notes:          Patient Care Plan: General Pharmacy (Adult)     Problem Identified: Hyperlipidemia, Diabetes, Osteopenia, and Allergic Rhinitis   Priority: High     Long-Range Goal: Patient-Specific Goal   Start Date: 02/13/2021  Expected End Date: 08/15/2021  Recent Progress: On track  Priority: High  Note:   Current Barriers:  Unable to achieve control of diabetes  Unable to achieve control of cholesterol   Pharmacist Clinical Goal(s):  Patient will achieve control of diabetes as evidenced by A1c less than 7% Patient will achieve control of cholesterol as evidenced by LDL  less than 100 through collaboration with PharmD and provider.   Interventions: 1:1 collaboration with Mar Daring, PA-C regarding development and update of comprehensive plan of care as evidenced by provider attestation and co-signature Inter-disciplinary care team collaboration  (see longitudinal plan of care) Comprehensive medication review performed; medication list updated in electronic medical record  Diabetes (A1c goal <7%) -Uncontrolled -Diagnosed 2018  -Current medications: Metformin XR 500 mg daily  -Medications previously tried: Metformin (diarrhea), Jardiance (ineffective), Farxiga (fatigue), Januvia (never filled)   -Has not noticed benefit from [place]. Low energy, weak, little interest in daily activities   -Patient with significant GI upset first few days of starting metformin, currently tolerating well. Blood sugars are stable, maybe slightly improved in the last few days. Patient does note she gained 6 pounds since stopping [place].  -Current home glucose readings fasting glucose: 170-180s post prandial glucose: NA -Reports hyperglycemic symptoms: Fatigue, lethargy  -Current meal patterns:  breakfast: Couple of eggs + [place] sausage OR bread + peanut butter. 1 cup of coffee + Creamer lunch: Turkey/Ham Sandwich (full or half) + Fruit OR Nuts OR Applesauce  dinner: Salad + roasted vegetables + Protein ([place])  snacks: Trail Mix. Rarely may have popcorn  drinks: Primarily water.  -Current exercise: 4-5 hours weekly. Weights + Bicycle -INCREASE Metformin ER to 500 mg twice daily   Hyperlipidemia: (LDL goal < 100) -Uncontrolled -  Current treatment: Fish Oil 2000 mg daily  -Medications previously tried: Fenofibrate, Vascepa, Statins (myalgia), Zetia, Niacin (hair loss)  -Educated on Cholesterol goals;  -Discussed mechanism of tolerability profile of PCKS9 Inhibitors such as Repatha. Patient did not want to make any additional changes today, but will consider it for our next visit.  -Recommended to continue current medication  Osteopenia (Goal Prevent bone fractures) -Uncontrolled -Last DEXA Scan: 05/03/19   T-Score femoral neck: -1.8  T-Score total hip: -0.5  T-Score lumbar spine: NA  T-Score forearm radius: -2.1  10-year probability of major  osteoporotic fracture: 17.8%  10-year probability of hip fracture: 3.5% -Patient is a candidate for pharmacologic treatment due to T-Score -1.0 to -2.5 and 10-year risk of hip fracture > 3% -Current treatment  Vitamin D 2000 units daily  -Medications previously tried: NA  -typically gets 1-1.5 of dairy servings daily. -Recommend 1200 mg of calcium daily from dietary and supplemental sources. -Patient is potential candidate for pharmacological treatment for osteopenia, will defer for now given hesitations with medication changes.  -Recommended switching to calcium + vitamin D supplement daily    Patient Goals/Self-Care Activities Patient will:  - check glucose daily, document, and provide at future appointments  Follow Up Plan: Telephone follow up appointment with care management team member scheduled for:  04/10/2021 at 11:45 AM    Patient agreed to services and verbal consent obtained.   Patient verbalizes understanding of instructions provided today and agrees to view in [place].   [name], PharmD, [place] [phone]

## 2021-03-15 ENCOUNTER — Other Ambulatory Visit: Payer: Self-pay | Admitting: Family Medicine

## 2021-03-15 DIAGNOSIS — E119 Type 2 diabetes mellitus without complications: Secondary | ICD-10-CM

## 2021-03-15 NOTE — Telephone Encounter (Signed)
dose was changed  last RF 02/27/21 #60 1 RF

## 2021-03-21 DIAGNOSIS — Z6828 Body mass index (BMI) 28.0-28.9, adult: Secondary | ICD-10-CM | POA: Diagnosis not present

## 2021-03-21 DIAGNOSIS — Z008 Encounter for other general examination: Secondary | ICD-10-CM | POA: Diagnosis not present

## 2021-03-21 DIAGNOSIS — M069 Rheumatoid arthritis, unspecified: Secondary | ICD-10-CM | POA: Diagnosis not present

## 2021-03-21 DIAGNOSIS — Z7984 Long term (current) use of oral hypoglycemic drugs: Secondary | ICD-10-CM | POA: Diagnosis not present

## 2021-03-21 DIAGNOSIS — M1990 Unspecified osteoarthritis, unspecified site: Secondary | ICD-10-CM | POA: Diagnosis not present

## 2021-03-21 DIAGNOSIS — R030 Elevated blood-pressure reading, without diagnosis of hypertension: Secondary | ICD-10-CM | POA: Diagnosis not present

## 2021-03-21 DIAGNOSIS — E663 Overweight: Secondary | ICD-10-CM | POA: Diagnosis not present

## 2021-03-21 DIAGNOSIS — M8580 Other specified disorders of bone density and structure, unspecified site: Secondary | ICD-10-CM | POA: Diagnosis not present

## 2021-03-21 DIAGNOSIS — E119 Type 2 diabetes mellitus without complications: Secondary | ICD-10-CM | POA: Diagnosis not present

## 2021-03-21 DIAGNOSIS — G4733 Obstructive sleep apnea (adult) (pediatric): Secondary | ICD-10-CM | POA: Diagnosis not present

## 2021-03-21 DIAGNOSIS — E785 Hyperlipidemia, unspecified: Secondary | ICD-10-CM | POA: Diagnosis not present

## 2021-03-21 LAB — MICROALBUMIN, URINE: Microalb, Ur: 10

## 2021-04-09 ENCOUNTER — Telehealth: Payer: Self-pay

## 2021-04-09 NOTE — Progress Notes (Signed)
Spoke to patient to confirmed patient telephone appointment on 04/10/2021 for CCM at 11:45 pm with [name] the Clinical pharmacist.   Patient Verbalized understanding.Patient states she does not having any energy, and she is unsure if it is one of her medication she is currently taking.   [place] Pharmacist Assistant [phone]

## 2021-04-10 ENCOUNTER — Ambulatory Visit (INDEPENDENT_AMBULATORY_CARE_PROVIDER_SITE_OTHER): Payer: Medicare HMO

## 2021-04-10 DIAGNOSIS — E1169 Type 2 diabetes mellitus with other specified complication: Secondary | ICD-10-CM | POA: Diagnosis not present

## 2021-04-10 DIAGNOSIS — M8589 Other specified disorders of bone density and structure, multiple sites: Secondary | ICD-10-CM

## 2021-04-10 DIAGNOSIS — E119 Type 2 diabetes mellitus without complications: Secondary | ICD-10-CM

## 2021-04-10 DIAGNOSIS — E785 Hyperlipidemia, unspecified: Secondary | ICD-10-CM | POA: Diagnosis not present

## 2021-04-10 MED ORDER — METFORMIN HCL ER 500 MG PO TB24: 500.0000 mg | ORAL_TABLET | Freq: Two times a day (BID) | ORAL | 1 refills | Status: DC

## 2021-04-10 NOTE — Progress Notes (Signed)
Chronic Care Management Pharmacy Note  04/23/2021 Name:  Tayler Heiden MRN:  [id number] DOB:  October 18, 1945  Summary: Patient presents for CCM follow-up. Blood sugars are still slightly elevated, but patient continues to experience fatigue and lethargy. Has not had significant GI upset with metformin. She is concerned about her cholesterol, but was not interested in Repatha at this time due to fear of medications and medication side effects. Her job continues to be a significant source of stress in her life.   Recommendations/Changes made from today's visit: -Continue current medications  Plan: CPP follow-up in 4 weeks    Subjective: Reeshemah Nazaryan is an 75 y.o. year old female who is a primary patient of Mar Daring, [place].  The CCM team was consulted for assistance with disease management and care coordination needs.    Engaged with patient by telephone for follow up visit in response to provider referral for pharmacy case management and/or care coordination services.   Consent to Services:  The patient was given information about Chronic Care Management services, agreed to services, and gave verbal consent prior to initiation of services.  Please see initial visit note for detailed documentation.   Patient Care Team: Mar Daring, PA-C as PCP - General (Family Medicine) Leandrew Koyanagi, MD as Referring Physician (Ophthalmology) Lucky Cowboy Erskine Squibb, MD as Referring Physician (Vascular Surgery) Germaine Pomfret, [facility] (Pharmacist)  Recent office visits: 01/20/21: Patient presented to Dr. Brita Romp for follow-up. A1c  stable at 7.6%. LDL 279. Patient refused PCSK9 therapy.  10/22/20: Patient presented to Fenton Malling, PA-C for follow-up. Vascepa stopped (Never filled). A1c worsened to 7.6%.   Recent consult visits: 08/19/20: Patient presented to Dr. Amalia Hailey (Podiatry)   [facility] visits: None in previous 6 months   Objective:  Lab Results  Component Value  Date   CREATININE 0.76 01/20/2021   BUN 21 01/20/2021   GFRNONAA 88 04/18/2020   GFRAA 101 04/18/2020   NA 144 01/20/2021   K 4.1 01/20/2021   CALCIUM 9.5 01/20/2021   CO2 22 01/20/2021   GLUCOSE 146 (H) 01/20/2021    Lab Results  Component Value Date/Time   HGBA1C 7.6 (A) 01/20/2021 01:30 PM   HGBA1C 7.6 (A) 10/22/2020 10:34 AM   HGBA1C 7.2 (H) 04/18/2020 03:05 PM   HGBA1C 8.1 (H) 03/08/2019 08:47 AM   MICROALBUR 10 03/21/2021 12:00 AM   MICROALBUR 50 04/18/2020 02:40 PM   MICROALBUR 50 12/09/2016 10:19 AM    Last diabetic Eye exam:  Lab Results  Component Value Date/Time   HMDIABEYEEXA No Retinopathy 05/08/2020 02:22 PM    Last diabetic Foot exam: No results found for: HMDIABFOOTEX   Lab Results  Component Value Date   CHOL 396 (H) 01/20/2021   HDL 60 01/20/2021   LDLCALC 279 (H) 01/20/2021   TRIG 264 (H) 01/20/2021   CHOLHDL 6.6 (H) 01/20/2021    Hepatic Function Latest Ref Rng & Units 01/20/2021 04/18/2020 03/08/2019  Total Protein 6.0 - 8.5 g/dL 6.8 6.9 6.7  Albumin 3.7 - 4.7 g/dL 4.6 4.5 4.7  AST 0 - 40 IU/L 15 14 18   ALT 0 - 32 IU/L 16 14 12   Alk Phosphatase 44 - 121 IU/L 95 89 84  Total Bilirubin 0.0 - 1.2 mg/dL 0.4 <0.2 0.6    Lab Results  Component Value Date/Time   TSH 1.740 04/18/2020 03:05 PM   TSH 2.520 03/08/2019 08:47 AM    CBC Latest Ref Rng & Units 04/18/2020 03/08/2019 11/04/2018  WBC 3.4 -  10.8 x10E3/uL 8.2 5.2 4.9  Hemoglobin 11.1 - 15.9 g/dL 13.8 14.3 14.3  Hematocrit 34.0 - 46.6 % 41.7 42.6 44.7  Platelets 150 - 450 x10E3/uL 242 234 219    Lab Results  Component Value Date/Time   VD25OH 47.4 01/20/2021 02:08 PM   VD25OH 29.4 (L) 04/18/2020 03:05 PM    Clinical ASCVD: No  The ASCVD Risk score Mikey Bussing DC Jr., et al., 2013) failed to calculate for the following reasons:   The valid total cholesterol range is 130 to 320 mg/dL    Depression screen [facility] 2/9 01/20/2021 10/22/2020 12/19/2019  Decreased Interest 0 0 0  Down, Depressed, Hopeless 0  0 0  PHQ - 2 Score 0 0 0  Altered sleeping 0 0 -  Tired, decreased energy 0 0 -  Change in appetite 0 0 -  Feeling bad or failure about yourself  0 0 -  Trouble concentrating 0 0 -  Moving slowly or fidgety/restless 0 0 -  Suicidal thoughts 0 0 -  PHQ-9 Score 0 0 -  Difficult doing work/chores Not difficult at all Not difficult at all -    Social History   Tobacco Use  Smoking Status Never  Smokeless Tobacco Never   BP Readings from Last 3 Encounters:  01/20/21 120/63  10/22/20 117/69  07/21/20 113/60   Pulse Readings from Last 3 Encounters:  01/20/21 74  10/22/20 61  07/21/20 72   Wt Readings from Last 3 Encounters:  01/20/21 160 lb (72.6 kg)  10/22/20 160 lb (72.6 kg)  07/21/20 165 lb 9.6 oz (75.1 kg)   BMI Readings from Last 3 Encounters:  01/20/21 26.63 kg/m  10/22/20 26.63 kg/m  07/21/20 27.56 kg/m    Assessment/Interventions: Review of patient past medical history, allergies, medications, health status, including review of consultants reports, laboratory and other test data, was performed as part of comprehensive evaluation and provision of chronic care management services.   SDOH:  (Social Determinants of Health) assessments and interventions performed: Yes SDOH Interventions    Flowsheet Row Most Recent Value  SDOH Interventions   Financial Strain Interventions Intervention Not Indicated       SDOH Screenings   Alcohol Screen: Low Risk    Last Alcohol Screening Score (AUDIT): 0  Depression (PHQ2-9): Low Risk    PHQ-2 Score: 0  Financial Resource Strain: Low Risk    Difficulty of Paying Living Expenses: Not hard at all  Food Insecurity: Not on file  Housing: Not on file  Physical Activity: Not on file  Social Connections: Not on file  Stress: Not on file  Tobacco Use: Low Risk    Smoking Tobacco Use: Never   Smokeless Tobacco Use: Never  Transportation Needs: Not on file    [place]  Allergies  Allergen Reactions    Antihistamines, Chlorpheniramine-Type     Facial swelling    Macrobid [Nitrofurantoin Macrocrystal]     unknown   Statins     Muscle Pain, Weakness     Medications Reviewed Today     Reviewed by Germaine Pomfret, RPH (Pharmacist) on 02/13/21 at [place] List Status: <None>   Medication Order Taking? Sig Documenting Provider Last Dose Status Informant  Blood Glucose Monitoring Suppl (ONETOUCH VERIO FLEX SYSTEM) w/Device KIT [id number]  New kit with device to check blood sugar once daily Fenton Malling M, PA-C  Active   Cholecalciferol (VITAMIN D) 2000 UNITS CAPS [id number] Yes Take 2,000 Units by mouth 2 (two) times daily.  [provider] Taking Active Self           Med Note Lesly Dukes Nov 08, 2016  1:00 PM)    Discontinued 02/13/21 1005 (Side effect (s))   Lancets [facility] DELICA PLUS [id number]) MISC [id number]  CHECK BLOOD SUGAR TWICE DAILY AS DIRECTED Mar Daring, PA-C  Active   Omega-3 Fatty Acids (FISH OIL) 1000 MG CAPS [id number] Yes Take 1,000-2,000 mg by mouth at bedtime.  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   Cystocele with prolapse 03/07/2019   Heartburn    Gastritis    Situational anxiety 11/20/2015   Arthritis 02/11/2015   Allergic rhinitis 12/25/2014   Aneurysm ([place]) 12/25/2014   Carotid stenosis 12/25/2014   T2DM (type 2 diabetes mellitus) ([place]) 12/25/2014   Aerophobia 12/25/2014   Fibrositis 12/25/2014   Headache, migraine 12/25/2014   Osteopenia 12/25/2014   Avitaminosis D 12/25/2014   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 09/12/2009   Personal history of TIA (transient ischemic attack) 09/01/2009   Dermatologic disease 04/27/2009   Hyperlipidemia associated with type 2 diabetes mellitus ([place]) 02/20/2007   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   Apnea, sleep 12/02/2006    Immunization History  Administered Date(s) Administered   Pneumococcal Conjugate-13 08/06/2015   Td 12/22/2007   Tdap 12/22/2007    Conditions to be addressed/monitored:  Hyperlipidemia, Diabetes, Osteopenia, and Allergic Rhinitis  Care Plan : General Pharmacy (Adult)  Updates made by Germaine Pomfret, RPH since 04/23/2021 12:00 AM     Problem: Hyperlipidemia, Diabetes, Osteopenia, and Allergic Rhinitis   Priority: High     Long-Range Goal: Patient-Specific Goal   Start Date: 02/13/2021  Expected End Date: 08/15/2021  This Visit's Progress: On track  Recent Progress: On track  Priority: High  Note:   Current Barriers:  Unable to achieve control of diabetes  Unable to achieve control of cholesterol   Pharmacist Clinical Goal(s):  Patient will achieve control of diabetes as evidenced by A1c less than 7% Patient will achieve control of cholesterol as evidenced by LDL  less than 100 through collaboration with PharmD and provider.   Interventions: 1:1 collaboration with Mar Daring, PA-C regarding development and update of comprehensive plan of care as evidenced by provider attestation and co-signature Inter-disciplinary care team collaboration (see longitudinal plan of care) Comprehensive medication review performed; medication list updated in electronic medical record  Diabetes (A1c goal <7%) -Uncontrolled -Diagnosed 2018  -Current medications: Metformin XR 500 mg twice daily  -Medications previously tried: Metformin (diarrhea), Jardiance (ineffective), Farxiga (fatigue), Januvia (never filled)   -Reports fatigue, bloating,  -Current home glucose readings fasting glucose: Average 158   post prandial glucose: NA -Reports hyperglycemic symptoms: Fatigue, lethargy  -Current meal patterns:  breakfast: Couple of eggs + [place] sausage OR bread + peanut butter. 1 cup of coffee + Creamer lunch: Turkey/Ham Sandwich (full or half) + Fruit OR Nuts OR Applesauce  dinner: Salad + roasted vegetables +  Protein (Salmon)  snacks: Trail Mix. Rarely may have popcorn  drinks: Primarily water.  -Current exercise: 4-5 hours weekly. Weights + Bicycle -Continue current medications  Hyperlipidemia: (LDL goal < 70) -Uncontrolled -Brother heart attack 74 -Mother heart attack 28  -Current treatment: Fish Oil 2000 mg daily  -Medications previously tried: Fenofibrate, Vascepa, Statins (myalgia), Zetia, Niacin (hair loss)  -Educated on Cholesterol goals;  -Discussed mechanism of tolerability profile of PCKS9 Inhibitors such as Repatha. Patient did not want to initiate therapy at this time today.  -Recommended to continue current medication  Osteopenia (Goal Prevent bone fractures) -Uncontrolled -Last DEXA Scan: 05/03/19   T-Score femoral neck: -1.8  T-Score total hip: -0.5  T-Score lumbar spine: NA  T-Score forearm radius: -2.1  10-year probability of major osteoporotic fracture: 17.8%  10-year probability of hip fracture: 3.5% -Patient is a candidate for pharmacologic treatment due to T-Score -1.0 to -2.5 and 10-year risk of hip fracture > 3% -Current treatment  Vitamin D 2000 units daily  -Medications previously tried: NA  -typically gets 1-1.5 of dairy servings daily. -Recommend 1200 mg of calcium daily from dietary and supplemental sources. -Patient is potential candidate for pharmacological treatment for osteopenia, will defer for now given hesitations with medication changes.  -Recommended switching to calcium + vitamin D supplement daily    Patient Goals/Self-Care Activities Patient will:  - check glucose daily, document, and provide at future appointments  Follow Up Plan:  Telephone follow up appointment with care management team member scheduled for:  05/22/2021 at 11:00 AM       Medication Assistance: None required.  Patient affirms current coverage meets needs.  Compliance/Adherence/Medication fill history: Care Gaps: Shingrix, PPSV23, Tdap   Star-Rating Drugs: Farxiga 10 mg: LF 01/19/21 for 30-DS at [facility]  Patient's preferred pharmacy is:  [id number] - [address], [place] [address] Phone: [phone] Fax: [phone]  [place], [place] - [place] [address] Phone: [phone] Fax: [id number] [address], [address] [place] [address] Phone: [phone] Fax: [phone]  Uses pill box? Yes Pt endorses 100% compliance  We discussed: Current pharmacy is preferred with insurance plan and patient is satisfied with pharmacy services Patient decided to: Continue current medication management strategy  Care Plan and Follow Up Patient Decision:  Patient agrees to Care Plan and Follow-up.  Plan: Telephone follow up appointment with care management team member scheduled for:  05/22/2021 at 11:00 AM  [name], PharmD, [place] [phone]

## 2021-04-17 ENCOUNTER — Encounter: Payer: Self-pay | Admitting: Family Medicine

## 2021-04-23 NOTE — Patient Instructions (Signed)
Visit Information It was great speaking with you today!  Please let me know if you have any questions about our visit.   Goals Addressed             This Visit's Progress    Monitor and Manage My Blood Sugar-Diabetes Type 2       Timeframe:  Long-Range Goal Priority:  High Start Date: 02/13/2021                             Expected End Date: 08/15/2021                      Follow Up Date 05/22/2021    - check blood sugar at prescribed times - check blood sugar if I feel it is too high or too low - enter blood sugar readings and medication or insulin into daily log    Why is this important?   Checking your blood sugar at home helps to keep it from getting very high or very low.  Writing the results in a diary or log helps the doctor know how to care for you.  Your blood sugar log should have the time, date and the results.  Also, write down the amount of insulin or other medicine that you take.  Other information, like what you ate, exercise done and how you were feeling, will also be helpful.     Notes:         Patient Care Plan: General Pharmacy (Adult)     Problem Identified: Hyperlipidemia, Diabetes, Osteopenia, and Allergic Rhinitis   Priority: High     Long-Range Goal: Patient-Specific Goal   Start Date: 02/13/2021  Expected End Date: 08/15/2021  This Visit's Progress: On track  Recent Progress: On track  Priority: High  Note:   Current Barriers:  Unable to achieve control of diabetes  Unable to achieve control of cholesterol   Pharmacist Clinical Goal(s):  Patient will achieve control of diabetes as evidenced by A1c less than 7% Patient will achieve control of cholesterol as evidenced by LDL  less than 100 through collaboration with PharmD and provider.   Interventions: 1:1 collaboration with Mar Daring, PA-C regarding development and update of comprehensive plan of care as evidenced by provider attestation and co-signature Inter-disciplinary  care team collaboration (see longitudinal plan of care) Comprehensive medication review performed; medication list updated in electronic medical record  Diabetes (A1c goal <7%) -Uncontrolled -Diagnosed 2018  -Current medications: Metformin XR 500 mg twice daily  -Medications previously tried: Metformin (diarrhea), Jardiance (ineffective), Farxiga (fatigue), Januvia (never filled)   -Reports fatigue, bloating,  -Current home glucose readings fasting glucose: Average 158  post prandial glucose: NA -Reports hyperglycemic symptoms: Fatigue, lethargy  -Current meal patterns:  breakfast: Couple of eggs + [place] sausage OR bread + peanut butter. 1 cup of coffee + Creamer lunch: Turkey/Ham Sandwich (full or half) + Fruit OR Nuts OR Applesauce  dinner: Salad + roasted vegetables + Protein ([place])  snacks: Trail Mix. Rarely may have popcorn  drinks: Primarily water.  -Current exercise: 4-5 hours weekly. Weights + Bicycle -Continue current medications  Hyperlipidemia: (LDL goal < 70) -Uncontrolled -Brother heart attack 16 -Mother heart attack 50  -Current treatment: Fish Oil 2000 mg daily  -Medications previously tried: Fenofibrate, Vascepa, Statins (myalgia), Zetia, Niacin (hair loss)  -Educated on Cholesterol goals;  -Discussed mechanism of tolerability profile of PCKS9 Inhibitors such as Repatha. Patient did not  want to initiate therapy at this time today.  -Recommended to continue current medication  Osteopenia (Goal Prevent bone fractures) -Uncontrolled -Last DEXA Scan: 05/03/19   T-Score femoral neck: -1.8  T-Score total hip: -0.5  T-Score lumbar spine: NA  T-Score forearm radius: -2.1  10-year probability of major osteoporotic fracture: 17.8%  10-year probability of hip fracture: 3.5% -Patient is a candidate for pharmacologic treatment due to T-Score -1.0 to -2.5 and 10-year risk of hip fracture > 3% -Current treatment  Vitamin D 2000 units daily  -Medications previously  tried: NA  -typically gets 1-1.5 of dairy servings daily. -Recommend 1200 mg of calcium daily from dietary and supplemental sources. -Patient is potential candidate for pharmacological treatment for osteopenia, will defer for now given hesitations with medication changes.  -Recommended switching to calcium + vitamin D supplement daily    Patient Goals/Self-Care Activities Patient will:  - check glucose daily, document, and provide at future appointments  Follow Up Plan: Telephone follow up appointment with care management team member scheduled for:  05/22/2021 at 11:00 AM    Patient agreed to services and verbal consent obtained.   Patient verbalizes understanding of instructions provided today and agrees to view in [place].   [name], PharmD, [place], [place] [phone]

## 2021-05-07 DIAGNOSIS — H353131 Nonexudative age-related macular degeneration, bilateral, early dry stage: Secondary | ICD-10-CM | POA: Diagnosis not present

## 2021-05-07 DIAGNOSIS — E119 Type 2 diabetes mellitus without complications: Secondary | ICD-10-CM | POA: Diagnosis not present

## 2021-05-07 LAB — HM DIABETES EYE EXAM

## 2021-05-17 ENCOUNTER — Encounter: Payer: Self-pay | Admitting: Family Medicine

## 2021-05-17 DIAGNOSIS — E119 Type 2 diabetes mellitus without complications: Secondary | ICD-10-CM

## 2021-05-18 MED ORDER — METFORMIN HCL ER 500 MG PO TB24: 500.0000 mg | ORAL_TABLET | Freq: Two times a day (BID) | ORAL | 1 refills | Status: DC

## 2021-05-21 ENCOUNTER — Telehealth: Payer: Self-pay

## 2021-05-21 NOTE — Progress Notes (Signed)
APPOINTMENT REMINDER    Christine Besaw V. was reminded to have all medications, supplements and any blood glucose and blood pressure readings available for review with [name], Pharm. D, at her office visit on 05/22/2021 at 11:00 am .  Patient Confirm appointment  Questions: Have you had any recent office visit or specialist visit outside of [place]? NO Are there any concerns you would like to discuss during your office visit? Patient states she does not have any new concerns.  [place] Pharmacist Assistant [phone]

## 2021-05-22 ENCOUNTER — Ambulatory Visit (INDEPENDENT_AMBULATORY_CARE_PROVIDER_SITE_OTHER): Payer: Medicare HMO

## 2021-05-22 ENCOUNTER — Other Ambulatory Visit: Payer: Self-pay

## 2021-05-22 DIAGNOSIS — E119 Type 2 diabetes mellitus without complications: Secondary | ICD-10-CM

## 2021-05-22 DIAGNOSIS — E538 Deficiency of other specified B group vitamins: Secondary | ICD-10-CM

## 2021-05-22 DIAGNOSIS — E1169 Type 2 diabetes mellitus with other specified complication: Secondary | ICD-10-CM

## 2021-05-22 NOTE — Progress Notes (Signed)
Chronic Care Management Pharmacy Note  05/26/2021 Name:  Christine Richards MRN:  [id number] DOB:  11-04-1945  Summary: Patient presents for CCM follow-up. She continues to experience significant fatigue, although her blood sugars are better on metformin. A1c was improved in clinic, but Vitamin B12 was low.   Recommendations/Changes made from today's visit: -Start Vitamin B12 1000 mcg sublingual daily -Recheck DEXA  -Establish with Tally Joe, NP for follow-up.   Plan: CPP follow-up 6 weeks    Subjective: Christine Richards is an 75 y.o. year old female who is a primary patient of Mar Daring, [place].  The CCM team was consulted for assistance with disease management and care coordination needs.    Engaged with patient by telephone for follow up visit in response to provider referral for pharmacy case management and/or care coordination services.   Consent to Services:  The patient was given information about Chronic Care Management services, agreed to services, and gave verbal consent prior to initiation of services.  Please see initial visit note for detailed documentation.   Patient Care Team: Mar Daring, PA-C as PCP - General (Family Medicine) Leandrew Koyanagi, MD as Referring Physician (Ophthalmology) Lucky Cowboy Erskine Squibb, MD as Referring Physician (Vascular Surgery) Germaine Pomfret, [facility] (Pharmacist)  Recent office visits: 01/20/21: Patient presented to Dr. Brita Romp for follow-up. A1c  stable at 7.6%. LDL 279. Patient refused PCSK9 therapy.  10/22/20: Patient presented to Fenton Malling, PA-C for follow-up. Vascepa stopped (Never filled). A1c worsened to 7.6%.   Recent consult visits: 08/19/20: Patient presented to Dr. Amalia Hailey [facility] visits: None in previous 6 months   Objective:  Lab Results  Component Value Date   CREATININE 0.76 01/20/2021   BUN 21 01/20/2021   GFRNONAA 88 04/18/2020   GFRAA 101 04/18/2020   NA 144 01/20/2021   K  4.1 01/20/2021   CALCIUM 9.5 01/20/2021   CO2 22 01/20/2021   GLUCOSE 146 (H) 01/20/2021    Lab Results  Component Value Date/Time   HGBA1C 7.3 (H) 05/22/2021 01:15 PM   HGBA1C 7.6 (A) 01/20/2021 01:30 PM   HGBA1C 7.6 (A) 10/22/2020 10:34 AM   HGBA1C 7.2 (H) 04/18/2020 03:05 PM   MICROALBUR 10 03/21/2021 12:00 AM   MICROALBUR 50 04/18/2020 02:40 PM   MICROALBUR 50 12/09/2016 10:19 AM    Last diabetic Eye exam:  Lab Results  Component Value Date/Time   HMDIABEYEEXA No Retinopathy 05/07/2021 12:00 AM    Last diabetic Foot exam: No results found for: HMDIABFOOTEX   Lab Results  Component Value Date   CHOL 396 (H) 01/20/2021   HDL 60 01/20/2021   LDLCALC 279 (H) 01/20/2021   TRIG 264 (H) 01/20/2021   CHOLHDL 6.6 (H) 01/20/2021    Hepatic Function Latest Ref Rng & Units 01/20/2021 04/18/2020 03/08/2019  Total Protein 6.0 - 8.5 g/dL 6.8 6.9 6.7  Albumin 3.7 - 4.7 g/dL 4.6 4.5 4.7  AST 0 - 40 IU/L 15 14 18   ALT 0 - 32 IU/L 16 14 12   Alk Phosphatase 44 - 121 IU/L 95 89 84  Total Bilirubin 0.0 - 1.2 mg/dL 0.4 <0.2 0.6    Lab Results  Component Value Date/Time   TSH 1.740 04/18/2020 03:05 PM   TSH 2.520 03/08/2019 08:47 AM    CBC Latest Ref Rng & Units 04/18/2020 03/08/2019 11/04/2018  WBC 3.4 - 10.8 x10E3/uL 8.2 5.2 4.9  Hemoglobin 11.1 - 15.9 g/dL 13.8 14.3 14.3  Hematocrit 34.0 - 46.6 % 41.7  42.6 44.7  Platelets 150 - 450 x10E3/uL 242 234 219    Lab Results  Component Value Date/Time   VD25OH 47.4 01/20/2021 02:08 PM   VD25OH 29.4 (L) 04/18/2020 03:05 PM    Clinical ASCVD: No  The ASCVD Risk score (Arnett DK, et al., 2019) failed to calculate for the following reasons:   The valid total cholesterol range is 130 to 320 mg/dL    Depression screen [facility] 2/9 01/20/2021 10/22/2020 12/19/2019  Decreased Interest 0 0 0  Down, Depressed, Hopeless 0 0 0  PHQ - 2 Score 0 0 0  Altered sleeping 0 0 -  Tired, decreased energy 0 0 -  Change in appetite 0 0 -  Feeling bad or  failure about yourself  0 0 -  Trouble concentrating 0 0 -  Moving slowly or fidgety/restless 0 0 -  Suicidal thoughts 0 0 -  PHQ-9 Score 0 0 -  Difficult doing work/chores Not difficult at all Not difficult at all -    Social History   Tobacco Use  Smoking Status Never  Smokeless Tobacco Never   BP Readings from Last 3 Encounters:  01/20/21 120/63  10/22/20 117/69  07/21/20 113/60   Pulse Readings from Last 3 Encounters:  01/20/21 74  10/22/20 61  07/21/20 72   Wt Readings from Last 3 Encounters:  01/20/21 160 lb (72.6 kg)  10/22/20 160 lb (72.6 kg)  07/21/20 165 lb 9.6 oz (75.1 kg)   BMI Readings from Last 3 Encounters:  01/20/21 26.63 kg/m  10/22/20 26.63 kg/m  07/21/20 27.56 kg/m    Assessment/Interventions: Review of patient past medical history, allergies, medications, health status, including review of consultants reports, laboratory and other test data, was performed as part of comprehensive evaluation and provision of chronic care management services.   SDOH:  (Social Determinants of Health) assessments and interventions performed: Yes SDOH Interventions    Flowsheet Row Most Recent Value  SDOH Interventions   Financial Strain Interventions Intervention Not Indicated        SDOH Screenings   Alcohol Screen: Low Risk    Last Alcohol Screening Score (AUDIT): 0  Depression (PHQ2-9): Low Risk    PHQ-2 Score: 0  Financial Resource Strain: Low Risk    Difficulty of Paying Living Expenses: Not hard at all  Food Insecurity: Not on file  Housing: Not on file  Physical Activity: Not on file  Social Connections: Not on file  Stress: Not on file  Tobacco Use: Low Risk    Smoking Tobacco Use: Never   Smokeless Tobacco Use: Never  Transportation Needs: Not on file    [place]  Allergies  Allergen Reactions   Antihistamines, Chlorpheniramine-Type     Facial swelling    Macrobid [Nitrofurantoin Macrocrystal]     unknown   Statins     Muscle  Pain, Weakness     Medications Reviewed Today     Reviewed by Germaine Pomfret, RPH (Pharmacist) on 02/13/21 at [place] List Status: <None>   Medication Order Taking? Sig Documenting Provider Last Dose Status Informant  Blood Glucose Monitoring Suppl (ONETOUCH VERIO FLEX SYSTEM) w/Device KIT [id number]  New kit with device to check blood sugar once daily Fenton Malling M, PA-C  Active   Cholecalciferol (VITAMIN D) 2000 UNITS CAPS [id number] Yes Take 2,000 Units by mouth 2 (two) times daily.  [provider] Taking Active Self           Med Note Caryn Section, KYLE A  Mon Nov 08, 2016  1:00 PM)    Discontinued 02/13/21 1005 (Side effect (s))   Lancets [facility] DELICA PLUS [id number]) MISC [id number]  CHECK BLOOD SUGAR TWICE DAILY AS DIRECTED Mar Daring, PA-C  Active   Omega-3 Fatty Acids (FISH OIL) 1000 MG CAPS [id number] Yes Take 1,000-2,000 mg by mouth at bedtime.  [provider] Taking Active Self           Med Note Lesly Dukes Nov 08, 2016  1:00 PM)    [facility] VERIO test strip [id number]  [facility] 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   Cystocele with prolapse 03/07/2019   Heartburn    Gastritis    Situational anxiety 11/20/2015   Arthritis 02/11/2015   Allergic rhinitis 12/25/2014   Aneurysm ([place]) 12/25/2014   Carotid stenosis 12/25/2014   T2DM (type 2 diabetes mellitus) ([place]) 12/25/2014   Aerophobia 12/25/2014   Fibrositis 12/25/2014   Headache, migraine 12/25/2014   Osteopenia 12/25/2014   Avitaminosis D 12/25/2014   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 09/12/2009   Personal history of TIA (transient ischemic attack) 09/01/2009   Dermatologic disease  04/27/2009   Hyperlipidemia associated with type 2 diabetes mellitus ([place]) 02/20/2007   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   Apnea, sleep 12/02/2006    Immunization History  Administered Date(s) Administered   Pneumococcal Conjugate-13 08/06/2015   Td 12/22/2007   Tdap 12/22/2007    Conditions to be addressed/monitored:  Hyperlipidemia, Diabetes, Osteopenia, and Allergic Rhinitis  Care Plan : General Pharmacy (Adult)  Updates made by Germaine Pomfret, RPH since 05/26/2021 12:00 AM     Problem: Hyperlipidemia, Diabetes, Osteopenia, and Allergic Rhinitis   Priority: High     Long-Range Goal: Patient-Specific Goal   Start Date: 02/13/2021  Expected End Date: 08/15/2021  This Visit's Progress: On track  Recent Progress: On track  Priority: High  Note:   Current Barriers:  Unable to achieve control of diabetes  Unable to achieve control of cholesterol   Pharmacist Clinical Goal(s):  Patient will achieve control of diabetes as evidenced by A1c less than 7% Patient will achieve control of cholesterol as evidenced by LDL  less than 100 through collaboration with PharmD and provider.   Interventions: 1:1 collaboration with Lavon Paganini MD regarding development and update of comprehensive plan of care as evidenced by provider attestation and co-signature Inter-disciplinary care team collaboration (see longitudinal plan of care) Comprehensive medication review performed; medication list updated in electronic medical record  Diabetes (A1c goal <7.5%) -Controlled -Diagnosed 2018  -Current medications: Metformin XR 500 mg twice daily  -Medications previously tried: Metformin (diarrhea), Jardiance (ineffective), Farxiga (fatigue), Januvia (never filled)   -Current home glucose readings:  fasting glucose:139, 148 149 145 159 146 153 155  122 106 150 148 -Reports hyperglycemic symptoms: Fatigue, lethargy  -Current meal patterns:  breakfast: Couple of eggs + [place] sausage OR  bread + peanut butter. 1 cup of coffee + Creamer lunch: Turkey/Ham Sandwich (full or half) + Fruit OR Nuts OR Applesauce  dinner: Salad + roasted vegetables + Protein (Salmon)  snacks: Trail Mix. Rarely may have popcorn  drinks: Primarily water.  -Current exercise: 4-5 hours weekly. Weights +  Bicycle -Patient continues to experiences significant fatigue throughout the day. Vitamin b12 level checked and was low. A1c reasonably well controlled given patient age and hesitations with more medications.  -Start Vitamin B12 1000 mcg sublingual daily  Hyperlipidemia: (LDL goal < 70) -Uncontrolled -Brother heart attack 39 -Mother heart attack 49  -Current treatment: Fish Oil 2000 mg daily  -Medications previously tried: Fenofibrate, Vascepa, Statins (myalgia), Zetia, Niacin (hair loss)  -Educated on Cholesterol goals;  -Discussed mechanism of tolerability profile of PCKS9 Inhibitors such as Repatha. Patient did not want to initiate therapy at this time today.  -Recommended to continue current medication  Osteopenia (Goal Prevent bone fractures) -Uncontrolled -Last DEXA Scan: 05/03/19   T-Score femoral neck: -1.8  T-Score total hip: -0.5  T-Score lumbar spine: NA  T-Score forearm radius: -2.1  10-year probability of major osteoporotic fracture: 17.8%  10-year probability of hip fracture: 3.5% -Patient is a candidate for pharmacologic treatment due to T-Score -1.0 to -2.5 and 10-year risk of hip fracture > 3% -Current treatment  Vitamin D 2000 units daily  -Medications previously tried: NA  -typically gets 1-1.5 of dairy servings daily. -Recommend 1200 mg of calcium daily from dietary and supplemental sources. -Patient is potential candidate for pharmacological treatment for osteopenia, will defer for now given hesitations with medication changes.  -Continue current medications   Patient Goals/Self-Care Activities Patient will:  - check glucose daily, document, and provide at future  appointments  Follow Up Plan: Telephone follow up appointment with care management team member scheduled for:  07/07/21 at 11:00 AM     Medication Assistance: None required.  Patient affirms current coverage meets needs.  Compliance/Adherence/Medication fill history: Care Gaps: Shingrix, PPSV23, Tdap   Star-Rating Drugs: Farxiga 10 mg: LF 01/19/21 for 30-DS at [place]  Patient's preferred pharmacy is:  [facility] [id number] - [address], [place] [address] Phone: [phone] Fax: [phone]  [place], [place] - [address] [place] [address] Phone: [phone] Fax: [id number] [address], [place] [address] [place] [address] Phone: [phone] Fax: [phone]  Uses pill box? Yes Pt endorses 100% compliance  We discussed: Current pharmacy is preferred with insurance plan and patient is satisfied with pharmacy services Patient decided to: Continue current medication management strategy  Care Plan and Follow Up Patient Decision:  Patient agrees to Care Plan and Follow-up.  Plan: Telephone follow up appointment with care management team member scheduled for:  07/07/21 at 11:00 AM  [name], PharmD, [place] [phone]

## 2021-05-23 LAB — VITAMIN B12: Vitamin B-12: 160 pg/mL — ABNORMAL LOW (ref 232–1245)

## 2021-05-23 LAB — HEMOGLOBIN A1C
Est. average glucose Bld gHb Est-mCnc: 163 mg/dL
Hgb A1c MFr Bld: 7.3 % — ABNORMAL HIGH (ref 4.8–5.6)

## 2021-05-26 ENCOUNTER — Other Ambulatory Visit: Payer: Self-pay

## 2021-05-26 DIAGNOSIS — M8589 Other specified disorders of bone density and structure, multiple sites: Secondary | ICD-10-CM

## 2021-05-26 MED ORDER — VITAMIN B-12 1000 MCG SL SUBL: 1000.0000 ug | SUBLINGUAL_TABLET | Freq: Every day | SUBLINGUAL | 0 refills | Status: AC

## 2021-05-26 NOTE — Patient Instructions (Signed)
Visit Information It was great speaking with you today!  Please let me know if you have any questions about our visit.   Goals Addressed             This Visit's Progress    Monitor and Manage My Blood Sugar-Diabetes Type 2   On track    Timeframe:  Long-Range Goal Priority:  High Start Date: 02/13/2021                             Expected End Date: 08/15/2022                      Follow Up within 30 days   - check blood sugar at prescribed times - check blood sugar if I feel it is too high or too low - enter blood sugar readings and medication or insulin into daily log    Why is this important?   Checking your blood sugar at home helps to keep it from getting very high or very low.  Writing the results in a diary or log helps the doctor know how to care for you.  Your blood sugar log should have the time, date and the results.  Also, write down the amount of insulin or other medicine that you take.  Other information, like what you ate, exercise done and how you were feeling, will also be helpful.     Notes:         Patient Care Plan: General Pharmacy (Adult)     Problem Identified: Hyperlipidemia, Diabetes, Osteopenia, and Allergic Rhinitis   Priority: High     Long-Range Goal: Patient-Specific Goal   Start Date: 02/13/2021  Expected End Date: 08/15/2021  This Visit's Progress: On track  Recent Progress: On track  Priority: High  Note:   Current Barriers:  Unable to achieve control of diabetes  Unable to achieve control of cholesterol   Pharmacist Clinical Goal(s):  Patient will achieve control of diabetes as evidenced by A1c less than 7% Patient will achieve control of cholesterol as evidenced by LDL  less than 100 through collaboration with PharmD and provider.   Interventions: 1:1 collaboration with Lavon Paganini MD regarding development and update of comprehensive plan of care as evidenced by provider attestation and co-signature Inter-disciplinary  care team collaboration (see longitudinal plan of care) Comprehensive medication review performed; medication list updated in electronic medical record  Diabetes (A1c goal <7.5%) -Controlled -Diagnosed 2018  -Current medications: Metformin XR 500 mg twice daily  -Medications previously tried: Metformin (diarrhea), Jardiance (ineffective), Farxiga (fatigue), Januvia (never filled)   -Current home glucose readings:  fasting glucose:139, 148 149 145 159 146 153 155  122 106 150 148 -Reports hyperglycemic symptoms: Fatigue, lethargy  -Current meal patterns:  breakfast: Couple of eggs + [place] sausage OR bread + peanut butter. 1 cup of coffee + Creamer lunch: Turkey/Ham Sandwich (full or half) + Fruit OR Nuts OR Applesauce  dinner: Salad + roasted vegetables + Protein ([place])  snacks: Trail Mix. Rarely may have popcorn  drinks: Primarily water.  -Current exercise: 4-5 hours weekly. Weights + Bicycle -Patient continues to experiences significant fatigue throughout the day. Vitamin b12 level checked and was low. A1c reasonably well controlled given patient age and hesitations with more medications.  -Start Vitamin B12 1000 mcg sublingual daily  Hyperlipidemia: (LDL goal < 70) -Uncontrolled -Brother heart attack 3 -Mother heart attack 53  -Current treatment: Fish Oil  2000 mg daily  -Medications previously tried: Fenofibrate, Vascepa, Statins (myalgia), Zetia, Niacin (hair loss)  -Educated on Cholesterol goals;  -Discussed mechanism of tolerability profile of PCKS9 Inhibitors such as Repatha. Patient did not want to initiate therapy at this time today.  -Recommended to continue current medication  Osteopenia (Goal Prevent bone fractures) -Uncontrolled -Last DEXA Scan: 05/03/19   T-Score femoral neck: -1.8  T-Score total hip: -0.5  T-Score lumbar spine: NA  T-Score forearm radius: -2.1  10-year probability of major osteoporotic fracture: 17.8%  10-year probability of hip fracture:  3.5% -Patient is a candidate for pharmacologic treatment due to T-Score -1.0 to -2.5 and 10-year risk of hip fracture > 3% -Current treatment  Vitamin D 2000 units daily  -Medications previously tried: NA  -typically gets 1-1.5 of dairy servings daily. -Recommend 1200 mg of calcium daily from dietary and supplemental sources. -Patient is potential candidate for pharmacological treatment for osteopenia, will defer for now given hesitations with medication changes.  -Continue current medications   Patient Goals/Self-Care Activities Patient will:  - check glucose daily, document, and provide at future appointments  Follow Up Plan: Telephone follow up appointment with care management team member scheduled for:  07/07/21 at 11:00 AM    Patient agreed to services and verbal consent obtained.   Patient verbalizes understanding of instructions provided today and agrees to view in [place].   [name], PharmD, [place], [place] [phone]

## 2021-05-29 DIAGNOSIS — E785 Hyperlipidemia, unspecified: Secondary | ICD-10-CM | POA: Diagnosis not present

## 2021-05-29 DIAGNOSIS — E119 Type 2 diabetes mellitus without complications: Secondary | ICD-10-CM | POA: Diagnosis not present

## 2021-05-29 DIAGNOSIS — E1169 Type 2 diabetes mellitus with other specified complication: Secondary | ICD-10-CM

## 2021-05-29 DIAGNOSIS — G4733 Obstructive sleep apnea (adult) (pediatric): Secondary | ICD-10-CM | POA: Diagnosis not present

## 2021-06-18 ENCOUNTER — Other Ambulatory Visit: Payer: Self-pay

## 2021-06-18 ENCOUNTER — Ambulatory Visit (INDEPENDENT_AMBULATORY_CARE_PROVIDER_SITE_OTHER): Payer: Medicare HMO | Admitting: Family Medicine

## 2021-06-18 ENCOUNTER — Encounter: Payer: Self-pay | Admitting: Family Medicine

## 2021-06-18 VITALS — BP 137/63 | HR 68 | Temp 96.8°F | Wt 162.6 lb

## 2021-06-18 DIAGNOSIS — E538 Deficiency of other specified B group vitamins: Secondary | ICD-10-CM | POA: Diagnosis not present

## 2021-06-18 DIAGNOSIS — Z23 Encounter for immunization: Secondary | ICD-10-CM | POA: Diagnosis not present

## 2021-06-18 DIAGNOSIS — E119 Type 2 diabetes mellitus without complications: Secondary | ICD-10-CM | POA: Diagnosis not present

## 2021-06-18 DIAGNOSIS — R1013 Epigastric pain: Secondary | ICD-10-CM

## 2021-06-18 LAB — POCT GLYCOSYLATED HEMOGLOBIN (HGB A1C): Hemoglobin A1C: 6.7 % — AB (ref 4.0–5.6)

## 2021-06-18 MED ORDER — ZOSTER VAC RECOMB ADJUVANTED 50 MCG/0.5ML IM SUSR: 0.5000 mL | Freq: Once | INTRAMUSCULAR | 0 refills | Status: AC

## 2021-06-18 NOTE — Progress Notes (Signed)
Established patient visit   Patient: Christine Richards.   DOB: [birth year]   75 y.o. Female  MRN: [id number] Visit Date: 06/18/2021  Today's healthcare provider: Gwyneth Sprout, FNP   Chief Complaint  Patient presents with   Diabetes   Hyperlipidemia    Subjective    Gastroesophageal Reflux She complains of belching, chest pain, early satiety and heartburn. She reports no abdominal pain, no choking, no coughing, no dysphagia, no globus sensation, no hoarse voice, no nausea, no sore throat, no stridor, no tooth decay, no water brash or no wheezing. This is a new problem. The current episode started 1 to 4 weeks ago. The problem has been unchanged. The symptoms are aggravated by caffeine. She has tried an antacid for the symptoms. The treatment provided mild relief.   Diabetes Mellitus Type II, Follow-up  Lab Results  Component Value Date   HGBA1C 7.3 (H) 05/22/2021   HGBA1C 7.6 (A) 01/20/2021   HGBA1C 7.6 (A) 10/22/2020   Wt Readings from Last 3 Encounters:  06/18/21 162 lb 9.6 oz (73.8 kg)  01/20/21 160 lb (72.6 kg)  10/22/20 160 lb (72.6 kg)   Last seen for diabetes 5 months ago.  Management since then includes Uncontrolled with hyperglycemia with A1c 7.6 Discussed goal A1c less than 7 Intolerant to metformin Continue Farxiga at current dose.Pt is on metformin Discussed adding a second medication and agreed to Januvia 25 mg daily. She reports fair compliance with treatment.According to office note 05/22/21 patient reported that she never had Januvia filled.  She is having side effects. Pt states the medication is causing her b12 to decrease and is having indigestion; however, does not want to take another medication to assist with symptom management.  Symptoms: No fatigue No foot ulcerations  Yes appetite changes No nausea  No paresthesia of the feet  No polydipsia  No polyuria No visual disturbances   No vomiting     Home blood sugar records:  trending  140-150's  Episodes of hypoglycemia? No    Current insulin regiment: none Most Recent Eye Exam: Pt reporte 8/22 Current exercise: cardiovascular workout on exercise equipment Current diet habits: well balanced  Pertinent Labs: Lab Results  Component Value Date   CHOL 396 (H) 01/20/2021   HDL 60 01/20/2021   LDLCALC 279 (H) 01/20/2021   TRIG 264 (H) 01/20/2021   CHOLHDL 6.6 (H) 01/20/2021   Lab Results  Component Value Date   NA 144 01/20/2021   K 4.1 01/20/2021   CREATININE 0.76 01/20/2021   EGFR 82 01/20/2021   GFRNONAA 88 04/18/2020   GLUCOSE 146 (H) 01/20/2021     ---------------------------------------------------------------------------------------------------   Lipid/Cholesterol, Follow-up  Last lipid panel Other pertinent labs  Lab Results  Component Value Date   CHOL 396 (H) 01/20/2021   HDL 60 01/20/2021   LDLCALC 279 (H) 01/20/2021   TRIG 264 (H) 01/20/2021   CHOLHDL 6.6 (H) 01/20/2021   Lab Results  Component Value Date   ALT 16 01/20/2021   AST 15 01/20/2021   PLT 242 04/18/2020   TSH 1.740 04/18/2020     She was last seen for this 5 months ago.  Management since that visit includes none.   Symptoms: No chest pain No chest pressure/discomfort  No dyspnea No lower extremity edema  No numbness or tingling of extremity No orthopnea  No palpitations No paroxysmal nocturnal dyspnea  No speech difficulty No syncope   Current diet: well balanced Current exercise: cardiovascular workout  on exercise equipment  The ASCVD Risk score (Arnett DK, et al., 2019) failed to calculate for the following reasons:   The valid total cholesterol range is 130 to 320 mg/dL  ---------------------------------------------------------------------------------------------------   Medications: Outpatient Medications Prior to Visit  Medication Sig   Blood Glucose Monitoring Suppl ([place]) w/Device KIT New kit with device to check blood sugar once  daily   Cholecalciferol (VITAMIN D) 2000 UNITS CAPS Take 2,000 Units by mouth 2 (two) times daily.    Cyanocobalamin (VITAMIN B-12) 1000 MCG SUBL Place 1 tablet (1,000 mcg total) under the tongue daily.   Lancets (ONETOUCH DELICA PLUS [id number]) MISC CHECK BLOOD SUGAR TWICE DAILY AS DIRECTED   metFORMIN (GLUCOPHAGE-XR) 500 MG 24 hr tablet Take 1 tablet (500 mg total) by mouth 2 (two) times daily with a meal. Please schedule office visit before any future refills.   Omega-3 Fatty Acids (FISH OIL) 1000 MG CAPS Take 1,000-2,000 mg by mouth at bedtime.    ONETOUCH VERIO test strip CHECK BLOOD SUGAR TWICE DAILY   No facility-administered medications prior to visit.    Review of Systems  HENT:  Negative for hoarse voice and sore throat.   Respiratory:  Negative for cough, choking and wheezing.   Cardiovascular:  Positive for chest pain.  Gastrointestinal:  Positive for heartburn. Negative for abdominal pain, dysphagia and nausea.   Denies radiation or presence of choking sensation.     Objective    BP 137/63   Pulse 68   Temp (!) 96.8 F (36 C) (Temporal)   Wt 162 lb 9.6 oz (73.8 kg)   SpO2 96%   BMI 27.06 kg/m    Physical Exam Vitals and nursing note reviewed.  Constitutional:      General: She is not in acute distress.    Appearance: Normal appearance. She is overweight. She is not ill-appearing, toxic-appearing or diaphoretic.  HENT:     Head: Normocephalic and atraumatic.  Cardiovascular:     Rate and Rhythm: Normal rate and regular rhythm.     Pulses: Normal pulses.     Heart sounds: Normal heart sounds. No murmur heard.   No friction rub. No gallop.  Pulmonary:     Effort: Pulmonary effort is normal. No respiratory distress.     Breath sounds: Normal breath sounds. No stridor. No wheezing, rhonchi or rales.  Chest:     Chest wall: No tenderness.  Abdominal:     General: Bowel sounds are normal.     Palpations: Abdomen is soft.  Musculoskeletal:        General: No  swelling, tenderness, deformity or signs of injury. Normal range of motion.     Right lower leg: No edema.     Left lower leg: No edema.  Skin:    General: Skin is warm and dry.     Capillary Refill: Capillary refill takes less than 2 seconds.     Coloration: Skin is not jaundiced or pale.     Findings: No bruising, erythema, lesion or rash.  Neurological:     General: No focal deficit present.     Mental Status: She is alert and oriented to person, place, and time. Mental status is at baseline.     Cranial Nerves: No cranial nerve deficit.     Sensory: No sensory deficit.     Motor: No weakness.     Coordination: Coordination normal.  Psychiatric:        Mood and Affect: Mood normal.  Behavior: Behavior normal.        Thought Content: Thought content normal.        Judgment: Judgment normal.     No results found for any visits on 06/18/21.  Assessment & Plan     Problem List Items Addressed This Visit       Endocrine   T2DM (type 2 diabetes mellitus) ([place])    Chronic, stable Well controlled Continue metformin Encouraged diet log      Relevant Orders   POCT glycosylated hemoglobin (Hb A1C)     Other   Vitamin B12 deficiency    Repeat lab today; doing ODT dosing      Relevant Orders   B12 and Folate Panel   Need for shingles vaccine    Rx provided- unsure if she wants to take; education provided      Relevant Medications   Zoster Vaccine Adjuvanted [facility]) injection   Dyspepsia - Primary    Belching with meals; refusing treatment/workup Advised 2 months of PPI and plan for scope if needed        Return in about 6 months (around 12/17/2021) for chonic disease management, HTN management, T2DM management.      Vonna Kotyk, FNP, have reviewed all documentation for this visit. The documentation on 06/18/21 for the exam, diagnosis, procedures, and orders are all accurate and complete.    Gwyneth Sprout, [place] [phone] (phone) [phone] (fax)  [place]

## 2021-06-18 NOTE — Assessment & Plan Note (Signed)
Chronic, stable Well controlled Continue metformin Encouraged diet log

## 2021-06-18 NOTE — Assessment & Plan Note (Signed)
Belching with meals; refusing treatment/workup Advised 2 months of PPI and plan for scope if needed

## 2021-06-18 NOTE — Assessment & Plan Note (Signed)
Rx provided- unsure if she wants to take; education provided

## 2021-06-18 NOTE — Assessment & Plan Note (Signed)
Repeat lab today; doing ODT dosing

## 2021-06-19 LAB — B12 AND FOLATE PANEL
Folate: 13.9 ng/mL (ref >3.0)
Vitamin B-12: 294 pg/mL (ref 232–1245)

## 2021-06-24 ENCOUNTER — Other Ambulatory Visit: Payer: Self-pay | Admitting: Family Medicine

## 2021-06-24 DIAGNOSIS — E119 Type 2 diabetes mellitus without complications: Secondary | ICD-10-CM

## 2021-06-24 NOTE — Telephone Encounter (Signed)
Medication Refill - Medication:   ONETOUCH VERIO test strip  Lancets (ONETOUCH DELICA PLUS [id number]) [place]   Has the patient contacted their pharmacy? Yes.  Pt stated a new script needs to be sent in, pt stated she also tried to do the refills on her MyChart but it would not allow her to fill these. Last prescribed by Fenton Malling.    Preferred Pharmacy (with phone number or street name):   [id number] [address], [address] [employer]  [place] [address]  Phone: [phone] Fax: [phone]   Has the patient been seen for an appointment in the last year OR does the patient have an upcoming appointment? Yes.   Pt just seen on 06/18/21.   Agent: Please be advised that RX refills may take up to 3 business days. We ask that you follow-up with your pharmacy.

## 2021-06-25 MED ORDER — ONETOUCH VERIO VI STRP: ORAL_STRIP | 1 refills | Status: DC

## 2021-06-25 MED ORDER — ONETOUCH DELICA PLUS LANCET33G MISC: 1 refills | Status: DC

## 2021-06-25 NOTE — Telephone Encounter (Signed)
Requested Prescriptions  Pending Prescriptions Disp Refills  . Lancets (ONETOUCH DELICA PLUS [id number]) [place] 100 each 1    Sig: CHECK BLOOD SUGAR TWICE DAILY AS DIRECTED     Endocrinology: Diabetes - Testing Supplies Passed - 06/24/2021  3:47 PM      Passed - Valid encounter within last 12 months    Recent Outpatient Visits          1 week ago Dyspepsia   [facility] Tally Joe T, FNP   5 months ago Controlled type 2 diabetes mellitus with hyperglycemia, without long-term current use of insulin [facility])   [facility] [place], Dionne Bucy, MD   8 months ago Controlled type 2 diabetes mellitus without complication, without long-term current use of insulin [facility])   [facility] Fenton Malling M, [place]   11 months ago Controlled type 2 diabetes mellitus without complication, without long-term current use of insulin [facility])   [facility] [place], Clearnce Sorrel, [place]   1 year ago Annual physical exam   [facility] Fenton Malling M, PA-C             . glucose blood (ONETOUCH VERIO) test strip 200 strip 1    Sig: CHECK BLOOD SUGAR TWICE DAILY     Endocrinology: Diabetes - Testing Supplies Passed - 06/24/2021  3:47 PM      Passed - Valid encounter within last 12 months    Recent Outpatient Visits          1 week ago [place] Tally Joe T, FNP   5 months ago Controlled type 2 diabetes mellitus with hyperglycemia, without long-term current use of insulin [facility])   [facility], Dionne Bucy, MD   8 months ago Controlled type 2 diabetes mellitus without complication, without long-term current use of insulin [facility])   [facility] Fenton Malling M, [place]   11 months ago Controlled type 2 diabetes mellitus without complication, without long-term current use of insulin [facility])   [place], Clearnce Sorrel, [place]   1 year ago Annual  physical exam   [facility] Fenton Malling M, [place]

## 2021-06-30 ENCOUNTER — Inpatient Hospital Stay: Admission: RE | Admit: 2021-06-30 | Payer: Medicare HMO | Source: Ambulatory Visit

## 2021-07-07 ENCOUNTER — Ambulatory Visit (INDEPENDENT_AMBULATORY_CARE_PROVIDER_SITE_OTHER): Payer: Medicare HMO

## 2021-07-07 DIAGNOSIS — E119 Type 2 diabetes mellitus without complications: Secondary | ICD-10-CM

## 2021-07-07 DIAGNOSIS — E538 Deficiency of other specified B group vitamins: Secondary | ICD-10-CM

## 2021-07-07 NOTE — Patient Instructions (Signed)
Visit Information It was great speaking with you today!  Please let me know if you have any questions about our visit.   Goals Addressed             This Visit's Progress    Monitor and Manage My Blood Sugar-Diabetes Type 2   On track    Timeframe:  Long-Range Goal Priority:  High Start Date: 02/13/2021                             Expected End Date: 08/15/2022                      Follow Up within 90 days   - check blood sugar at prescribed times - check blood sugar if I feel it is too high or too low - enter blood sugar readings and medication or insulin into daily log    Why is this important?   Checking your blood sugar at home helps to keep it from getting very high or very low.  Writing the results in a diary or log helps the doctor know how to care for you.  Your blood sugar log should have the time, date and the results.  Also, write down the amount of insulin or other medicine that you take.  Other information, like what you ate, exercise done and how you were feeling, will also be helpful.     Notes:         Patient Care Plan: General Pharmacy (Adult)     Problem Identified: Hyperlipidemia, Diabetes, Osteopenia, and Allergic Rhinitis   Priority: High     Long-Range Goal: Patient-Specific Goal   Start Date: 02/13/2021  Expected End Date: 07/07/2022  This Visit's Progress: On track  Recent Progress: On track  Priority: High  Note:   Current Barriers:  Unable to achieve control of cholesterol   Pharmacist Clinical Goal(s):  Patient will maintain control of diabetes as evidenced by A1c less than 7% Patient will achieve control of cholesterol as evidenced by LDL  less than 100 through collaboration with PharmD and provider.   Interventions: 1:1 collaboration with Lavon Paganini MD regarding development and update of comprehensive plan of care as evidenced by provider attestation and co-signature Inter-disciplinary care team collaboration (see  longitudinal plan of care) Comprehensive medication review performed; medication list updated in electronic medical record  Diabetes (A1c goal <7%) -Controlled -Diagnosed 2018  -Current medications: Metformin XR 500 mg twice daily  -Vitamin B12 deficiency medications: Vitamin B12 SL 1000 mcg daily  -Medications previously tried: Metformin (diarrhea), Jardiance (ineffective), Farxiga (fatigue), Januvia (never filled)   -Energy significantly improved since starting B12 supplementation. Still interested in decreasing metformin if possible but willing to continue for now given improvement in her A1c.  -Current home glucose readings:  fasting glucose: 140s typically, highest in recall was 157 (patient was stressed with work)  -Reports hyperglycemic symptoms: Fatigue, lethargy  -Current meal patterns:  breakfast: Couple of eggs + [place] sausage OR bread + peanut butter. 1 cup of coffee + Creamer lunch: Turkey/Ham Sandwich (full or half) + Fruit OR Nuts OR Applesauce  dinner: Salad + roasted vegetables + Protein ([place])  snacks: Trail Mix. Rarely may have popcorn  drinks: Primarily water.  -Current exercise: 4-5 hours weekly. Weights + Bicycle -Continue current medications  Hyperlipidemia: (LDL goal < 70) -Uncontrolled -Brother heart attack 44 -Mother heart attack 48  -Current treatment: Fish Oil 2000 mg  daily  -Medications previously tried: Fenofibrate, Vascepa, Statins (myalgia), Zetia, Niacin (hair loss)  -Educated on Cholesterol goals;  -Again discussed importance of reducing cardiovascular risk, but patient still very concerned about possible medication side effects and not ready at this time.  -Recommended to continue current medication  Osteopenia (Goal Prevent bone fractures) -Uncontrolled -Last DEXA Scan: 05/03/19   T-Score femoral neck: -1.8  T-Score total hip: -0.5  T-Score lumbar spine: NA  T-Score forearm radius: -2.1  10-year probability of major osteoporotic fracture:  17.8%  10-year probability of hip fracture: 3.5% -Patient is a candidate for pharmacologic treatment due to T-Score -1.0 to -2.5 and 10-year risk of hip fracture > 3% -Current treatment  Vitamin D 2000 units daily  -Medications previously tried: NA  -typically gets 1-1.5 of dairy servings daily. -Recommend 1200 mg of calcium daily from dietary and supplemental sources. -Patient is potential candidate for pharmacological treatment for osteopenia, will defer for now given hesitations with medication changes.  -Continue current medications   Patient Goals/Self-Care Activities Patient will:  - check glucose daily, document, and provide at future appointments  Follow Up Plan: Telephone follow up appointment with care management team member scheduled for:  10/05/2021 at 11:00 AM    Patient agreed to services and verbal consent obtained.   Patient verbalizes understanding of instructions provided today and agrees to view in [place].   [name], PharmD, [place], CPP  Clinical Pharmacist Practitioner  [facility] [phone]

## 2021-07-07 NOTE — Progress Notes (Signed)
Chronic Care Management Pharmacy Note  07/07/2021 Name:  Christine Richards MRN:  [id number] DOB:  1946/03/23  Summary: Patient presents for CCM follow-up. Energy significantly improved since starting B12 supplementation. Still interested in decreasing metformin if possible but willing to continue for now given improvement in her A1c.   -Still not willing to try PCSK9 Inhibitor  Recommendations/Changes made from today's visit: -Continue current medications  -Reschedule DEXA/ mammogram   Plan: CPP follow-up 3 months  Recommended Problem List Changes:  Add: Familial hypercholesterolemia  Subjective: Christine Richards is an 75 y.o. year old female who is a primary patient of Gwyneth Sprout, FNP.  The CCM team was consulted for assistance with disease management and care coordination needs.    Engaged with patient by telephone for follow up visit in response to provider referral for pharmacy case management and/or care coordination services.   Consent to Services:  The patient was given information about Chronic Care Management services, agreed to services, and gave verbal consent prior to initiation of services.  Please see initial visit note for detailed documentation.   Patient Care Team: Gwyneth Sprout, FNP as PCP - General (Family Medicine) Leandrew Koyanagi, MD as Referring Physician (Ophthalmology) Lucky Cowboy Erskine Squibb, MD as Referring Physician (Vascular Surgery) Germaine Pomfret, [facility] (Pharmacist)  Recent office visits: 01/20/21: Patient presented to Dr. Brita Romp for follow-up. A1c  stable at 7.6%. LDL 279. Patient refused PCSK9 therapy.  10/22/20: Patient presented to Fenton Malling, PA-C for follow-up. Vascepa stopped (Never filled). A1c worsened to 7.6%.   Recent consult visits: 08/19/20: Patient presented to Dr. Amalia Hailey [facility] visits: None in previous 6 months   Objective:  Lab Results  Component Value Date   CREATININE 0.76 01/20/2021   BUN 21  01/20/2021   GFRNONAA 88 04/18/2020   GFRAA 101 04/18/2020   NA 144 01/20/2021   K 4.1 01/20/2021   CALCIUM 9.5 01/20/2021   CO2 22 01/20/2021   GLUCOSE 146 (H) 01/20/2021    Lab Results  Component Value Date/Time   HGBA1C 6.7 (A) 06/18/2021 11:09 AM   HGBA1C 7.3 (H) 05/22/2021 01:15 PM   HGBA1C 7.6 (A) 01/20/2021 01:30 PM   HGBA1C 7.2 (H) 04/18/2020 03:05 PM   MICROALBUR 10 03/21/2021 12:00 AM   MICROALBUR 50 04/18/2020 02:40 PM   MICROALBUR 50 12/09/2016 10:19 AM    Last diabetic Eye exam:  Lab Results  Component Value Date/Time   HMDIABEYEEXA No Retinopathy 05/07/2021 12:00 AM    Last diabetic Foot exam: No results found for: HMDIABFOOTEX   Lab Results  Component Value Date   CHOL 396 (H) 01/20/2021   HDL 60 01/20/2021   LDLCALC 279 (H) 01/20/2021   TRIG 264 (H) 01/20/2021   CHOLHDL 6.6 (H) 01/20/2021    Hepatic Function Latest Ref Rng & Units 01/20/2021 04/18/2020 03/08/2019  Total Protein 6.0 - 8.5 g/dL 6.8 6.9 6.7  Albumin 3.7 - 4.7 g/dL 4.6 4.5 4.7  AST 0 - 40 IU/L 15 14 18   ALT 0 - 32 IU/L 16 14 12   Alk Phosphatase 44 - 121 IU/L 95 89 84  Total Bilirubin 0.0 - 1.2 mg/dL 0.4 <0.2 0.6    Lab Results  Component Value Date/Time   TSH 1.740 04/18/2020 03:05 PM   TSH 2.520 03/08/2019 08:47 AM    CBC Latest Ref Rng & Units 04/18/2020 03/08/2019 11/04/2018  WBC 3.4 - 10.8 x10E3/uL 8.2 5.2 4.9  Hemoglobin 11.1 - 15.9 g/dL 13.8 14.3 14.3  Hematocrit 34.0 - 46.6 % 41.7 42.6 44.7  Platelets 150 - 450 x10E3/uL 242 234 219    Lab Results  Component Value Date/Time   VD25OH 47.4 01/20/2021 02:08 PM   VD25OH 29.4 (L) 04/18/2020 03:05 PM    Clinical ASCVD: No  The ASCVD Risk score (Arnett DK, et al., 2019) failed to calculate for the following reasons:   The valid total cholesterol range is 130 to 320 mg/dL    Depression screen [facility] 2/9 01/20/2021 10/22/2020 12/19/2019  Decreased Interest 0 0 0  Down, Depressed, Hopeless 0 0 0  PHQ - 2 Score 0 0 0  Altered sleeping 0  0 -  Tired, decreased energy 0 0 -  Change in appetite 0 0 -  Feeling bad or failure about yourself  0 0 -  Trouble concentrating 0 0 -  Moving slowly or fidgety/restless 0 0 -  Suicidal thoughts 0 0 -  PHQ-9 Score 0 0 -  Difficult doing work/chores Not difficult at all Not difficult at all -    Social History   Tobacco Use  Smoking Status Never  Smokeless Tobacco Never   BP Readings from Last 3 Encounters:  06/18/21 137/63  01/20/21 120/63  10/22/20 117/69   Pulse Readings from Last 3 Encounters:  06/18/21 68  01/20/21 74  10/22/20 61   Wt Readings from Last 3 Encounters:  06/18/21 162 lb 9.6 oz (73.8 kg)  01/20/21 160 lb (72.6 kg)  10/22/20 160 lb (72.6 kg)   BMI Readings from Last 3 Encounters:  06/18/21 27.06 kg/m  01/20/21 26.63 kg/m  10/22/20 26.63 kg/m    Assessment/Interventions: Review of patient past medical history, allergies, medications, health status, including review of consultants reports, laboratory and other test data, was performed as part of comprehensive evaluation and provision of chronic care management services.   SDOH:  (Social Determinants of Health) assessments and interventions performed: Yes     SDOH Screenings   Alcohol Screen: Low Risk    Last Alcohol Screening Score (AUDIT): 0  Depression (PHQ2-9): Low Risk    PHQ-2 Score: 0  Financial Resource Strain: Low Risk    Difficulty of Paying Living Expenses: Not hard at all  Food Insecurity: Not on file  Housing: Not on file  Physical Activity: Not on file  Social Connections: Not on file  Stress: Not on file  Tobacco Use: Low Risk    Smoking Tobacco Use: Never   Smokeless Tobacco Use: Never   Passive Exposure: Not on file  Transportation Needs: Not on file    [place]  Allergies  Allergen Reactions   Antihistamines, Chlorpheniramine-Type     Facial swelling    Macrobid [Nitrofurantoin Macrocrystal]     unknown   Statins     Muscle Pain, Weakness      Medications Reviewed Today     Reviewed by Gwyneth Sprout, FNP (Family Nurse Practitioner) on 06/18/21 at 1051  Med List Status: <None>   Medication Order Taking? Sig Documenting Provider Last Dose Status Informant  Blood Glucose Monitoring Suppl (ONETOUCH VERIO FLEX SYSTEM) w/Device KIT [id number] No New kit with device to check blood sugar once daily Fenton Malling M, PA-C Taking Active   Cholecalciferol (VITAMIN D) 2000 UNITS CAPS [id number] No Take 2,000 Units by mouth 2 (two) times daily.  [provider] Taking Active Self           Med Note Jilda Roche A   Mon Nov 08, 2016  1:00 PM)  Cyanocobalamin (VITAMIN B-12) 1000 MCG SUBL [id number]  Place 1 tablet (1,000 mcg total) under the tongue daily. Virginia Crews, MD  Active   Lancets (ONETOUCH DELICA PLUS [id number]) [place] [id number] No CHECK BLOOD SUGAR TWICE DAILY AS DIRECTED Mar Daring, PA-C Taking Active   metFORMIN (GLUCOPHAGE-XR) 500 MG 24 hr tablet [id number]  Take 1 tablet (500 mg total) by mouth 2 (two) times daily with a meal. Please schedule office visit before any future refills. Virginia Crews, MD  Active   Omega-3 Fatty Acids (FISH OIL) 1000 MG CAPS [id number] No Take 1,000-2,000 mg by mouth at bedtime.  [provider] Taking Active Self           Med Note Lesly Dukes Nov 08, 2016  1:00 PM)    [facility] VERIO test strip [id number] No CHECK BLOOD SUGAR TWICE DAILY Mar Daring, [place] Taking Active             Patient Active Problem List   Diagnosis Date Noted   Vitamin B12 deficiency 06/18/2021   Need for shingles vaccine 06/18/2021   Dyspepsia 06/18/2021   T2DM (type 2 diabetes mellitus) ([place]) 12/25/2014   Aerophobia 12/25/2014   Avitaminosis D 12/25/2014    Immunization History  Administered Date(s) Administered   Pneumococcal Conjugate-13 08/06/2015   Td 12/22/2007   Tdap 12/22/2007    Conditions to be addressed/monitored:  Hyperlipidemia,  Diabetes, Osteopenia, and Allergic Rhinitis  Care Plan : General Pharmacy (Adult)  Updates made by Germaine Pomfret, RPH since 07/07/2021 12:00 AM     Problem: Hyperlipidemia, Diabetes, Osteopenia, and Allergic Rhinitis   Priority: High     Long-Range Goal: Patient-Specific Goal   Start Date: 02/13/2021  Expected End Date: 07/07/2022  This Visit's Progress: On track  Recent Progress: On track  Priority: High  Note:   Current Barriers:  Unable to achieve control of cholesterol   Pharmacist Clinical Goal(s):  Patient will maintain control of diabetes as evidenced by A1c less than 7% Patient will achieve control of cholesterol as evidenced by LDL  less than 100 through collaboration with PharmD and provider.   Interventions: 1:1 collaboration with Lavon Paganini MD regarding development and update of comprehensive plan of care as evidenced by provider attestation and co-signature Inter-disciplinary care team collaboration (see longitudinal plan of care) Comprehensive medication review performed; medication list updated in electronic medical record  Diabetes (A1c goal <7%) -Controlled -Diagnosed 2018  -Current medications: Metformin XR 500 mg twice daily  -Vitamin B12 deficiency medications: Vitamin B12 SL 1000 mcg daily  -Medications previously tried: Metformin (diarrhea), Jardiance (ineffective), Farxiga (fatigue), Januvia (never filled)   -Energy significantly improved since starting B12 supplementation. Still interested in decreasing metformin if possible but willing to continue for now given improvement in her A1c.  -Current home glucose readings:  fasting glucose: 140s typically, highest in recall was 157 (patient was stressed with work)  -Reports hyperglycemic symptoms: Fatigue, lethargy  -Current meal patterns:  breakfast: Couple of eggs + [place] sausage OR bread + peanut butter. 1 cup of coffee + Creamer lunch: Turkey/Ham Sandwich (full or half) + Fruit OR Nuts OR  Applesauce  dinner: Salad + roasted vegetables + Protein ([place])  snacks: Trail Mix. Rarely may have popcorn  drinks: Primarily water.  -Current exercise: 4-5 hours weekly. Weights + Bicycle -Continue current medications  Hyperlipidemia: (LDL goal < 70) -Uncontrolled -Brother heart attack 47 -Mother heart attack 1  -Current treatment: Fish Oil 2000 mg daily  -  Medications previously tried: Fenofibrate, Vascepa, Statins (myalgia), Zetia, Niacin (hair loss)  -Educated on Cholesterol goals;  -Again discussed importance of reducing cardiovascular risk, but patient still very concerned about possible medication side effects and not ready at this time.  -Recommended to continue current medication  Osteopenia (Goal Prevent bone fractures) -Uncontrolled -Last DEXA Scan: 05/03/19   T-Score femoral neck: -1.8  T-Score total hip: -0.5  T-Score lumbar spine: NA  T-Score forearm radius: -2.1  10-year probability of major osteoporotic fracture: 17.8%  10-year probability of hip fracture: 3.5% -Patient is a candidate for pharmacologic treatment due to T-Score -1.0 to -2.5 and 10-year risk of hip fracture > 3% -Current treatment  Vitamin D 2000 units daily  -Medications previously tried: NA  -typically gets 1-1.5 of dairy servings daily. -Recommend 1200 mg of calcium daily from dietary and supplemental sources. -Patient is potential candidate for pharmacological treatment for osteopenia, will defer for now given hesitations with medication changes.  -Continue current medications   Patient Goals/Self-Care Activities Patient will:  - check glucose daily, document, and provide at future appointments  Follow Up Plan: Telephone follow up appointment with care management team member scheduled for:  10/05/2021 at 11:00 AM      Medication Assistance: None required.  Patient affirms current coverage meets needs.  Compliance/Adherence/Medication fill history: Care Gaps: Shingrix, PPSV23,  Tdap   Star-Rating Drugs: Farxiga 10 mg: LF 01/19/21 for 30-DS at [place]  Patient's preferred pharmacy is:  [facility] [id number] - [address], [place] [address] Phone: [phone] Fax: [phone]  [facility], [place] - [address] [place] [address] Phone: [phone] Fax: [place] [id number], [address] [place] [address] Phone: [phone] Fax: [phone]  Uses pill box? Yes Pt endorses 100% compliance  We discussed: Current pharmacy is preferred with insurance plan and patient is satisfied with pharmacy services Patient decided to: Continue current medication management strategy  Care Plan and Follow Up Patient Decision:  Patient agrees to Care Plan and Follow-up.  Plan: Telephone follow up appointment with care management team member scheduled for:  10/05/2021 at 11:00 AM  [name], PharmD, [place] [phone]

## 2021-07-21 ENCOUNTER — Encounter: Payer: Self-pay | Admitting: Family Medicine

## 2021-07-22 ENCOUNTER — Other Ambulatory Visit: Payer: Self-pay | Admitting: Family Medicine

## 2021-07-22 DIAGNOSIS — E119 Type 2 diabetes mellitus without complications: Secondary | ICD-10-CM

## 2021-07-29 DIAGNOSIS — E119 Type 2 diabetes mellitus without complications: Secondary | ICD-10-CM

## 2021-08-10 ENCOUNTER — Other Ambulatory Visit: Payer: Self-pay | Admitting: Family Medicine

## 2021-08-12 ENCOUNTER — Other Ambulatory Visit: Payer: Self-pay | Admitting: Family Medicine

## 2021-08-12 DIAGNOSIS — Z1231 Encounter for screening mammogram for malignant neoplasm of breast: Secondary | ICD-10-CM

## 2021-09-08 ENCOUNTER — Telehealth: Payer: Self-pay

## 2021-09-08 DIAGNOSIS — E119 Type 2 diabetes mellitus without complications: Secondary | ICD-10-CM

## 2021-09-08 NOTE — Progress Notes (Signed)
Per Clinical pharmacist, please reschedule patient telephone appointment on 10/05/2021.  Patient agreed and reschedule Telephone follow up appointment with Care management team member to : 10/19/2021 at 11:00 am.  Anderson Malta Clinical Pharmacist Assistant [phone]

## 2021-09-10 ENCOUNTER — Ambulatory Visit (INDEPENDENT_AMBULATORY_CARE_PROVIDER_SITE_OTHER): Payer: Medicare HMO

## 2021-09-10 ENCOUNTER — Other Ambulatory Visit: Payer: Self-pay

## 2021-09-10 VITALS — BP 122/60 | HR 57 | Temp 97.3°F | Ht 65.0 in | Wt 162.2 lb

## 2021-09-10 DIAGNOSIS — Z0000 Encounter for general adult medical examination without abnormal findings: Secondary | ICD-10-CM

## 2021-09-10 NOTE — Patient Instructions (Signed)
Christine Richards , Thank you for taking time to come for your Medicare Wellness Visit. I appreciate your ongoing commitment to your health goals. Please review the following plan we discussed and let me know if I can assist you in the future.   Screening recommendations/referrals: Colonoscopy: 12/15/11 Mammogram: 07/09/20, has appt scheduled Bone Density: 05/03/19, due in 5 years from that date Recommended yearly ophthalmology/optometry visit for glaucoma screening and checkup Recommended yearly dental visit for hygiene and checkup  Vaccinations: Influenza vaccine: n/d Pneumococcal vaccine: 08/06/15 Tdap vaccine: 12/22/07 Shingles vaccine: n/d   Covid-19:n/d  Advanced directives: n/d  Conditions/risks identified: none  Next appointment: Follow up in one year for your annual wellness visit 09/13/22 @ 3:40pm   Preventive Care 44 Years and Older, Female Preventive care refers to lifestyle choices and visits with your health care provider that can promote health and wellness. What does preventive care include? A yearly physical exam. This is also called an annual well check. Dental exams once or twice a year. Routine eye exams. Ask your health care provider how often you should have your eyes checked. Personal lifestyle choices, including: Daily care of your teeth and gums. Regular physical activity. Eating a healthy diet. Avoiding tobacco and drug use. Limiting alcohol use. Practicing safe sex. Taking low-dose aspirin every day. Taking vitamin and mineral supplements as recommended by your health care provider. What happens during an annual well check? The services and screenings done by your health care provider during your annual well check will depend on your age, overall health, lifestyle risk factors, and family history of disease. Counseling  Your health care provider may ask you questions about your: Alcohol use. Tobacco use. Drug use. Emotional well-being. Home and relationship  well-being. Sexual activity. Eating habits. History of falls. Memory and ability to understand (cognition). Work and work Statistician. Reproductive health. Screening  You may have the following tests or measurements: Height, weight, and BMI. Blood pressure. Lipid and cholesterol levels. These may be checked every 5 years, or more frequently if you are over 91 years old. Skin check. Lung cancer screening. You may have this screening every year starting at age 28 if you have a 30-pack-year history of smoking and currently smoke or have quit within the past 15 years. Fecal occult blood test (FOBT) of the stool. You may have this test every year starting at age 48. Flexible sigmoidoscopy or colonoscopy. You may have a sigmoidoscopy every 5 years or a colonoscopy every 10 years starting at age 70. Hepatitis C blood test. Hepatitis B blood test. Sexually transmitted disease (STD) testing. Diabetes screening. This is done by checking your blood sugar (glucose) after you have not eaten for a while (fasting). You may have this done every 1-3 years. Bone density scan. This is done to screen for osteoporosis. You may have this done starting at age 67. Mammogram. This may be done every 1-2 years. Talk to your health care provider about how often you should have regular mammograms. Talk with your health care provider about your test results, treatment options, and if necessary, the need for more tests. Vaccines  Your health care provider may recommend certain vaccines, such as: Influenza vaccine. This is recommended every year. Tetanus, diphtheria, and acellular pertussis (Tdap, Td) vaccine. You may need a Td booster every 10 years. Zoster vaccine. You may need this after age 38. Pneumococcal 13-valent conjugate (PCV13) vaccine. One dose is recommended after age 66. Pneumococcal polysaccharide (PPSV23) vaccine. One dose is recommended after age 79.  Talk to your health care provider about which  screenings and vaccines you need and how often you need them. This information is not intended to replace advice given to you by your health care provider. Make sure you discuss any questions you have with your health care provider. Document Released: 09/12/2015 Document Revised: 05/05/2016 Document Reviewed: 06/17/2015 Elsevier Interactive Patient Education  2017 [place] Prevention in the Home Falls can cause injuries. They can happen to people of all ages. There are many things you can do to make your home safe and to help prevent falls. What can I do on the outside of my home? Regularly fix the edges of walkways and driveways and fix any cracks. Remove anything that might make you trip as you walk through a door, such as a raised step or threshold. Trim any bushes or trees on the path to your home. Use bright outdoor lighting. Clear any walking paths of anything that might make someone trip, such as rocks or tools. Regularly check to see if handrails are loose or broken. Make sure that both sides of any steps have handrails. Any raised decks and porches should have guardrails on the edges. Have any leaves, snow, or ice cleared regularly. Use sand or salt on walking paths during winter. Clean up any spills in your garage right away. This includes oil or grease spills. What can I do in the bathroom? Use night lights. Install grab bars by the toilet and in the tub and shower. Do not use towel bars as grab bars. Use non-skid mats or decals in the tub or shower. If you need to sit down in the shower, use a plastic, non-slip stool. Keep the floor dry. Clean up any water that spills on the floor as soon as it happens. Remove soap buildup in the tub or shower regularly. Attach bath mats securely with double-sided non-slip rug tape. Do not have throw rugs and other things on the floor that can make you trip. What can I do in the bedroom? Use night lights. Make sure that you have a  light by your bed that is easy to reach. Do not use any sheets or blankets that are too big for your bed. They should not hang down onto the floor. Have a firm chair that has side arms. You can use this for support while you get dressed. Do not have throw rugs and other things on the floor that can make you trip. What can I do in the kitchen? Clean up any spills right away. Avoid walking on wet floors. Keep items that you use a lot in easy-to-reach places. If you need to reach something above you, use a strong step stool that has a grab bar. Keep electrical cords out of the way. Do not use floor polish or wax that makes floors slippery. If you must use wax, use non-skid floor wax. Do not have throw rugs and other things on the floor that can make you trip. What can I do with my stairs? Do not leave any items on the stairs. Make sure that there are handrails on both sides of the stairs and use them. Fix handrails that are broken or loose. Make sure that handrails are as long as the stairways. Check any carpeting to make sure that it is firmly attached to the stairs. Fix any carpet that is loose or worn. Avoid having throw rugs at the top or bottom of the stairs. If you do have throw  rugs, attach them to the floor with carpet tape. Make sure that you have a light switch at the top of the stairs and the bottom of the stairs. If you do not have them, ask someone to add them for you. What else can I do to help prevent falls? Wear shoes that: Do not have high heels. Have rubber bottoms. Are comfortable and fit you well. Are closed at the toe. Do not wear sandals. If you use a stepladder: Make sure that it is fully opened. Do not climb a closed stepladder. Make sure that both sides of the stepladder are locked into place. Ask someone to hold it for you, if possible. Clearly mark and make sure that you can see: Any grab bars or handrails. First and last steps. Where the edge of each step  is. Use tools that help you move around (mobility aids) if they are needed. These include: Canes. Walkers. Scooters. Crutches. Turn on the lights when you go into a dark area. Replace any light bulbs as soon as they burn out. Set up your furniture so you have a clear path. Avoid moving your furniture around. If any of your floors are uneven, fix them. If there are any pets around you, be aware of where they are. Review your medicines with your doctor. Some medicines can make you feel dizzy. This can increase your chance of falling. Ask your doctor what other things that you can do to help prevent falls. This information is not intended to replace advice given to you by your health care provider. Make sure you discuss any questions you have with your health care provider. Document Released: 06/12/2009 Document Revised: 01/22/2016 Document Reviewed: 09/20/2014 Elsevier Interactive Patient Education  2017 [employer].

## 2021-09-10 NOTE — Progress Notes (Signed)
Subjective:   Christine Richards is a 76 y.o. female who presents for Medicare Annual (Subsequent) preventive examination.  Review of Systems           Objective:    Today's Vitals   09/10/21 1341  BP: 122/60  Pulse: (!) 57  Temp: (!) 97.3 F (36.3 C)  TempSrc: Oral  SpO2: 97%  Weight: 162 lb 3.2 oz (73.6 kg)  Height: 5' 5"  (1.651 m)   Body mass index is 26.99 kg/m.  Advanced Directives 12/19/2019 09/26/2019 12/18/2018 11/04/2018 12/15/2017 03/31/2017 12/09/2016  Does Patient Have a Medical Advance Directive? No No Yes No No No No  Type of Advance Directive - - [place] in Chart? - - No - copy requested - - - -  Would patient like information on creating a medical advance directive? No - Patient declined No - Patient declined - No - Patient declined No - Patient declined No - Patient declined No - Patient declined    Current Medications (verified) Outpatient Encounter Medications as of 09/10/2021  Medication Sig   Blood Glucose Monitoring Suppl ([place] FLEX SYSTEM) w/Device KIT New kit with device to check blood sugar once daily   Cholecalciferol (VITAMIN D) 2000 UNITS CAPS Take 4,000 Units by mouth daily.   Cyanocobalamin (VITAMIN B-12) 1000 MCG SUBL Place 1 tablet (1,000 mcg total) under the tongue daily.   glucose blood (ONETOUCH VERIO) test strip CHECK BLOOD SUGAR TWICE DAILY   Lancets (ONETOUCH DELICA PLUS [id number]) MISC CHECK BLOOD SUGAR TWICE DAILY AS DIRECTED   metFORMIN (GLUCOPHAGE-XR) 500 MG 24 hr tablet TAKE ONE TABLET BY MOUTH TWICE A DAY WITH A MEAL *PLEASE SCHEDULE OFFICE VISIT BEFORE ANY FUTURE REFILLS   Omega-3 Fatty Acids (FISH OIL) 1000 MG CAPS Take 1,000-2,000 mg by mouth at bedtime.    No facility-administered encounter medications on file as of 09/10/2021.    Allergies (verified) Antihistamines, chlorpheniramine-type; Macrobid [nitrofurantoin macrocrystal]; and Statins   History: Past  Medical History:  Diagnosis Date   Arthritis    rheumatoid   Diabetes mellitus without complication ([place])    Diverticulosis    Environmental and seasonal allergies    GERD (gastroesophageal reflux disease)    Hyperlipidemia    Osteopenia    Sinus congestion    Sleep apnea    uses CPAP   Stroke ([place])    TIA 6 years ago no issues since   TIA (transient ischemic attack)    Past Surgical History:  Procedure Laterality Date   BREAST BIOPSY Right 2010   benign   BREAST CYST ASPIRATION Right    BREAST SURGERY Right 2010   biopsy   CATARACT EXTRACTION W/PHACO Right 11/11/2016   Procedure: CATARACT EXTRACTION PHACO AND INTRAOCULAR LENS PLACEMENT ([place]);  Surgeon: Eulogio Bear, MD;  Location: [facility];  Service: Ophthalmology;  Laterality: Right;  [place] 1:04.9 AP% 14.6 CDE 9.64 Fluid pack lot # [id number]   CATARACT EXTRACTION W/PHACO Left 09/26/2019   Procedure: CATARACT EXTRACTION PHACO AND INTRAOCULAR LENS PLACEMENT ([facility]) LEFT DIABETIC SYMFONY LENS 12.82 01:22.2 15.6%;  Surgeon: Leandrew Koyanagi, MD;  Location: [place];  Service: Ophthalmology;  Laterality: Left;   ESOPHAGOGASTRODUODENOSCOPY (EGD) WITH PROPOFOL N/A 12/29/2015   Procedure: ESOPHAGOGASTRODUODENOSCOPY (EGD) WITH PROPOFOL;  Surgeon: Lucilla Lame, MD;  Location: [place];  Service: Endoscopy;  Laterality: N/A;  GASTRI BIOPSY   TUBAL LIGATION     Family History  Problem  Relation Age of Onset   Congestive Heart Failure Mother    Heart attack Mother    Dementia Father    Heart attack Father    Prostate cancer Father    Atrial fibrillation Sister    Hyperlipidemia Sister    Heart disease Brother    Breast cancer Neg Hx    Social History   Socioeconomic History   Marital status: Widowed    Spouse name: Not on file   Number of children: 2   Years of education: College   Highest education level: Bachelor's degree (e.g., BA, AB, BS)  Occupational History   Occupation: [employer]    Comment: Full Time  Tobacco Use   Smoking status: Never   Smokeless tobacco: Never  Vaping Use   Vaping Use: Never used  Substance and Sexual Activity   Alcohol use: No   Drug use: No   Sexual activity: Not on file  Other Topics Concern   Not on file  Social History Narrative   2 biological children and 1 step child   Social Determinants of Health   Financial Resource Strain: Low Risk    Difficulty of Paying Living Expenses: Not hard at all  Food Insecurity: Not on file  Transportation Needs: Not on file  Physical Activity: Not on file  Stress: Not on file  Social Connections: Not on file    Tobacco Counseling Counseling given: Not Answered   Clinical Intake:  Pre-visit preparation completed: Yes  Pain : No/denies pain     Nutritional Risks: None Diabetes: Yes CBG done?: No Did pt. bring in CBG monitor from home?: No  How often do you need to have someone help you when you read instructions, pamphlets, or other written materials from your doctor or pharmacy?: 1 - Never  Diabetic?yes Nutrition Risk Assessment:  Has the patient had any N/V/D within the last 2 months?  No  Does the patient have any non-healing wounds?  No  Has the patient had any unintentional weight loss or weight gain?  No   Diabetes:  Is the patient diabetic?  Yes  If diabetic, was a CBG obtained today?  No  Did the patient bring in their glucometer from home?  No  How often do you monitor your CBG's? Once a day at least.   Financial Strains and Diabetes Management:  Are you having any financial strains with the device, your supplies or your medication? No .  Does the patient want to be seen by Chronic Care Management for management of their diabetes?  No  Would the patient like to be referred to a Nutritionist or for Diabetic Management?  No   Diabetic Exams:  Diabetic Eye Exam: Completed 05/07/21 (negative retinopathy).  Diabetic Foot Exam: Completed 10/04/19. Pt has  been advised about the importance in completing this exam.   Interpreter Needed?: No  Information entered by :: Kirke Shaggy, LPN   Activities of Daily Living In your present state of health, do you have any difficulty performing the following activities: 01/20/2021 10/22/2020  Hearing? N Y  Vision? N N  Difficulty concentrating or making decisions? N N  Walking or climbing stairs? N N  Dressing or bathing? N N  Doing errands, shopping? N N  Some recent data might be hidden    Patient Care Team: Gwyneth Sprout, FNP as PCP - General (Family Medicine) Leandrew Koyanagi, MD as Referring Physician (Ophthalmology) Lucky Cowboy Erskine Squibb, MD as Referring Physician (Vascular Surgery)  Germaine Pomfret, [facility] (Pharmacist)  Indicate any recent Medical Services you may have received from other than [facility] providers in the past year (date may be approximate).     Assessment:   This is a routine wellness examination for [facility].  Hearing/Vision screen No results found.  Dietary issues and exercise activities discussed:     Goals Addressed   None    Depression Screen PHQ 2/9 Scores 01/20/2021 10/22/2020 12/19/2019 12/18/2018 07/07/2018 12/15/2017 03/31/2017  PHQ - 2 Score 0 0 0 0 0 0 0  PHQ- 9 Score 0 0 - - 0 - -    Fall Risk Fall Risk  01/20/2021 10/22/2020 12/19/2019 12/18/2018 12/15/2017  Falls in the past year? 0 0 0 0 No  Number falls in past yr: 0 0 0 - -  Injury with Fall? 0 0 0 - -  Risk for fall due to : No Fall Risks No Fall Risks - - -  Follow up Falls evaluation completed Falls evaluation completed - - -    FALL RISK PREVENTION PERTAINING TO THE HOME:  Any stairs in or around the home? Yes  If so, are there any without handrails? No  Home free of loose throw rugs in walkways, pet beds, electrical cords, etc? Yes  Adequate lighting in your home to reduce risk of falls? Yes   ASSISTIVE DEVICES UTILIZED TO PREVENT FALLS:  Life alert? Yes  Use of a cane, walker or w/c? No  Grab  bars in the bathroom? No  Shower chair or bench in shower? No  Elevated toilet seat or a handicapped toilet? Yes   TIMED UP AND GO:  Was the test performed? Yes .  Length of time to ambulate 10 feet: 4 sec.   Gait steady and fast without use of assistive device  Cognitive Function:     6CIT Screen 12/19/2019 12/09/2016  What Year? 0 points 0 points  What month? 0 points 0 points  What time? 0 points 0 points  Count back from 20 0 points 0 points  Months in reverse 0 points 0 points  Repeat phrase 0 points 0 points  Total Score 0 0    Immunizations Immunization History  Administered Date(s) Administered   Pneumococcal Conjugate-13 08/06/2015   Td 12/22/2007   Tdap 12/22/2007    TDAP status: Due, Education has been provided regarding the importance of this vaccine. Advised may receive this vaccine at local pharmacy or Health Dept. Aware to provide a copy of the vaccination record if obtained from local pharmacy or Health Dept. Verbalized acceptance and understanding.  Flu Vaccine status: Declined, Education has been provided regarding the importance of this vaccine but patient still declined. Advised may receive this vaccine at local pharmacy or Health Dept. Aware to provide a copy of the vaccination record if obtained from local pharmacy or Health Dept. Verbalized acceptance and understanding.  Pneumococcal vaccine status: Up to date  Covid-19 vaccine status: Declined, Education has been provided regarding the importance of this vaccine but patient still declined. Advised may receive this vaccine at local pharmacy or Health Dept.or vaccine clinic. Aware to provide a copy of the vaccination record if obtained from local pharmacy or Health Dept. Verbalized acceptance and understanding.  Qualifies for Shingles Vaccine? Yes   Zostavax completed No   Shingrix Completed?: No.    Education has been provided regarding the importance of this vaccine. Patient has been advised to call  insurance company to determine out of pocket expense if they have  not yet received this vaccine. Advised may also receive vaccine at local pharmacy or Health Dept. Verbalized acceptance and understanding.  Screening Tests Health Maintenance  Topic Date Due   COVID-19 Vaccine (1) Never done   Zoster Vaccines- Shingrix (1 of 2) Never done   Pneumonia Vaccine 15+ Years old (2 - PPSV23 if available, else PCV20) 08/05/2016   TETANUS/TDAP  12/21/2017   FOOT EXAM  10/03/2020   INFLUENZA VACCINE  Never done   MAMMOGRAM  07/09/2021   HEMOGLOBIN A1C  12/17/2021   URINE MICROALBUMIN  03/21/2022   OPHTHALMOLOGY EXAM  05/07/2022   DEXA SCAN  05/02/2024   Hepatitis C Screening  Completed   HPV VACCINES  Aged Out   COLONOSCOPY (Pts 45-27yr Insurance coverage will need to be confirmed)  Discontinued    Health Maintenance  Health Maintenance Due  Topic Date Due   COVID-19 Vaccine (1) Never done   Zoster Vaccines- Shingrix (1 of 2) Never done   Pneumonia Vaccine 76 Years old (2 - PPSV23 if available, else PCV20) 08/05/2016   TETANUS/TDAP  12/21/2017   FOOT EXAM  10/03/2020   INFLUENZA VACCINE  Never done   MAMMOGRAM  07/09/2021    Colorectal cancer screening: Type of screening: Colonoscopy. Completed 12/15/11. Repeat every aged out years  Mammogram status: Completed 07/09/20, has one scheduled for next month, doesn't require referral. Repeat every year  Bone Density status: Completed 05/03/19. Results reflect: Bone density results: NORMAL. Repeat every 5 years.  Lung Cancer Screening: (Low Dose CT Chest recommended if Age 511-80years, 30 pack-year currently smoking OR have quit w/in 15years.) does not qualify.    Additional Screening:  Hepatitis C Screening: does not qualify; Completed 12/20/17  Vision Screening: Recommended annual ophthalmology exams for early detection of glaucoma and other disorders of the eye. Is the patient up to date with their annual eye exam?  Yes  Who is the  provider or what is the name of the office in which the patient attends annual eye exams? [facility] pt is not established with a provider, would they like to be referred to a provider to establish care? No .   Dental Screening: Recommended annual dental exams for proper oral hygiene  Community Resource Referral / Chronic Care Management: CRR required this visit?  No   CCM required this visit?  No      Plan:     I have personally reviewed and noted the following in the patients chart:   Medical and social history Use of alcohol, tobacco or illicit drugs  Current medications and supplements including opioid prescriptions.  Functional ability and status Nutritional status Physical activity Advanced directives List of other physicians Hospitalizations, surgeries, and ER visits in previous 12 months Vitals Screenings to include cognitive, depression, and falls Referrals and appointments  In addition, I have reviewed and discussed with patient certain preventive protocols, quality metrics, and best practice recommendations. A written personalized care plan for preventive services as well as general preventive health recommendations were provided to patient.     LDionisio David LPN   [id number]  Nurse Notes: none

## 2021-09-16 ENCOUNTER — Telehealth: Payer: Self-pay

## 2021-09-16 MED ORDER — METFORMIN HCL ER 500 MG PO TB24: 500.0000 mg | ORAL_TABLET | Freq: Two times a day (BID) | ORAL | 1 refills | Status: DC

## 2021-09-16 NOTE — Progress Notes (Signed)
Chronic Care Management Pharmacy Assistant   Name: Latayna Ritchie  MRN: [id number] DOB: Dec 17, 1945  Reason for Encounter:Diabetes Disease State Call.  Recent office visits:  09/10/2021 Kirke Shaggy LPN (PCP Office) Medicare wellness completed, No medication Changes noted  Recent consult visits:  NO recent consult visit  Hospital visits:  None in previous 6 months  Medications: Outpatient Encounter Medications as of 09/16/2021  Medication Sig   Blood Glucose Monitoring Suppl ([place]) w/Device KIT New kit with device to check blood sugar once daily   Cholecalciferol (VITAMIN D) 2000 UNITS CAPS Take 4,000 Units by mouth daily.   Cyanocobalamin (VITAMIN B-12) 1000 MCG SUBL Place 1 tablet (1,000 mcg total) under the tongue daily.   glucose blood (ONETOUCH VERIO) test strip CHECK BLOOD SUGAR TWICE DAILY   Lancets (ONETOUCH DELICA PLUS [id number]) MISC CHECK BLOOD SUGAR TWICE DAILY AS DIRECTED   metFORMIN (GLUCOPHAGE-XR) 500 MG 24 hr tablet TAKE ONE TABLET BY MOUTH TWICE A DAY WITH A MEAL *PLEASE SCHEDULE OFFICE VISIT BEFORE ANY FUTURE REFILLS   Omega-3 Fatty Acids (FISH OIL) 1000 MG CAPS Take 1,000-2,000 mg by mouth at bedtime.    No facility-administered encounter medications on file as of 09/16/2021.    Care Gaps: COVID-19 Vaccine Shingrix Vaccine Pneumonia Vaccine Tetanus Vaccine Foot Exam Influenza Vaccine  Star Rating Drugs: Metformin 500 mg last filled 07/21/2021 30 day supply no pharmacy noted  Medication Fill Gaps: None ID  Recent Relevant Labs: Lab Results  Component Value Date/Time   HGBA1C 6.7 (A) 06/18/2021 11:09 AM   HGBA1C 7.3 (H) 05/22/2021 01:15 PM   HGBA1C 7.6 (A) 01/20/2021 01:30 PM   HGBA1C 7.2 (H) 04/18/2020 03:05 PM   MICROALBUR 10 03/21/2021 12:00 AM   MICROALBUR 50 04/18/2020 02:40 PM   MICROALBUR 50 12/09/2016 10:19 AM    Kidney Function Lab Results  Component Value Date/Time   CREATININE 0.76 01/20/2021 02:08 PM    CREATININE 0.65 04/18/2020 03:05 PM   CREATININE 0.72 03/17/2013 02:43 PM   GFRNONAA 88 04/18/2020 03:05 PM   GFRNONAA >60 03/17/2013 02:43 PM   GFRAA 101 04/18/2020 03:05 PM   GFRAA >60 03/17/2013 02:43 PM    Current antihyperglycemic regimen:  Metformin XR 500 mg twice daily  What recent interventions/DTPs have been made to improve glycemic control:  None ID Have there been any recent hospitalizations or ED visits since last visit with CPP? No Patient denies hypoglycemic symptoms, including Pale, Sweaty, Shaky, Hungry, Nervous/irritable, and Vision changes Patient denies hyperglycemic symptoms, including blurry vision, excessive thirst, fatigue, polyuria, and weakness How often are you checking your blood sugar? once daily What are your blood sugars ranging?  Fasting:  Patient states her blood sugar ranges around 140 first thing in the morning.  During the week, how often does your blood glucose drop below 70? Never Are you checking your feet daily/regularly?   Patient denies any pain, or tingling  sensation in her feet, but reports some numbness in her big toe.Patient states her podiatrist inform her the numbness in her toe is not related to diabetes.   Patient states she has to call every month to request a refill for metformin.Patient is asking if there is a way she does not have to call monthly to get a refill.Patient states she will need a refill next week for metformin.Informed patient I will ask the clinical pharmacist if he is able to send over a 90 day supply.Notified Clinical pharmacist.   Patient verbalized understanding and  appreciation.  Adherence Review: Is the patient currently on a STATIN medication? No Is the patient currently on ACE/ARB medication? No Does the patient have >5 day gap between last estimated fill dates? No  Telephone follow up appointment with Care management team member to : 10/19/2021 at 11:00 am.  [place] Pharmacist  Assistant [phone]

## 2021-09-16 NOTE — Addendum Note (Signed)
Addended by: Daron Offer A on: 09/16/2021 10:22 AM   Modules accepted: Orders

## 2021-09-21 ENCOUNTER — Ambulatory Visit
Admission: RE | Admit: 2021-09-21 | Discharge: 2021-09-21 | Disposition: A | Discharge status: Home / Self Care | Payer: Medicare HMO | Source: Ambulatory Visit | Attending: Family Medicine | Admitting: Family Medicine

## 2021-09-21 ENCOUNTER — Other Ambulatory Visit: Payer: Self-pay

## 2021-09-21 DIAGNOSIS — Z1231 Encounter for screening mammogram for malignant neoplasm of breast: Secondary | ICD-10-CM | POA: Diagnosis not present

## 2021-09-22 ENCOUNTER — Other Ambulatory Visit: Payer: Self-pay | Admitting: Family Medicine

## 2021-09-22 DIAGNOSIS — E119 Type 2 diabetes mellitus without complications: Secondary | ICD-10-CM

## 2021-10-05 ENCOUNTER — Telehealth: Payer: Medicare HMO

## 2021-10-08 ENCOUNTER — Other Ambulatory Visit: Payer: Self-pay

## 2021-10-08 ENCOUNTER — Telehealth: Payer: Self-pay | Admitting: Family Medicine

## 2021-10-08 DIAGNOSIS — E119 Type 2 diabetes mellitus without complications: Secondary | ICD-10-CM

## 2021-10-08 MED ORDER — METFORMIN HCL ER 500 MG PO TB24: 500.0000 mg | ORAL_TABLET | Freq: Two times a day (BID) | ORAL | 0 refills | Status: DC

## 2021-10-08 NOTE — Telephone Encounter (Signed)
done

## 2021-10-08 NOTE — Telephone Encounter (Signed)
[facility] pharmacy faxed refill request for the following medications:  metFORMIN (GLUCOPHAGE-XR) 500 MG 24 hr tablet   Please advise

## 2021-10-12 ENCOUNTER — Telehealth: Payer: Self-pay | Admitting: Family Medicine

## 2021-10-12 NOTE — Telephone Encounter (Signed)
Opened in error

## 2021-10-14 ENCOUNTER — Other Ambulatory Visit: Payer: Self-pay

## 2021-10-14 ENCOUNTER — Telehealth: Payer: Self-pay

## 2021-10-14 ENCOUNTER — Telehealth: Payer: Self-pay | Admitting: Family Medicine

## 2021-10-14 DIAGNOSIS — E119 Type 2 diabetes mellitus without complications: Secondary | ICD-10-CM

## 2021-10-14 MED ORDER — ONETOUCH DELICA PLUS LANCET33G MISC: 1 refills | Status: DC

## 2021-10-14 NOTE — Telephone Encounter (Signed)
[facility] pharmacy faxed refill request for the following medications:  Lancets (ONETOUCH DELICA PLUS [id number]   Please advise

## 2021-10-14 NOTE — Progress Notes (Signed)
Per Clinical pharmacist. Please reschedule patient telephone appointment on 10/19/2021.  Patient agreed and reschedule her telephone appointment to 10/26/2021 at 11:00 am.  [place] Pharmacist Assistant [phone]

## 2021-10-15 ENCOUNTER — Encounter: Payer: Self-pay | Admitting: Family Medicine

## 2021-10-15 DIAGNOSIS — E119 Type 2 diabetes mellitus without complications: Secondary | ICD-10-CM

## 2021-10-16 MED ORDER — ONETOUCH DELICA PLUS LANCET33G MISC: 2 refills | Status: DC

## 2021-10-16 MED ORDER — ONETOUCH VERIO VI STRP: ORAL_STRIP | 2 refills | Status: DC

## 2021-10-16 MED ORDER — ONETOUCH VERIO FLEX SYSTEM W/DEVICE KIT: PACK | 0 refills | Status: DC

## 2021-10-19 ENCOUNTER — Telehealth: Payer: Medicare HMO

## 2021-10-20 ENCOUNTER — Encounter: Payer: Self-pay | Admitting: Family Medicine

## 2021-10-21 MED ORDER — LANCETS 33G MISC: 3 refills | Status: DC

## 2021-10-26 ENCOUNTER — Ambulatory Visit (INDEPENDENT_AMBULATORY_CARE_PROVIDER_SITE_OTHER): Payer: Medicare HMO

## 2021-10-26 DIAGNOSIS — E119 Type 2 diabetes mellitus without complications: Secondary | ICD-10-CM

## 2021-10-26 DIAGNOSIS — E1169 Type 2 diabetes mellitus with other specified complication: Secondary | ICD-10-CM

## 2021-10-26 DIAGNOSIS — E538 Deficiency of other specified B group vitamins: Secondary | ICD-10-CM

## 2021-10-26 NOTE — Progress Notes (Signed)
Chronic Care Management Pharmacy Note  11/02/2021 Name:  Christine Richards MRN:  [id number] DOB:  08-May-1946  Summary: Patient presents for CCM follow-up.  -Still not willing to try PCSK9 Inhibitor  Recommendations/Changes made from today's visit: -Continue current medications  -Reschedule DEXA/ mammogram   Plan: No further follow up required: Patient will reach out to clinical team as needed.  Recommended Problem List Changes:  Add: Familial hypercholesterolemia  Subjective: Christine Richards is an 76 y.o. year old female who is a primary patient of Gwyneth Sprout, FNP.  The CCM team was consulted for assistance with disease management and care coordination needs.    Engaged with patient by telephone for follow up visit in response to provider referral for pharmacy case management and/or care coordination services.   Consent to Services:  The patient was given information about Chronic Care Management services, agreed to services, and gave verbal consent prior to initiation of services.  Please see initial visit note for detailed documentation.   Patient Care Team: Gwyneth Sprout, FNP as PCP - General (Family Medicine) Leandrew Koyanagi, MD as Referring Physician (Ophthalmology) Lucky Cowboy Erskine Squibb, MD as Referring Physician (Vascular Surgery) Germaine Pomfret, [facility] (Pharmacist)  Recent office visits: 01/20/21: Patient presented to Dr. Brita Romp for follow-up. A1c  stable at 7.6%. LDL 279. Patient refused PCSK9 therapy.  10/22/20: Patient presented to Fenton Malling, PA-C for follow-up. Vascepa stopped (Never filled). A1c worsened to 7.6%.   Recent consult visits: 08/19/20: Patient presented to [facility] visits: None in previous 6 months   Objective:  Lab Results  Component Value Date   CREATININE 0.76 01/20/2021   BUN 21 01/20/2021   GFRNONAA 88 04/18/2020   GFRAA 101 04/18/2020   NA 144 01/20/2021   K 4.1 01/20/2021   CALCIUM 9.5 01/20/2021    CO2 22 01/20/2021   GLUCOSE 146 (H) 01/20/2021    Lab Results  Component Value Date/Time   HGBA1C 6.7 (A) 06/18/2021 11:09 AM   HGBA1C 7.3 (H) 05/22/2021 01:15 PM   HGBA1C 7.6 (A) 01/20/2021 01:30 PM   HGBA1C 7.2 (H) 04/18/2020 03:05 PM   MICROALBUR 10 03/21/2021 12:00 AM   MICROALBUR 50 04/18/2020 02:40 PM   MICROALBUR 50 12/09/2016 10:19 AM    Last diabetic Eye exam:  Lab Results  Component Value Date/Time   HMDIABEYEEXA No Retinopathy 05/07/2021 12:00 AM    Last diabetic Foot exam: No results found for: HMDIABFOOTEX   Lab Results  Component Value Date   CHOL 396 (H) 01/20/2021   HDL 60 01/20/2021   LDLCALC 279 (H) 01/20/2021   TRIG 264 (H) 01/20/2021   CHOLHDL 6.6 (H) 01/20/2021    Hepatic Function Latest Ref Rng & Units 01/20/2021 04/18/2020 03/08/2019  Total Protein 6.0 - 8.5 g/dL 6.8 6.9 6.7  Albumin 3.7 - 4.7 g/dL 4.6 4.5 4.7  AST 0 - 40 IU/L _0 ALT 0 - 32 IU/L _1 Alk Phosphatase 44 - 121 IU/L 95 89 84  Total Bilirubin 0.0 - 1.2 mg/dL 0.4 <0.2 0.6    Lab Results  Component Value Date/Time   TSH 1.740 04/18/2020 03:05 PM   TSH 2.520 03/08/2019 08:47 AM    CBC Latest Ref Rng & Units 04/18/2020 03/08/2019 11/04/2018  WBC 3.4 - 10.8 x10E3/uL 8.2 5.2 4.9  Hemoglobin 11.1 - 15.9 g/dL 13.8 14.3 14.3  Hematocrit 34.0 - 46.6 % 41.7 42.6 44.7  Platelets 150 - 450 x10E3/uL 242 234  219    Lab Results  Component Value Date/Time   VD25OH 47.4 01/20/2021 02:08 PM   VD25OH 29.4 (L) 04/18/2020 03:05 PM    Clinical ASCVD: No  The ASCVD Risk score (Arnett DK, et al., 2019) failed to calculate for the following reasons:   The valid total cholesterol range is 130 to 320 mg/dL    Depression screen [facility] 2/9 09/10/2021 01/20/2021 10/22/2020  Decreased Interest 0 0 0  Down, Depressed, Hopeless 0 0 0  PHQ - 2 Score 0 0 0  Altered sleeping - 0 0  Tired, decreased energy - 0 0  Change in appetite - 0 0  Feeling bad or failure about yourself  - 0 0  Trouble  concentrating - 0 0  Moving slowly or fidgety/restless - 0 0  Suicidal thoughts - 0 0  PHQ-9 Score - 0 0  Difficult doing work/chores - Not difficult at all Not difficult at all    Social History   Tobacco Use  Smoking Status Never  Smokeless Tobacco Never   BP Readings from Last 3 Encounters:  09/10/21 122/60  06/18/21 137/63  01/20/21 120/63   Pulse Readings from Last 3 Encounters:  09/10/21 (!) 57  06/18/21 68  01/20/21 74   Wt Readings from Last 3 Encounters:  09/10/21 162 lb 3.2 oz (73.6 kg)  06/18/21 162 lb 9.6 oz (73.8 kg)  01/20/21 160 lb (72.6 kg)   BMI Readings from Last 3 Encounters:  09/10/21 26.99 kg/m  06/18/21 27.06 kg/m  01/20/21 26.63 kg/m    Assessment/Interventions: Review of patient past medical history, allergies, medications, health status, including review of consultants reports, laboratory and other test data, was performed as part of comprehensive evaluation and provision of chronic care management services.   SDOH:  (Social Determinants of Health) assessments and interventions performed: Yes SDOH Interventions    Flowsheet Row Most Recent Value  SDOH Interventions   Financial Strain Interventions Intervention Not Indicated         SDOH Screenings   Alcohol Screen: Low Risk    Last Alcohol Screening Score (AUDIT): 0  Depression (PHQ2-9): Low Risk    PHQ-2 Score: 0  Financial Resource Strain: Low Risk    Difficulty of Paying Living Expenses: Not hard at all  Food Insecurity: No Food Insecurity   Worried About Charity fundraiser in the Last Year: Never true   Ran Out of Food in the Last Year: Never true  Housing: Low Risk    Last Housing Risk Score: 0  Physical Activity: Sufficiently Active   Days of Exercise per Week: 4 days   Minutes of Exercise per Session: 60 min  Social Connections: Moderately Isolated   Frequency of Communication with Friends and Family: More than three times a week   Frequency of Social Gatherings with  Friends and Family: More than three times a week   Attends Religious Services: More than 4 times per year   Active Member of [employer] or Organizations: No   Attends Archivist Meetings: Never   Marital Status: Widowed  Stress: Not on file  Tobacco Use: Low Risk    Smoking Tobacco Use: Never   Smokeless Tobacco Use: Never   Passive Exposure: Not on file  Transportation Needs: No Transportation Needs   Lack of Transportation (Medical): No   Lack of Transportation (Non-Medical): No    CCM Care Plan  Allergies  Allergen Reactions   Antihistamines, Chlorpheniramine-Type     Facial swelling  Macrobid [Nitrofurantoin Macrocrystal]     unknown   Statins     Muscle Pain, Weakness     Medications Reviewed Today     Reviewed by Dionisio David, LPN (Licensed Practical Nurse) on 09/10/21 at 1413  Med List Status: <None>   Medication Order Taking? Sig Documenting Provider Last Dose Status Informant  Blood Glucose Monitoring Suppl (ONETOUCH VERIO FLEX SYSTEM) w/Device KIT [id number] Yes New kit with device to check blood sugar once daily Mar Daring, PA-C Taking Active   Cholecalciferol (VITAMIN D) 2000 UNITS CAPS [id number] Yes Take 4,000 Units by mouth daily. [provider] Taking Active Self           Med Note Jilda Roche A   Mon Nov 08, 2016  1:00 PM)    Cyanocobalamin (VITAMIN B-12) 1000 MCG SUBL [id number] Yes Place 1 tablet (1,000 mcg total) under the tongue daily. Virginia Crews, MD Taking Active   glucose blood [facility] VERIO) test strip [id number] Yes CHECK BLOOD SUGAR TWICE DAILY Gwyneth Sprout, FNP Taking Active   Lancets [facility] Donaciano Eva PLUS [id number]) MISC [id number] Yes CHECK BLOOD SUGAR TWICE DAILY AS DIRECTED Gwyneth Sprout, FNP Taking Active   metFORMIN (GLUCOPHAGE-XR) 500 MG 24 hr tablet [id number] Yes TAKE ONE TABLET BY MOUTH TWICE A DAY WITH A MEAL *PLEASE SCHEDULE OFFICE VISIT BEFORE ANY FUTURE REFILLS Gwyneth Sprout, FNP Taking Active    Omega-3 Fatty Acids (FISH OIL) 1000 MG CAPS [id number] Yes Take 1,000-2,000 mg by mouth at bedtime.  [provider] Taking Active Self           Med Note Lesly Dukes Nov 08, 2016  1:00 PM)              Patient Active Problem List   Diagnosis Date Noted   Vitamin B12 deficiency 06/18/2021   Need for shingles vaccine 06/18/2021   Dyspepsia 06/18/2021   T2DM (type 2 diabetes mellitus) ([place]) 12/25/2014   Aerophobia 12/25/2014   Avitaminosis D 12/25/2014    Immunization History  Administered Date(s) Administered   Pneumococcal Conjugate-13 08/06/2015   Td 12/22/2007   Tdap 12/22/2007    Conditions to be addressed/monitored:  Hyperlipidemia, Diabetes, Osteopenia, and Allergic Rhinitis  Care Plan : General Pharmacy (Adult)  Updates made by Germaine Pomfret, RPH since 11/02/2021 12:00 AM     Problem: Hyperlipidemia, Diabetes, Osteopenia, and Allergic Rhinitis   Priority: High     Long-Range Goal: Patient-Specific Goal   Start Date: 02/13/2021  Expected End Date: 07/07/2022  This Visit's Progress: On track  Recent Progress: On track  Priority: High  Note:   Current Barriers:  Unable to achieve control of cholesterol   Pharmacist Clinical Goal(s):  Patient will maintain control of diabetes as evidenced by A1c less than 7% Patient will achieve control of cholesterol as evidenced by LDL  less than 100 through collaboration with PharmD and provider.   Interventions: 1:1 collaboration with Tally Joe T, FNP regarding development and update of comprehensive plan of care as evidenced by provider attestation and co-signature Inter-disciplinary care team collaboration (see longitudinal plan of care) Comprehensive medication review performed; medication list updated in electronic medical record  Diabetes (A1c goal <7%) -Controlled -Diagnosed 2018  -Current medications: Metformin XR 500 mg twice daily  -Vitamin B12 deficiency medications: Vitamin B12 SL  1000 mcg daily  -Medications previously tried: Metformin (diarrhea), Jardiance (ineffective), Farxiga (fatigue), Januvia (never filled)   -Energy significantly improved since starting B12 supplementation.  Still interested in decreasing metformin if possible but willing to continue for now given improvement in her A1c.  -Current home glucose readings:  fasting glucose: 140s typically, highest in recall was 157 (patient was stressed with work)  -Reports hyperglycemic symptoms: Fatigue, lethargy  -Current meal patterns:  breakfast: Couple of eggs + [place] sausage OR bread + peanut butter. 1 cup of coffee + Creamer lunch: Turkey/Ham Sandwich (full or half) + Fruit OR Nuts OR Applesauce  dinner: Salad + roasted vegetables + Protein ([place]mon)  snacks: Trail Mix. Rarely may have popcorn  drinks: Primarily water.  -Current exercise: 4-5 hours weekly. Weights + Bicycle -Continue current medications  Hyperlipidemia: (LDL goal < 70) -Uncontrolled -Brother heart attack 7 -Mother heart attack 84  -Current treatment: Fish Oil 2000 mg daily  -Medications previously tried: Fenofibrate, Vascepa, Statins (myalgia), Zetia, Niacin (hair loss)  -Educated on Cholesterol goals;  -Again discussed importance of reducing cardiovascular risk, but patient still very concerned about possible medication side effects and not ready at this time.  -Recommended to continue current medication  Osteopenia (Goal Prevent bone fractures) -Uncontrolled -Last DEXA Scan: 05/03/19   T-Score femoral neck: -1.8  T-Score total hip: -0.5  T-Score lumbar spine: NA  T-Score forearm radius: -2.1  10-year probability of major osteoporotic fracture: 17.8%  10-year probability of hip fracture: 3.5% -Patient is a candidate for pharmacologic treatment due to T-Score -1.0 to -2.5 and 10-year risk of hip fracture > 3% -Current treatment  Vitamin D 2000 units daily  -Medications previously tried: NA  -typically gets 1-1.5 of dairy  servings daily. -Recommend 1200 mg of calcium daily from dietary and supplemental sources. -Patient is potential candidate for pharmacological treatment for osteopenia, will defer for now given hesitations with medication changes.  -Continue current medications   Patient Goals/Self-Care Activities Patient will:  - check glucose daily, document, and provide at future appointments  Follow Up Plan: No further follow up required: Patient will reach out to clinical team as needed.    Medication Assistance: None required.  Patient affirms current coverage meets needs.  Compliance/Adherence/Medication fill history: Care Gaps: Shingrix, PPSV23, Tdap   Star-Rating Drugs: Farxiga 10 mg: LF 01/19/21 for 30-DS at [facility]  Patient's preferred pharmacy is:  [facility] [id number] - [address], [place] [address] Phone: [phone] Fax: [phone]  [place], [place] - [address] [place] [address] Phone: [phone] Fax: [facility] [id number], [place] - [address] [place] [address] Phone: [phone] Fax: [phone]  [facility] - [place], [place] - [address] [place] [address] Phone: [phone] Fax: [phone]   Uses pill box? Yes Pt endorses 100% compliance  We discussed: Current pharmacy is preferred with insurance plan and patient is satisfied with pharmacy services Patient decided to: Continue current medication management strategy  Care Plan and Follow Up Patient Decision:  Patient agrees to Care Plan and Follow-up.  Plan: No further follow up required: Patient will reach out to clinical team as needed.  [name], PharmD, [place] [phone]

## 2021-10-27 DIAGNOSIS — E1169 Type 2 diabetes mellitus with other specified complication: Secondary | ICD-10-CM

## 2021-10-27 DIAGNOSIS — E119 Type 2 diabetes mellitus without complications: Secondary | ICD-10-CM

## 2021-10-27 DIAGNOSIS — E785 Hyperlipidemia, unspecified: Secondary | ICD-10-CM

## 2021-11-02 NOTE — Patient Instructions (Signed)
Visit Information It was great speaking with you today!  Please let me know if you have any questions about our visit.   Goals Addressed             This Visit's Progress    Monitor and Manage My Blood Sugar-Diabetes Type 2   On track    Timeframe:  Long-Range Goal Priority:  High Start Date: 02/13/2021                             Expected End Date: 08/15/2022                      Follow Up within 90 days   - check blood sugar at prescribed times - check blood sugar if I feel it is too high or too low - enter blood sugar readings and medication or insulin into daily log    Why is this important?   Checking your blood sugar at home helps to keep it from getting very high or very low.  Writing the results in a diary or log helps the doctor know how to care for you.  Your blood sugar log should have the time, date and the results.  Also, write down the amount of insulin or other medicine that you take.  Other information, like what you ate, exercise done and how you were feeling, will also be helpful.     Notes:         Patient Care Plan: General Pharmacy (Adult)     Problem Identified: Hyperlipidemia, Diabetes, Osteopenia, and Allergic Rhinitis   Priority: High     Long-Range Goal: Patient-Specific Goal   Start Date: 02/13/2021  Expected End Date: 07/07/2022  This Visit's Progress: On track  Recent Progress: On track  Priority: High  Note:   Current Barriers:  Unable to achieve control of cholesterol   Pharmacist Clinical Goal(s):  Patient will maintain control of diabetes as evidenced by A1c less than 7% Patient will achieve control of cholesterol as evidenced by LDL  less than 100 through collaboration with PharmD and provider.   Interventions: 1:1 collaboration with Tally Joe T, FNP regarding development and update of comprehensive plan of care as evidenced by provider attestation and co-signature Inter-disciplinary care team collaboration (see longitudinal  plan of care) Comprehensive medication review performed; medication list updated in electronic medical record  Diabetes (A1c goal <7%) -Controlled -Diagnosed 2018  -Current medications: Metformin XR 500 mg twice daily  -Vitamin B12 deficiency medications: Vitamin B12 SL 1000 mcg daily  -Medications previously tried: Metformin (diarrhea), Jardiance (ineffective), Farxiga (fatigue), Januvia (never filled)   -Energy significantly improved since starting B12 supplementation. Still interested in decreasing metformin if possible but willing to continue for now given improvement in her A1c.  -Current home glucose readings:  fasting glucose: 140s typically, highest in recall was 157 (patient was stressed with work)  -Reports hyperglycemic symptoms: Fatigue, lethargy  -Current meal patterns:  breakfast: Couple of eggs + [place] sausage OR bread + peanut butter. 1 cup of coffee + Creamer lunch: Turkey/Ham Sandwich (full or half) + Fruit OR Nuts OR Applesauce  dinner: Salad + roasted vegetables + Protein ([place])  snacks: Trail Mix. Rarely may have popcorn  drinks: Primarily water.  -Current exercise: 4-5 hours weekly. Weights + Bicycle -Continue current medications  Hyperlipidemia: (LDL goal < 70) -Uncontrolled -Brother heart attack 63 -Mother heart attack 51  -Current treatment: Fish Oil 2000  mg daily  -Medications previously tried: Fenofibrate, Vascepa, Statins (myalgia), Zetia, Niacin (hair loss)  -Educated on Cholesterol goals;  -Again discussed importance of reducing cardiovascular risk, but patient still very concerned about possible medication side effects and not ready at this time.  -Recommended to continue current medication  Osteopenia (Goal Prevent bone fractures) -Uncontrolled -Last DEXA Scan: 05/03/19   T-Score femoral neck: -1.8  T-Score total hip: -0.5  T-Score lumbar spine: NA  T-Score forearm radius: -2.1  10-year probability of major osteoporotic fracture:  17.8%  10-year probability of hip fracture: 3.5% -Patient is a candidate for pharmacologic treatment due to T-Score -1.0 to -2.5 and 10-year risk of hip fracture > 3% -Current treatment  Vitamin D 2000 units daily  -Medications previously tried: NA  -typically gets 1-1.5 of dairy servings daily. -Recommend 1200 mg of calcium daily from dietary and supplemental sources. -Patient is potential candidate for pharmacological treatment for osteopenia, will defer for now given hesitations with medication changes.  -Continue current medications   Patient Goals/Self-Care Activities Patient will:  - check glucose daily, document, and provide at future appointments  Follow Up Plan: No further follow up required: Patient will reach out to clinical team as needed.    Patient agreed to services and verbal consent obtained.   Patient verbalizes understanding of instructions and care plan provided today and agrees to view in [place]. Active MyChart status confirmed with patient.    [name], PharmD, [place], CPP  Clinical Pharmacist Practitioner  [facility] [phone]

## 2021-11-03 DIAGNOSIS — H6123 Impacted cerumen, bilateral: Secondary | ICD-10-CM | POA: Insufficient documentation

## 2021-11-03 DIAGNOSIS — H93293 Other abnormal auditory perceptions, bilateral: Secondary | ICD-10-CM | POA: Diagnosis not present

## 2021-11-03 DIAGNOSIS — H9193 Unspecified hearing loss, bilateral: Secondary | ICD-10-CM | POA: Insufficient documentation

## 2021-11-04 DIAGNOSIS — H9313 Tinnitus, bilateral: Secondary | ICD-10-CM | POA: Insufficient documentation

## 2021-11-04 DIAGNOSIS — H903 Sensorineural hearing loss, bilateral: Secondary | ICD-10-CM | POA: Insufficient documentation

## 2021-12-16 ENCOUNTER — Ambulatory Visit (INDEPENDENT_AMBULATORY_CARE_PROVIDER_SITE_OTHER): Payer: Medicare HMO | Admitting: Family Medicine

## 2021-12-16 ENCOUNTER — Encounter: Payer: Self-pay | Admitting: Family Medicine

## 2021-12-16 VITALS — BP 108/59 | HR 78 | Temp 98.5°F | Resp 16 | Wt 160.0 lb

## 2021-12-16 DIAGNOSIS — E1165 Type 2 diabetes mellitus with hyperglycemia: Secondary | ICD-10-CM

## 2021-12-16 DIAGNOSIS — N819 Female genital prolapse, unspecified: Secondary | ICD-10-CM

## 2021-12-16 DIAGNOSIS — E119 Type 2 diabetes mellitus without complications: Secondary | ICD-10-CM | POA: Diagnosis not present

## 2021-12-16 NOTE — Assessment & Plan Note (Signed)
Chronic, stable ?Hx of hyperglycemia up to 140s on labs ?Repeat A1c ?Continue metformin xr 500 mg BID ?Due for urine micro-unable to complete today ?Not on ace d/t hypotension ?Not on statin d/t allergy ?

## 2021-12-16 NOTE — Assessment & Plan Note (Signed)
Chronic concern, unknown organ ?Refused examination ?Has been interfering with workouts, urinary patterns, and clothing choice ?Some irritation when walking  ?Spoke with insurance who is recommending f/u with urogyn for possible pessary fitting or surgical intervention  ?

## 2021-12-16 NOTE — Progress Notes (Signed)
?  ? ?I,Sulibeya S Dimas,acting as a scribe for Gwyneth Sprout, FNP.,have documented all relevant documentation on the behalf of Gwyneth Sprout, FNP,as directed by  Gwyneth Sprout, FNP while in the presence of Gwyneth Sprout, FNP. ? ? ?Established patient visit ? ? ?Patient: Christine Richards.   DOB: May 01, 1946   76 y.o. Female  MRN: [id number] ?Visit Date: 12/16/2021 ? ?Today's healthcare provider: Gwyneth Sprout, FNP  ?Re Introduced to nurse practitioner role and practice setting.  All questions answered.  Discussed provider/patient relationship and expectations. ? ? ?Chief Complaint  ?Patient presents with  ? Referral  ? ?Subjective  ?  ?HPI  ?Patient here requesting a referral for prolapse. Patient is requesting referral to urogynecology. Patient reports she go to the bathroom a lot and most of the time noting happens.  ? ?Medications: ?Outpatient Medications Prior to Visit  ?Medication Sig  ? Blood Glucose Monitoring Suppl ([place]) w/Device KIT New kit with device to check blood sugar once daily  ? Cholecalciferol (VITAMIN D) 2000 UNITS CAPS Take 4,000 Units by mouth daily.  ? Cyanocobalamin (VITAMIN B-12) 1000 MCG SUBL Place 1 tablet (1,000 mcg total) under the tongue daily.  ? glucose blood (ONETOUCH VERIO) test strip CHECK BLOOD SUGAR TWICE DAILY  ? Lancets 33G MISC Use twice daily to check blood sugars.  ? metFORMIN (GLUCOPHAGE-XR) 500 MG 24 hr tablet Take 1 tablet (500 mg total) by mouth 2 (two) times daily with a meal.  ? Omega-3 Fatty Acids (FISH OIL) 1000 MG CAPS Take 1,000-2,000 mg by mouth at bedtime.   ? ?No facility-administered medications prior to visit.  ? ? ?Review of Systems  ?Constitutional:  Negative for chills and fatigue.  ?Gastrointestinal:  Negative for abdominal pain, constipation, diarrhea, nausea and vomiting.  ?Genitourinary:  Positive for difficulty urinating and frequency. Negative for dysuria.  ? ? ?  Objective  ?  ?BP (!) 108/59 (BP Location: Right Arm, Patient Position:  Sitting, Cuff Size: Large)   Pulse 78   Temp 98.5 ?F (36.9 ?C) (Oral)   Resp 16   Wt 160 lb (72.6 kg)   BMI 26.63 kg/m?  ?  ? ?Physical Exam ?Vitals and nursing note reviewed.  ?Constitutional:   ?   General: She is not in acute distress. ?   Appearance: Normal appearance. She is overweight. She is not ill-appearing, toxic-appearing or diaphoretic.  ?HENT:  ?   Head: Normocephalic and atraumatic.  ?Cardiovascular:  ?   Rate and Rhythm: Normal rate and regular rhythm.  ?   Pulses: Normal pulses.  ?   Heart sounds: Normal heart sounds. No murmur heard. ?  No friction rub. No gallop.  ?Pulmonary:  ?   Effort: Pulmonary effort is normal. No respiratory distress.  ?   Breath sounds: Normal breath sounds. No stridor. No wheezing, rhonchi or rales.  ?Chest:  ?   Chest wall: No tenderness.  ?Abdominal:  ?   General: Bowel sounds are normal.  ?   Palpations: Abdomen is soft.  ?Musculoskeletal:     ?   General: No swelling, tenderness, deformity or signs of injury. Normal range of motion.  ?   Right lower leg: No edema.  ?   Left lower leg: No edema.  ?Skin: ?   General: Skin is warm and dry.  ?   Capillary Refill: Capillary refill takes less than 2 seconds.  ?   Coloration: Skin is not jaundiced or pale.  ?  Findings: No bruising, erythema, lesion or rash.  ?Neurological:  ?   General: No focal deficit present.  ?   Mental Status: She is alert and oriented to person, place, and time. Mental status is at baseline.  ?   Cranial Nerves: No cranial nerve deficit.  ?   Sensory: No sensory deficit.  ?   Motor: No weakness.  ?   Coordination: Coordination normal.  ?Psychiatric:     ?   Mood and Affect: Mood is anxious.     ?   Behavior: Behavior normal.     ?   Thought Content: Thought content normal.     ?   Judgment: Judgment normal.  ?  ? ? ?No results found for any visits on 12/16/21. ? Assessment & Plan  ?  ? ?Problem List Items Addressed This Visit   ? ?  ? Endocrine  ? Type 2 diabetes mellitus with hyperglycemia,  without long-term current use of insulin ([place])  ?  Chronic, stable ?Hx of hyperglycemia up to 140s on labs ?Repeat A1c ?Continue metformin xr 500 mg BID ?Due for urine micro-unable to complete today ?Not on ace d/t hypotension ?Not on statin d/t allergy ? ?  ?  ? Relevant Orders  ? Hemoglobin A1c  ?  ? Genitourinary  ? Female genital prolapse - Primary  ?  Chronic concern, unknown organ ?Refused examination ?Has been interfering with workouts, urinary patterns, and clothing choice ?Some irritation when walking  ?Spoke with insurance who is recommending f/u with urogyn for possible pessary fitting or surgical intervention  ? ?  ?  ? Relevant Orders  ? Ambulatory referral to Urogynecology  ? ? ? ?Return in about 3 months (around 03/17/2022) for annual examination.  ?   ? ?I, Gwyneth Sprout, FNP, have reviewed all documentation for this visit. The documentation on 12/16/21 for the exam, diagnosis, procedures, and orders are all accurate and complete. ? ? ? ?Gwyneth Sprout, FNP  ?[place] ?[phone] (phone) ?[phone] (fax) ? ?[facility] ?

## 2021-12-17 ENCOUNTER — Encounter: Payer: Self-pay | Admitting: Family Medicine

## 2021-12-17 LAB — HEMOGLOBIN A1C
Est. average glucose Bld gHb Est-mCnc: 166 mg/dL
Hgb A1c MFr Bld: 7.4 % — ABNORMAL HIGH (ref 4.8–5.6)

## 2021-12-27 ENCOUNTER — Other Ambulatory Visit: Payer: Self-pay | Admitting: Family Medicine

## 2022-01-18 DIAGNOSIS — N3946 Mixed incontinence: Secondary | ICD-10-CM | POA: Diagnosis not present

## 2022-01-18 DIAGNOSIS — N398 Other specified disorders of urinary system: Secondary | ICD-10-CM | POA: Diagnosis not present

## 2022-01-18 DIAGNOSIS — N813 Complete uterovaginal prolapse: Secondary | ICD-10-CM | POA: Diagnosis not present

## 2022-01-21 ENCOUNTER — Other Ambulatory Visit: Payer: Self-pay | Admitting: Family Medicine

## 2022-01-21 DIAGNOSIS — E119 Type 2 diabetes mellitus without complications: Secondary | ICD-10-CM

## 2022-01-21 DIAGNOSIS — Z4689 Encounter for fitting and adjustment of other specified devices: Secondary | ICD-10-CM | POA: Diagnosis not present

## 2022-01-21 DIAGNOSIS — N952 Postmenopausal atrophic vaginitis: Secondary | ICD-10-CM | POA: Diagnosis not present

## 2022-02-11 ENCOUNTER — Encounter: Payer: Self-pay | Admitting: Family Medicine

## 2022-02-11 DIAGNOSIS — E1169 Type 2 diabetes mellitus with other specified complication: Secondary | ICD-10-CM

## 2022-02-11 NOTE — Telephone Encounter (Signed)
Patient scheduled for tomorrow

## 2022-02-12 ENCOUNTER — Ambulatory Visit (INDEPENDENT_AMBULATORY_CARE_PROVIDER_SITE_OTHER): Payer: Medicare HMO | Admitting: Family Medicine

## 2022-02-12 ENCOUNTER — Encounter: Payer: Self-pay | Admitting: Family Medicine

## 2022-02-12 VITALS — BP 130/72 | HR 74 | Temp 97.6°F | Resp 16 | Ht 66.0 in | Wt 161.1 lb

## 2022-02-12 DIAGNOSIS — R32 Unspecified urinary incontinence: Secondary | ICD-10-CM | POA: Diagnosis not present

## 2022-02-12 DIAGNOSIS — E1165 Type 2 diabetes mellitus with hyperglycemia: Secondary | ICD-10-CM | POA: Diagnosis not present

## 2022-02-12 DIAGNOSIS — R531 Weakness: Secondary | ICD-10-CM | POA: Diagnosis not present

## 2022-02-12 DIAGNOSIS — Z1331 Encounter for screening for depression: Secondary | ICD-10-CM | POA: Diagnosis not present

## 2022-02-12 DIAGNOSIS — R42 Dizziness and giddiness: Secondary | ICD-10-CM | POA: Diagnosis not present

## 2022-02-12 DIAGNOSIS — R0609 Other forms of dyspnea: Secondary | ICD-10-CM

## 2022-02-12 LAB — POCT URINALYSIS DIPSTICK
Bilirubin, UA: NEGATIVE
Blood, UA: NEGATIVE
Glucose, UA: POSITIVE — AB
Ketones, UA: 5
Leukocytes, UA: NEGATIVE
Nitrite, UA: NEGATIVE
Protein, UA: NEGATIVE
Spec Grav, UA: 1.02 (ref 1.010–1.025)
Urobilinogen, UA: 0.2 E.U./dL
pH, UA: 6.5 (ref 5.0–8.0)

## 2022-02-12 MED ORDER — METFORMIN HCL ER 500 MG PO TB24: 500.0000 mg | ORAL_TABLET | Freq: Two times a day (BID) | ORAL | 0 refills | Status: DC

## 2022-02-12 NOTE — Assessment & Plan Note (Signed)
Acute, new concern since starting metformin FBGs have been 130s Is active drinking water and working out Recommend washout of Metformin and eval by PT

## 2022-02-12 NOTE — Assessment & Plan Note (Signed)
Complaints of DOE with stair climbing; reports resting heart rate in high 50's. HR during VS was 74 Encourage regular meals and trial of wash out of metformin Encourage follow up with vascular/cardiology for carotid US

## 2022-02-12 NOTE — Assessment & Plan Note (Signed)
Acute x2 months; slight improvement noted with B-12 supplements Recommend PT Repeat A1c and B12 levels in 1 month for 3 month interval Denies falls; however, has been more cautious with weight lifting d/t fear for falls d/t weakness.

## 2022-02-12 NOTE — Progress Notes (Signed)
Established patient visit   Patient: Christine Richards.   DOB: 09/10/1945   76 y.o. Female  MRN: [id number] Visit Date: 02/12/2022  Today's healthcare provider: Gwyneth Sprout, FNP  Introduced to nurse practitioner role and practice setting.  All questions answered.  Discussed provider/patient relationship and expectations.   I,Tiffany J Bragg,acting as a scribe for Gwyneth Sprout, FNP.,have documented all relevant documentation on the behalf of Gwyneth Sprout, FNP,as directed by  Gwyneth Sprout, FNP while in the presence of Gwyneth Sprout, FNP.   Chief Complaint  Patient presents with   Fatigue    Patient complains of fatigue, clouded mind, and weakness starting 3 weeks ago with symptoms worsening in the last 4 days.    Subjective    HPI HPI     Fatigue    Additional comments: Patient complains of fatigue, clouded mind, and weakness starting 3 weeks ago with symptoms worsening in the last 4 days.       Last edited by Smitty Knudsen, CMA on 02/12/2022 10:34 AM.       Medications: Outpatient Medications Prior to Visit  Medication Sig   Blood Glucose Monitoring Suppl ([place]) w/Device KIT New kit with device to check blood sugar once daily   Cholecalciferol (VITAMIN D) 2000 UNITS CAPS Take 4,000 Units by mouth daily.   Cyanocobalamin (VITAMIN B-12) 1000 MCG SUBL Place 1 tablet (1,000 mcg total) under the tongue daily.   estradiol (ESTRACE) 0.1 MG/GM vaginal cream Place 1 Applicatorful vaginally at bedtime.   glucose blood (ONETOUCH VERIO) test strip CHECK BLOOD SUGAR TWICE DAILY   Lancets 33G MISC Use twice daily to check blood sugars.   Omega-3 Fatty Acids (FISH OIL) 1000 MG CAPS Take 1,000-2,000 mg by mouth at bedtime.    [DISCONTINUED] metFORMIN (GLUCOPHAGE-XR) 500 MG 24 hr tablet TAKE 1 TABLET TWICE DAILY WITH MEALS   No facility-administered medications prior to visit.    Review of Systems     Objective    BP 130/72 (BP Location: Right Arm,  Patient Position: Sitting, Cuff Size: Normal)   Pulse 74   Temp 97.6 F (36.4 C) (Oral)   Resp 16   Ht 5' 6"  (1.676 m)   Wt 161 lb 1.6 oz (73.1 kg)   SpO2 97%   BMI 26.00 kg/m    Physical Exam Vitals and nursing note reviewed.  Constitutional:      General: She is not in acute distress.    Appearance: Normal appearance. She is overweight. She is not ill-appearing, toxic-appearing or diaphoretic.  HENT:     Head: Normocephalic and atraumatic.  Cardiovascular:     Rate and Rhythm: Normal rate and regular rhythm.     Pulses: Normal pulses.     Heart sounds: Normal heart sounds. No murmur heard.    No friction rub. No gallop.  Pulmonary:     Effort: Pulmonary effort is normal. No respiratory distress.     Breath sounds: Normal breath sounds. No stridor. No wheezing, rhonchi or rales.  Chest:     Chest wall: No tenderness.  Musculoskeletal:        General: No swelling, tenderness, deformity or signs of injury. Normal range of motion.     Right lower leg: No edema.     Left lower leg: No edema.  Skin:    General: Skin is warm and dry.     Capillary Refill: Capillary refill takes less than 2 seconds.  Coloration: Skin is not jaundiced or pale.     Findings: No bruising, erythema, lesion or rash.  Neurological:     General: No focal deficit present.     Mental Status: She is alert and oriented to person, place, and time. Mental status is at baseline.     Cranial Nerves: No cranial nerve deficit.     Sensory: No sensory deficit.     Motor: No weakness.     Coordination: Coordination normal.  Psychiatric:        Mood and Affect: Mood is anxious and depressed.        Behavior: Behavior normal.        Thought Content: Thought content normal.        Judgment: Judgment normal.      Results for orders placed or performed in visit on 02/12/22  POCT Urinalysis Dipstick  Result Value Ref Range   Color, UA yellow    Clarity, UA clear    Glucose, UA Positive (A) Negative    Bilirubin, UA negative    Ketones, UA 5    Spec Grav, UA 1.020 1.010 - 1.025   Blood, UA negative    pH, UA 6.5 5.0 - 8.0   Protein, UA Negative Negative   Urobilinogen, UA 0.2 0.2 or 1.0 E.U./dL   Nitrite, UA negative    Leukocytes, UA Negative Negative   Appearance     Odor      Assessment & Plan     Problem List Items Addressed This Visit       Endocrine   Type 2 diabetes mellitus with hyperglycemia, without long-term current use of insulin (HCC)    Chronic, increase in A1c noted in last 6 months Due for A1c in 1 month However, pt is now complaining of dizziness/weakness/fear of falling and hair loss Recommend 3 week wash out of medication FBGs have been in 130s Continue to recommend balanced, lower carb meals. Smaller meal size, adding snacks. Choosing water as drink of choice and increasing purposeful exercise. Urine micro repeated today for glucose on UA      Relevant Medications   metFORMIN (GLUCOPHAGE-XR) 500 MG 24 hr tablet   Other Relevant Orders   Urine Microalbumin w/creat. ratio     Other   Dizziness - Primary    Acute, new concern since starting metformin FBGs have been 130s Is active drinking water and working out Recommend washout of Metformin and eval by PT      Relevant Orders   Urinalysis, Routine w reflex microscopic   Ambulatory referral to Physical Therapy   Dyspnea on exertion    Complaints of DOE with stair climbing; reports resting heart rate in high 50's. 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 Has family nearby. Ultimately worried that she will not be able to care for herself for her entire life based on the current  trends  Denies desire to start medication to assist       Weakness generalized    Acute x2 months; slight improvement noted with B-12 supplements Recommend PT Repeat A1c and B12 levels in 1 month for 3 month interval Denies falls; however, has been more cautious with weight lifting d/t fear for falls d/t weakness.       Relevant Orders   Urinalysis, Routine w reflex microscopic   POCT Urinalysis Dipstick (Completed)   Urine Culture   Ambulatory referral to Physical Therapy     Return in about 4 weeks (around 03/12/2022) for chonic disease management.      Vonna Kotyk, FNP, have reviewed all documentation for this visit. The documentation on 02/12/22 for the exam, diagnosis, procedures, and orders are all accurate and complete.    Gwyneth Sprout, [place] [phone] (phone) [phone] (fax)  [place]

## 2022-02-12 NOTE — Patient Instructions (Signed)
The CDC recommends two doses of Shingrix (the new shingles vaccine) separated by 2 to 6 months for adults age 76 years and older. I recommend checking with your insurance plan regarding coverage for this vaccine.    

## 2022-02-12 NOTE — Assessment & Plan Note (Signed)
Chronic, increase in A1c noted in last 6 months Due for A1c in 1 month However, pt is now complaining of dizziness/weakness/fear of falling and hair loss Recommend 3 week wash out of medication FBGs have been in 130s Continue to recommend balanced, lower carb meals. Smaller meal size, adding snacks. Choosing water as drink of choice and increasing purposeful exercise. Urine micro repeated today for glucose on UA

## 2022-02-12 NOTE — Assessment & Plan Note (Signed)
Acute, linked to chronic health issues -new prolapse -got hearing aids -A1c increase Denies SI or HI; contracted to safety. Has family nearby. Ultimately worried that she will not be able to care for herself for her entire life based on the current trends  Denies desire to start medication to assist

## 2022-02-12 NOTE — Assessment & Plan Note (Signed)
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

## 2022-02-13 LAB — URINALYSIS, ROUTINE W REFLEX MICROSCOPIC
Bilirubin, UA: NEGATIVE
Nitrite, UA: NEGATIVE
Protein,UA: NEGATIVE
RBC, UA: NEGATIVE
Specific Gravity, UA: 1.025 (ref 1.005–1.030)
Urobilinogen, Ur: 1 mg/dL (ref 0.2–1.0)
pH, UA: 5.5 (ref 5.0–7.5)

## 2022-02-13 LAB — MICROALBUMIN / CREATININE URINE RATIO
Creatinine, Urine: 89.5 mg/dL
Microalb/Creat Ratio: 11 mg/g creat (ref 0–29)
Microalbumin, Urine: 10.2 ug/mL

## 2022-02-13 LAB — MICROSCOPIC EXAMINATION
Casts: NONE SEEN /lpf
Epithelial Cells (non renal): >10 /hpf — AB (ref 0–10)

## 2022-02-15 LAB — URINE CULTURE

## 2022-02-16 NOTE — Progress Notes (Signed)
Normal urine micro albumin at 10.2. Repeat in 1 year. Urine micro scope review of sample showed high level of skin cells, and low levels which would indication infection. Please follow up with us/GYN if symptoms change. Urine also showed 2+ glucose on reflex. Continue to recommend balanced, lower carb meals. Smaller meal size, adding snacks. Choosing water as drink of choice and increasing purposeful exercise. Gwyneth Sprout, [place] [address] [place], [place] [address] [phone] (phone) [phone] (fax) [place]

## 2022-02-25 DIAGNOSIS — R151 Fecal smearing: Secondary | ICD-10-CM | POA: Diagnosis not present

## 2022-02-25 DIAGNOSIS — N3946 Mixed incontinence: Secondary | ICD-10-CM | POA: Diagnosis not present

## 2022-02-25 DIAGNOSIS — Z4689 Encounter for fitting and adjustment of other specified devices: Secondary | ICD-10-CM | POA: Diagnosis not present

## 2022-02-26 NOTE — Progress Notes (Unsigned)
      Established patient visit   Patient: Christine Richards.   DOB: 1945/11/24   76 y.o. Female  MRN: [id number] Visit Date: 03/16/2022  Today's healthcare provider: Gwyneth Sprout, FNP   No chief complaint on file.  Subjective    HPI  Diabetes Mellitus Type II, Follow-up  Lab Results  Component Value Date   HGBA1C 7.4 (H) 12/16/2021   HGBA1C 6.7 (A) 06/18/2021   HGBA1C 7.3 (H) 05/22/2021   Wt Readings from Last 3 Encounters:  02/12/22 161 lb 1.6 oz (73.1 kg)  12/16/21 160 lb (72.6 kg)  09/10/21 162 lb 3.2 oz (73.6 kg)   Last seen for diabetes 3 months ago.  Management since then includes continue metformin xr 500 mg BID. She reports {excellent/good/fair/poor:19665} compliance with treatment. She {is/is not:21021397} having side effects. {document side effects if present:1} Symptoms: {Yes/No:20286} fatigue {Yes/No:20286} foot ulcerations  {Yes/No:20286} appetite changes {Yes/No:20286} nausea  {Yes/No:20286} paresthesia of the feet  {Yes/No:20286} polydipsia  {Yes/No:20286} polyuria {Yes/No:20286} visual disturbances   {Yes/No:20286} vomiting     Home blood sugar records: {diabetes glucometry results:16657}  Episodes of hypoglycemia? {Yes/No:20286} {enter symptoms and frequency of symptoms if yes:1}   Current insulin regiment: {enter 'none' or type of insulin and number of units taken with each dose of each insulin formulation that the patient is taking:1} Most Recent Eye Exam: *** {Current exercise:16438:::1} {Current diet habits:16563:::1}  Pertinent Labs: Lab Results  Component Value Date   CHOL 396 (H) 01/20/2021   HDL 60 01/20/2021   LDLCALC 279 (H) 01/20/2021   TRIG 264 (H) 01/20/2021   CHOLHDL 6.6 (H) 01/20/2021   Lab Results  Component Value Date   NA 144 01/20/2021   K 4.1 01/20/2021   CREATININE 0.76 01/20/2021   EGFR 82 01/20/2021   MICROALBUR 10 03/21/2021   LABMICR See below: 02/12/2022   LABMICR 10.2 02/12/2022      ---------------------------------------------------------------------------------------------------   Medications: Outpatient Medications Prior to Visit  Medication Sig   Blood Glucose Monitoring Suppl ([place]) w/Device KIT New kit with device to check blood sugar once daily   Cholecalciferol (VITAMIN D) 2000 UNITS CAPS Take 4,000 Units by mouth daily.   Cyanocobalamin (VITAMIN B-12) 1000 MCG SUBL Place 1 tablet (1,000 mcg total) under the tongue daily.   estradiol (ESTRACE) 0.1 MG/GM vaginal cream Place 1 Applicatorful vaginally at bedtime.   glucose blood (ONETOUCH VERIO) test strip CHECK BLOOD SUGAR TWICE DAILY   Lancets 33G MISC Use twice daily to check blood sugars.   metFORMIN (GLUCOPHAGE-XR) 500 MG 24 hr tablet Take 1 tablet (500 mg total) by mouth 2 (two) times daily with a meal. Hold for 3 weeks for washout for complaints of dizziness/weakness/concern for falls.   Omega-3 Fatty Acids (FISH OIL) 1000 MG CAPS Take 1,000-2,000 mg by mouth at bedtime.    No facility-administered medications prior to visit.    Review of Systems  {Labs  Heme  Chem  Endocrine  Serology  Results Review (optional):23779}   Objective    There were no vitals taken for this visit. {Show previous vital signs (optional):23777}  Physical Exam  ***  No results found for any visits on 03/16/22.  Assessment & Plan     ***  No follow-ups on file.      {provider attestation***:1}   Gwyneth Sprout, [place] [phone] (phone) [phone] (fax)  [place]

## 2022-03-16 ENCOUNTER — Ambulatory Visit (INDEPENDENT_AMBULATORY_CARE_PROVIDER_SITE_OTHER): Payer: Medicare HMO | Admitting: Family Medicine

## 2022-03-16 ENCOUNTER — Encounter: Payer: Self-pay | Admitting: Family Medicine

## 2022-03-16 VITALS — BP 133/66 | HR 67 | Temp 97.8°F | Resp 16 | Ht 66.0 in | Wt 161.0 lb

## 2022-03-16 DIAGNOSIS — M5442 Lumbago with sciatica, left side: Secondary | ICD-10-CM | POA: Diagnosis not present

## 2022-03-16 DIAGNOSIS — E1165 Type 2 diabetes mellitus with hyperglycemia: Secondary | ICD-10-CM

## 2022-03-16 LAB — POCT GLYCOSYLATED HEMOGLOBIN (HGB A1C)
Est. average glucose Bld gHb Est-mCnc: 166
Hemoglobin A1C: 7.4 % — AB (ref 4.0–5.6)

## 2022-03-16 MED ORDER — MELOXICAM 15 MG PO TABS: 15.0000 mg | ORAL_TABLET | Freq: Every day | ORAL | 0 refills | Status: DC

## 2022-03-16 NOTE — Assessment & Plan Note (Signed)
Chronic, stable A1c 7.4% Continue to recommend balanced, lower carb meals. Smaller meal size, adding snacks. Choosing water as drink of choice and increasing purposeful exercise. Continue off metformin d/t dizziness and nausea Goal A1c <8%

## 2022-03-16 NOTE — Assessment & Plan Note (Signed)
Noted a pull at gym x2 occasions; has been using ADVIL to assist, 400 mg every 4 hours, PRN Recommend use of Mobic and exercises; has upcoming pelvic floor PT appt and wishes to defer additional PT at this time RTC if needed

## 2022-03-22 ENCOUNTER — Telehealth: Payer: Self-pay

## 2022-03-22 NOTE — Telephone Encounter (Signed)
Copied from [place] [id number]. Topic: General - Call Back - No Documentation >> Mar 22, 2022 10:56 AM Sabas Sous wrote: Reason for CRM: Pt called requesting to speak to [facility] [address] the pharmacist  Says PCP has taken her off of her diabetic medication and now her numbers are high daily, wants advice.   Best contact: [phone]

## 2022-03-23 NOTE — Telephone Encounter (Signed)
Patient visit scheduled for 03/30/22.

## 2022-03-29 ENCOUNTER — Ambulatory Visit
Admission: RE | Admit: 2022-03-29 | Discharge: 2022-03-29 | Disposition: A | Discharge status: Home / Self Care | Payer: Medicare HMO | Source: Ambulatory Visit | Attending: Emergency Medicine | Admitting: Emergency Medicine

## 2022-03-29 ENCOUNTER — Other Ambulatory Visit: Payer: Self-pay | Admitting: Emergency Medicine

## 2022-03-29 DIAGNOSIS — M5442 Lumbago with sciatica, left side: Secondary | ICD-10-CM

## 2022-03-29 DIAGNOSIS — M5136 Other intervertebral disc degeneration, lumbar region: Secondary | ICD-10-CM | POA: Diagnosis not present

## 2022-03-29 DIAGNOSIS — M545 Low back pain, unspecified: Secondary | ICD-10-CM | POA: Diagnosis not present

## 2022-03-30 ENCOUNTER — Telehealth: Payer: Medicare HMO

## 2022-04-01 DIAGNOSIS — M79604 Pain in right leg: Secondary | ICD-10-CM | POA: Diagnosis not present

## 2022-04-01 DIAGNOSIS — M5459 Other low back pain: Secondary | ICD-10-CM | POA: Diagnosis not present

## 2022-04-01 DIAGNOSIS — M79605 Pain in left leg: Secondary | ICD-10-CM | POA: Diagnosis not present

## 2022-04-07 DIAGNOSIS — M79605 Pain in left leg: Secondary | ICD-10-CM | POA: Diagnosis not present

## 2022-04-07 DIAGNOSIS — M79604 Pain in right leg: Secondary | ICD-10-CM | POA: Diagnosis not present

## 2022-04-07 DIAGNOSIS — M5459 Other low back pain: Secondary | ICD-10-CM | POA: Diagnosis not present

## 2022-04-09 DIAGNOSIS — M79604 Pain in right leg: Secondary | ICD-10-CM | POA: Diagnosis not present

## 2022-04-09 DIAGNOSIS — M5459 Other low back pain: Secondary | ICD-10-CM | POA: Diagnosis not present

## 2022-04-09 DIAGNOSIS — M79605 Pain in left leg: Secondary | ICD-10-CM | POA: Diagnosis not present

## 2022-04-11 ENCOUNTER — Other Ambulatory Visit: Payer: Self-pay | Admitting: Family Medicine

## 2022-04-11 DIAGNOSIS — M5442 Lumbago with sciatica, left side: Secondary | ICD-10-CM

## 2022-04-12 NOTE — Telephone Encounter (Signed)
Labs not required since not been on medication 8 weeks per protocol.  Requested Prescriptions  Pending Prescriptions Disp Refills  . meloxicam (MOBIC) 15 MG tablet [Pharmacy Med Name: MELOXICAM 15 MG TABLET] 30 tablet 0    Sig: TAKE 1 TABLET BY MOUTH DAILY     Analgesics:  COX2 Inhibitors Failed - 04/11/2022  6:51 AM      Failed - Manual Review: Labs are only required if the patient has taken medication for more than 8 weeks.      Failed - HGB in normal range and within 360 days    Hemoglobin  Date Value Ref Range Status  04/18/2020 13.8 11.1 - 15.9 g/dL Final         Failed - Cr in normal range and within 360 days    Creatinine  Date Value Ref Range Status  03/17/2013 0.72 0.60 - 1.30 mg/dL Final   Creatinine, Ser  Date Value Ref Range Status  01/20/2021 0.76 0.57 - 1.00 mg/dL Final         Failed - HCT in normal range and within 360 days    Hematocrit  Date Value Ref Range Status  04/18/2020 41.7 34.0 - 46.6 % Final         Failed - AST in normal range and within 360 days    AST  Date Value Ref Range Status  01/20/2021 15 0 - 40 IU/L Final   SGOT(AST)  Date Value Ref Range Status  03/17/2013 20 15 - 37 Unit/L Final         Failed - ALT in normal range and within 360 days    ALT  Date Value Ref Range Status  01/20/2021 16 0 - 32 IU/L Final   SGPT (ALT)  Date Value Ref Range Status  03/17/2013 23 12 - 78 U/L Final         Failed - eGFR is 30 or above and within 360 days    EGFR (African American)  Date Value Ref Range Status  03/17/2013 >60  Final   GFR calc Af Amer  Date Value Ref Range Status  04/18/2020 101 >59 mL/min/1.73 Final    Comment:    **[facility] currently reports eGFR in compliance with the current**   recommendations of the [employer]. [facility] will   update reporting as new guidelines are published from the NKF-ASN   Task force.    EGFR (Non-African Amer.)  Date Value Ref Range Status  03/17/2013 >60  Final    Comment:     eGFR values <40m/min/1.73 m2 may be an indication of chronic kidney disease (CKD). Calculated eGFR is useful in patients with stable renal function. The eGFR calculation will not be reliable in acutely ill patients when serum creatinine is changing rapidly. It is not useful in  patients on dialysis. The eGFR calculation may not be applicable to patients at the low and high extremes of body sizes, pregnant women, and vegetarians.    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

## 2022-04-13 DIAGNOSIS — M79605 Pain in left leg: Secondary | ICD-10-CM | POA: Diagnosis not present

## 2022-04-13 DIAGNOSIS — M5459 Other low back pain: Secondary | ICD-10-CM | POA: Diagnosis not present

## 2022-04-13 DIAGNOSIS — M79604 Pain in right leg: Secondary | ICD-10-CM | POA: Diagnosis not present

## 2022-04-15 DIAGNOSIS — M79605 Pain in left leg: Secondary | ICD-10-CM | POA: Diagnosis not present

## 2022-04-15 DIAGNOSIS — M79604 Pain in right leg: Secondary | ICD-10-CM | POA: Diagnosis not present

## 2022-04-15 DIAGNOSIS — M5459 Other low back pain: Secondary | ICD-10-CM | POA: Diagnosis not present

## 2022-04-20 ENCOUNTER — Other Ambulatory Visit: Payer: Self-pay | Admitting: Family Medicine

## 2022-04-20 DIAGNOSIS — M79605 Pain in left leg: Secondary | ICD-10-CM | POA: Diagnosis not present

## 2022-04-20 DIAGNOSIS — M5459 Other low back pain: Secondary | ICD-10-CM | POA: Diagnosis not present

## 2022-04-20 DIAGNOSIS — M79604 Pain in right leg: Secondary | ICD-10-CM | POA: Diagnosis not present

## 2022-04-21 ENCOUNTER — Other Ambulatory Visit: Payer: Self-pay

## 2022-04-21 DIAGNOSIS — E119 Type 2 diabetes mellitus without complications: Secondary | ICD-10-CM

## 2022-04-21 MED ORDER — BLOOD GLUCOSE METER KIT: PACK | 0 refills | Status: DC

## 2022-04-22 DIAGNOSIS — M79604 Pain in right leg: Secondary | ICD-10-CM | POA: Diagnosis not present

## 2022-04-22 DIAGNOSIS — M5459 Other low back pain: Secondary | ICD-10-CM | POA: Diagnosis not present

## 2022-04-22 DIAGNOSIS — M79605 Pain in left leg: Secondary | ICD-10-CM | POA: Diagnosis not present

## 2022-04-27 DIAGNOSIS — M79604 Pain in right leg: Secondary | ICD-10-CM | POA: Diagnosis not present

## 2022-04-27 DIAGNOSIS — M79605 Pain in left leg: Secondary | ICD-10-CM | POA: Diagnosis not present

## 2022-04-27 DIAGNOSIS — M5459 Other low back pain: Secondary | ICD-10-CM | POA: Diagnosis not present

## 2022-04-29 DIAGNOSIS — M79604 Pain in right leg: Secondary | ICD-10-CM | POA: Diagnosis not present

## 2022-04-29 DIAGNOSIS — M79605 Pain in left leg: Secondary | ICD-10-CM | POA: Diagnosis not present

## 2022-04-29 DIAGNOSIS — M5459 Other low back pain: Secondary | ICD-10-CM | POA: Diagnosis not present

## 2022-05-07 DIAGNOSIS — H353131 Nonexudative age-related macular degeneration, bilateral, early dry stage: Secondary | ICD-10-CM | POA: Diagnosis not present

## 2022-05-07 DIAGNOSIS — Z0100 Encounter for examination of eyes and vision without abnormal findings: Secondary | ICD-10-CM | POA: Diagnosis not present

## 2022-05-07 LAB — HM DIABETES EYE EXAM

## 2022-05-11 DIAGNOSIS — M79604 Pain in right leg: Secondary | ICD-10-CM | POA: Diagnosis not present

## 2022-05-11 DIAGNOSIS — M79605 Pain in left leg: Secondary | ICD-10-CM | POA: Diagnosis not present

## 2022-05-11 DIAGNOSIS — M5459 Other low back pain: Secondary | ICD-10-CM | POA: Diagnosis not present

## 2022-05-13 DIAGNOSIS — M79605 Pain in left leg: Secondary | ICD-10-CM | POA: Diagnosis not present

## 2022-05-13 DIAGNOSIS — M79604 Pain in right leg: Secondary | ICD-10-CM | POA: Diagnosis not present

## 2022-05-13 DIAGNOSIS — M5459 Other low back pain: Secondary | ICD-10-CM | POA: Diagnosis not present

## 2022-05-27 DIAGNOSIS — Z4689 Encounter for fitting and adjustment of other specified devices: Secondary | ICD-10-CM | POA: Diagnosis not present

## 2022-06-15 ENCOUNTER — Ambulatory Visit (INDEPENDENT_AMBULATORY_CARE_PROVIDER_SITE_OTHER): Payer: Medicare HMO | Admitting: Family Medicine

## 2022-06-15 DIAGNOSIS — Z91199 Patient's noncompliance with other medical treatment and regimen due to unspecified reason: Secondary | ICD-10-CM

## 2022-06-16 ENCOUNTER — Encounter: Payer: Self-pay | Admitting: Family Medicine

## 2022-06-16 ENCOUNTER — Ambulatory Visit (INDEPENDENT_AMBULATORY_CARE_PROVIDER_SITE_OTHER): Payer: Medicare HMO | Admitting: Family Medicine

## 2022-06-16 VITALS — BP 123/67 | HR 67 | Temp 98.3°F | Resp 16 | Ht 66.0 in | Wt 152.0 lb

## 2022-06-16 DIAGNOSIS — Z960 Presence of urogenital implants: Secondary | ICD-10-CM | POA: Diagnosis not present

## 2022-06-16 DIAGNOSIS — R42 Dizziness and giddiness: Secondary | ICD-10-CM | POA: Diagnosis not present

## 2022-06-16 DIAGNOSIS — Z0000 Encounter for general adult medical examination without abnormal findings: Secondary | ICD-10-CM | POA: Insufficient documentation

## 2022-06-16 NOTE — Assessment & Plan Note (Signed)
Request for referral to local provided for 08/2022 cleaning/removal Denies complaints/concerns at this time

## 2022-06-16 NOTE — Assessment & Plan Note (Signed)
Declines referral to GI for repeat colon cancer screening Noted going off of metformin for a bit; however, FBG elevated to 180s-200 and has resumed x1/day Things to do to keep yourself healthy  - Exercise at least 30-45 minutes a day, 3-4 days a week.  - Eat a low-fat diet with lots of fruits and vegetables, up to 7-9 servings per day.  - Seatbelts can save your life. Wear them always.  - Smoke detectors on every level of your home, check batteries every year.  - Eye Doctor - have an eye exam every 1-2 years  - Safe sex - if you may be exposed to STDs, use a condom.  - Alcohol -  If you drink, do it moderately, less than 2 drinks per day.  - [place]. Choose someone to speak for you if you are not able.  - Depression is common in our stressful world.If you're feeling down or losing interest in things you normally enjoy, please come in for a visit.  - Violence - If anyone is threatening or hurting you, please call immediately.

## 2022-06-16 NOTE — Progress Notes (Signed)
Patient was not seen d/t cancellation.

## 2022-06-16 NOTE — Progress Notes (Signed)
Complete physical exam   Patient: Christine Richards.   DOB: 1945/10/20   76 y.o. Female  MRN: [id number] Visit Date: 06/16/2022  Today's healthcare provider: Gwyneth Sprout, FNP  Introduced to nurse practitioner role and practice setting.  All questions answered.  Discussed provider/patient relationship and expectations.  I,Tiffany J Bragg,acting as a scribe for Gwyneth Sprout, FNP.,have documented all relevant documentation on the behalf of Gwyneth Sprout, FNP,as directed by  Gwyneth Sprout, FNP while in the presence of Gwyneth Sprout, FNP.   Chief Complaint  Patient presents with   Annual Exam   Subjective    Christine Richards is a 76 y.o. female who presents today for a complete physical exam.  She reports consuming a diabetic, low calorie diet. 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 She generally feels well. She reports sleeping well. She does not have additional problems to discuss today.  HPI   Past Medical History:  Diagnosis Date   Arthritis    rheumatoid   Diabetes mellitus without complication ([place])    Diverticulosis    Environmental and seasonal allergies    GERD (gastroesophageal reflux disease)    Hyperlipidemia    Osteopenia    Sinus congestion    Sleep apnea    uses CPAP   Stroke ([place])    TIA 6 years ago no issues since   TIA (transient ischemic attack)    Past Surgical History:  Procedure Laterality Date   BREAST BIOPSY Right 2010   benign   BREAST CYST ASPIRATION Right    BREAST SURGERY Right 2010   biopsy   CATARACT EXTRACTION W/PHACO Right 11/11/2016   Procedure: CATARACT EXTRACTION PHACO AND INTRAOCULAR LENS PLACEMENT ([place]);  Surgeon: Eulogio Bear, MD;  Location: [facility];  Service: Ophthalmology;  Laterality: Right;  [place] 1:04.9 AP% 14.6 CDE 9.64 Fluid pack lot # [id number]   CATARACT EXTRACTION W/PHACO Left 09/26/2019   Procedure: CATARACT EXTRACTION PHACO AND INTRAOCULAR LENS PLACEMENT ([facility]) LEFT DIABETIC SYMFONY LENS 12.82  01:22.2 15.6%;  Surgeon: Leandrew Koyanagi, MD;  Location: [place];  Service: Ophthalmology;  Laterality: Left;   ESOPHAGOGASTRODUODENOSCOPY (EGD) WITH PROPOFOL N/A 12/29/2015   Procedure: ESOPHAGOGASTRODUODENOSCOPY (EGD) WITH PROPOFOL;  Surgeon: Lucilla Lame, MD;  Location: [place];  Service: Endoscopy;  Laterality: N/A;  GASTRI BIOPSY   TUBAL LIGATION     Social History   Socioeconomic History   Marital status: Widowed    Spouse name: Not on file   Number of children: 2   Years of education: College   Highest education level: Bachelor's degree (e.g., BA, AB, BS)  Occupational History   Occupation: [employer]    Comment: Full Time  Tobacco Use   Smoking status: Never   Smokeless tobacco: Never  Vaping Use   Vaping Use: Never used  Substance and Sexual Activity   Alcohol use: No   Drug use: No   Sexual activity: Not on file  Other Topics Concern   Not on file  Social History Narrative   2 biological children and 1 step child   Social Determinants of Health   Financial Resource Strain: Low Risk  (11/02/2021)   Overall Financial Resource Strain (CARDIA)    Difficulty of Paying Living Expenses: Not hard at all  Food Insecurity: No Food Insecurity (09/10/2021)   Hunger Vital Sign    Worried About Running Out of Food in the Last Year: Never true  Ran Out of Food in the Last Year: Never true  Transportation Needs: No Transportation Needs (09/10/2021)   PRAPARE - Hydrologist (Medical): No    Lack of Transportation (Non-Medical): No  Physical Activity: Sufficiently Active (09/10/2021)   Exercise Vital Sign    Days of Exercise per Week: 4 days    Minutes of Exercise per Session: 60 min  Stress: Stress Concern Present (12/19/2019)   [place]    Feeling of Stress : To some extent  Social Connections: Moderately Isolated (09/10/2021)    Social Connection and Isolation Panel [NHANES]    Frequency of Communication with Friends and Family: More than three times a week    Frequency of Social Gatherings with Friends and Family: More than three times a week    Attends Religious Services: More than 4 times per year    Active Member of [employer] or Organizations: No    Attends Archivist Meetings: Never    Marital Status: Widowed  Intimate Partner Violence: Not At Risk (09/10/2021)   Humiliation, Afraid, Rape, and Kick questionnaire    Fear of Current or Ex-Partner: No    Emotionally Abused: No    Physically Abused: No    Sexually Abused: No   Family Status  Relation Name Status   Mother  Deceased   Father  Deceased   Sister  Alive   Brother  Deceased at age 44       MVA   Brother  Alive   Brother  Alive   Neg Hx  (Not Specified)   Family History  Problem Relation Age of Onset   Congestive Heart Failure Mother    Heart attack Mother    Dementia Father    Heart attack Father    Prostate cancer Father    Atrial fibrillation Sister    Hyperlipidemia Sister    Heart disease Brother    Breast cancer Neg Hx    Allergies  Allergen Reactions   Antihistamines, Chlorpheniramine-Type     Facial swelling    Macrobid [Nitrofurantoin Macrocrystal]     unknown   Statins     Muscle Pain, Weakness     Patient Care Team: Gwyneth Sprout, FNP as PCP - General (Family Medicine) Leandrew Koyanagi, MD as Referring Physician (Ophthalmology) Lucky Cowboy Erskine Squibb, MD as Referring Physician (Vascular Surgery) Germaine Pomfret, [facility] (Pharmacist)   Medications: Outpatient Medications Prior to Visit  Medication Sig   Berberine Chloride 500 MG CAPS    blood glucose meter kit and supplies Dispense based on patient and insurance preference. To check sugar once daily.   Cholecalciferol (VITAMIN D) 2000 UNITS CAPS Take 4,000 Units by mouth daily.   Cyanocobalamin (VITAMIN B-12) 1000 MCG SUBL Place 1 tablet (1,000 mcg total) under  the tongue daily.   estradiol (ESTRACE) 0.1 MG/GM vaginal cream Place 1 Applicatorful vaginally at bedtime.   Metformin HCl 500 MG/5ML SOLN    Omega-3 Fatty Acids (FISH OIL) 1000 MG CAPS Take 1,000-2,000 mg by mouth at bedtime.    [DISCONTINUED] meloxicam (MOBIC) 15 MG tablet TAKE 1 TABLET BY MOUTH DAILY   No facility-administered medications prior to visit.    Review of Systems  HENT:  Positive for tinnitus.   Respiratory:  Positive for apnea.   Musculoskeletal:  Positive for back pain.    Objective    BP 123/67 (BP Location: Right Arm, Patient Position: Sitting, Cuff Size: Normal)  Pulse 67   Temp 98.3 F (36.8 C) (Oral)   Resp 16   Ht _0  (1.676 m)   Wt 152 lb (68.9 kg)   SpO2 98%   BMI 24.53 kg/m   Physical Exam Vitals and nursing note reviewed.  Constitutional:      General: She is awake. She is not in acute distress.    Appearance: Normal appearance. She is well-developed, well-groomed and normal weight. She is not ill-appearing, toxic-appearing or diaphoretic.  HENT:     Head: Normocephalic and atraumatic.     Jaw: There is normal jaw occlusion. No trismus, tenderness, swelling or pain on movement.     Right Ear: Tympanic membrane, ear canal and external ear normal. Decreased hearing noted. There is no impacted cerumen.     Left Ear: Tympanic membrane, ear canal and external ear normal. Decreased hearing noted. There is no impacted cerumen.     Ears:     Comments: Has hearing aids; wears when watching TV     Nose: Nose normal. No congestion or rhinorrhea.     Right Turbinates: Not enlarged, swollen or pale.     Left Turbinates: Not enlarged, swollen or pale.     Right Sinus: No maxillary sinus tenderness or frontal sinus tenderness.     Left Sinus: No maxillary sinus tenderness or frontal sinus tenderness.     Mouth/Throat:     Lips: Pink.     Mouth: Mucous membranes are moist. No injury.     Tongue: No lesions.     Pharynx: Oropharynx is clear. Uvula  midline. No pharyngeal swelling, oropharyngeal exudate, posterior oropharyngeal erythema or uvula swelling.     Tonsils: No tonsillar exudate or tonsillar abscesses.  Eyes:     General: Lids are normal. Lids are everted, no foreign bodies appreciated. Vision grossly intact. Gaze aligned appropriately. No allergic shiner or visual field deficit.       Right eye: No discharge.        Left eye: No discharge.     Extraocular Movements: Extraocular movements intact.     Conjunctiva/sclera: Conjunctivae normal.     Right eye: Right conjunctiva is not injected. No exudate.    Left eye: Left conjunctiva is not injected. No exudate.    Pupils: Pupils are equal, round, and reactive to light.  Neck:     Thyroid: No thyroid mass, thyromegaly or thyroid tenderness.     Vascular: No carotid bruit.     Trachea: Trachea normal.  Cardiovascular:     Rate and Rhythm: Normal rate and regular rhythm.     Pulses: Normal pulses.          Carotid pulses are 2+ on the right side and 2+ on the left side.      Radial pulses are 2+ on the right side and 2+ on the left side.       Dorsalis pedis pulses are 2+ on the right side and 2+ on the left side.       Posterior tibial pulses are 2+ on the right side and 2+ on the left side.     Heart sounds: Normal heart sounds, S1 normal and S2 normal. No murmur heard.    No friction rub. No gallop.  Pulmonary:     Effort: Pulmonary effort is normal. No respiratory distress.     Breath sounds: Normal breath sounds and air entry. No stridor. No wheezing, rhonchi or rales.  Chest:     Chest wall: No  tenderness.  Abdominal:     General: Abdomen is flat. Bowel sounds are normal. There is no distension.     Palpations: Abdomen is soft. There is no mass.     Tenderness: There is no abdominal tenderness. There is no right CVA tenderness, left CVA tenderness, guarding or rebound.     Hernia: No hernia is present.  Genitourinary:    Comments: Exam deferred; denies  complaints Request for referral to local GYN for pessary removal and cleaning- due in 1/24 Musculoskeletal:        General: No swelling, tenderness, deformity or signs of injury. Normal range of motion.     Cervical back: Full passive range of motion without pain, normal range of motion and neck supple. No edema, rigidity or tenderness. No muscular tenderness.     Right lower leg: No edema.     Left lower leg: No edema.  Lymphadenopathy:     Cervical: No cervical adenopathy.     Right cervical: No superficial, deep or posterior cervical adenopathy.    Left cervical: No superficial, deep or posterior cervical adenopathy.  Skin:    General: Skin is warm and dry.     Capillary Refill: Capillary refill takes less than 2 seconds.     Coloration: Skin is not jaundiced or pale.     Findings: No bruising, erythema, lesion or rash.  Neurological:     General: No focal deficit present.     Mental Status: She is alert and oriented to person, place, and time. Mental status is at baseline.     GCS: GCS eye subscore is 4. GCS verbal subscore is 5. GCS motor subscore is 6.     Sensory: Sensation is intact. No sensory deficit.     Motor: Motor function is intact. No weakness.     Coordination: Coordination is intact. Coordination normal.     Gait: Gait is intact. Gait normal.  Psychiatric:        Attention and Perception: Attention and perception normal.        Mood and Affect: Mood and affect normal.        Speech: Speech normal.        Behavior: Behavior normal. Behavior is cooperative.        Thought Content: Thought content normal.        Cognition and Memory: Cognition and memory normal.        Judgment: Judgment normal.     Last depression screening scores    06/16/2022    3:01 PM 03/16/2022   10:43 AM 02/12/2022   10:36 AM  PHQ 2/9 Scores  PHQ - 2 Score 0 0 6  PHQ- 9 Score 0 0 12   Last fall risk screening    06/16/2022    3:01 PM  [place] in the past year? 0  Number  falls in past yr: 0  Injury with Fall? 0  Risk for fall due to : No Fall Risks  Follow up Falls evaluation completed   Last Audit-C alcohol use screening    06/16/2022    3:01 PM  Alcohol Use Disorder Test (AUDIT)  1. How often do you have a drink containing alcohol? 0  2. How many drinks containing alcohol do you have on a typical day when you are drinking? 0  3. How often do you have six or more drinks on one occasion? 0  AUDIT-C Score 0   A score of 3 or more in  women, and 4 or more in men indicates increased risk for alcohol abuse, EXCEPT if all of the points are from question 1   No results found for any visits on 06/16/22.  Assessment & Plan    Routine Health Maintenance and Physical Exam  Exercise Activities and Dietary recommendations  Goals      DIET - EAT MORE FRUITS AND VEGETABLES     DIET - INCREASE WATER INTAKE     Recommend increasing water intake to 4-6 glasses a day.      Monitor and Manage My Blood Sugar-Diabetes Type 2     Timeframe:  Long-Range Goal Priority:  High Start Date: 02/13/2021                             Expected End Date: 08/15/2022                      Follow Up within 90 days   - check blood sugar at prescribed times - check blood sugar if I feel it is too high or too low - enter blood sugar readings and medication or insulin into daily log    Why is this important?   Checking your blood sugar at home helps to keep it from getting very high or very low.  Writing the results in a diary or log helps the doctor know how to care for you.  Your blood sugar log should have the time, date and the results.  Also, write down the amount of insulin or other medicine that you take.  Other information, like what you ate, exercise done and how you were feeling, will also be helpful.     Notes:         Immunization History  Administered Date(s) Administered   Pneumococcal Conjugate-13 08/06/2015   Td 12/22/2007   Tdap 12/22/2007    Health  Maintenance  Topic Date Due   FOOT EXAM  10/03/2020   Diabetic kidney evaluation - GFR measurement  01/20/2022   COVID-19 Vaccine (1) 07/02/2022 (Originally 03/05/1946)   Zoster Vaccines- Shingrix (1 of 2) 09/16/2022 (Originally 09/06/1995)   INFLUENZA VACCINE  11/28/2022 (Originally 03/30/2022)   Pneumonia Vaccine 42+ Years old (2 - PPSV23 or PCV20) 06/17/2023 (Originally 08/05/2016)   TETANUS/TDAP  06/17/2023 (Originally 12/21/2017)   HEMOGLOBIN A1C  09/16/2022   MAMMOGRAM  09/21/2022   Diabetic kidney evaluation - Urine ACR  02/13/2023   OPHTHALMOLOGY EXAM  05/08/2023   DEXA SCAN  05/02/2024   Hepatitis C Screening  Completed   HPV VACCINES  Aged Out   COLONOSCOPY (Pts 45-87yr Insurance coverage will need to be confirmed)  Discontinued    Discussed health benefits of physical activity, and encouraged her to engage in regular exercise appropriate for her age and condition.  Problem List Items Addressed This Visit       Other   Annual physical exam - Primary    Declines referral to GI for repeat colon cancer screening Noted going off of metformin for a bit; however, FBG elevated to 180s-200 and has resumed x1/day Things to do to keep yourself healthy  - Exercise at least 30-45 minutes a day, 3-4 days a week.  - Eat a low-fat diet with lots of fruits and vegetables, up to 7-9 servings per day.  - Seatbelts can save your life. Wear them always.  - Smoke detectors on every level of your home, check batteries every year.  -  Eye Doctor - have an eye exam every 1-2 years  - Safe sex - if you may be exposed to STDs, use a condom.  - Alcohol -  If you drink, do it moderately, less than 2 drinks per day.  - [place]. Choose someone to speak for you if you are not able.  - Depression is common in our stressful world.If you're feeling down or losing interest in things you normally enjoy, please come in for a visit.  - Violence - If anyone is threatening or hurting you,  please call immediately.        Relevant Orders   Comprehensive Metabolic Panel (CMET)   CBC with Differential/Platelet   Lipid panel   Hemoglobin A1c   Vaginal pessary in situ    Request for referral to local provided for 08/2022 cleaning/removal Denies complaints/concerns at this time       Relevant Orders   Ambulatory referral to Gynecology   Return in about 6 months (around 12/16/2022) for chonic disease management.    Vonna Kotyk, FNP, have reviewed all documentation for this visit. The documentation on 06/16/22 for the exam, diagnosis, procedures, and orders are all accurate and complete.  Gwyneth Sprout, [place] [phone] (phone) [phone] (fax)  [place]

## 2022-06-17 ENCOUNTER — Other Ambulatory Visit: Payer: Self-pay | Admitting: Family Medicine

## 2022-06-17 LAB — LIPID PANEL
Chol/HDL Ratio: 6.3 ratio — ABNORMAL HIGH (ref 0.0–4.4)
Cholesterol, Total: 347 mg/dL — ABNORMAL HIGH (ref 100–199)
HDL: 55 mg/dL (ref >39)
LDL Chol Calc (NIH): 225 mg/dL — ABNORMAL HIGH (ref 0–99)
Triglycerides: 322 mg/dL — ABNORMAL HIGH (ref 0–149)
VLDL Cholesterol Cal: 67 mg/dL — ABNORMAL HIGH (ref 5–40)

## 2022-06-17 LAB — CBC WITH DIFFERENTIAL/PLATELET
Basophils Absolute: 0 10*3/uL (ref 0.0–0.2)
Basos: 1 %
EOS (ABSOLUTE): 0.1 10*3/uL (ref 0.0–0.4)
Eos: 1 %
Hematocrit: 44.2 % (ref 34.0–46.6)
Hemoglobin: 14.6 g/dL (ref 11.1–15.9)
Immature Grans (Abs): 0 10*3/uL (ref 0.0–0.1)
Immature Granulocytes: 0 %
Lymphocytes Absolute: 1.1 10*3/uL (ref 0.7–3.1)
Lymphs: 15 %
MCH: 31.9 pg (ref 26.6–33.0)
MCHC: 33 g/dL (ref 31.5–35.7)
MCV: 97 fL (ref 79–97)
Monocytes Absolute: 0.5 10*3/uL (ref 0.1–0.9)
Monocytes: 6 %
Neutrophils Absolute: 6.1 10*3/uL (ref 1.4–7.0)
Neutrophils: 77 %
Platelets: 279 10*3/uL (ref 150–450)
RBC: 4.58 x10E6/uL (ref 3.77–5.28)
RDW: 12.4 % (ref 11.7–15.4)
WBC: 7.8 10*3/uL (ref 3.4–10.8)

## 2022-06-17 LAB — COMPREHENSIVE METABOLIC PANEL
ALT: 21 IU/L (ref 0–32)
AST: 17 IU/L (ref 0–40)
Albumin/Globulin Ratio: 2.3 — ABNORMAL HIGH (ref 1.2–2.2)
Albumin: 4.8 g/dL (ref 3.8–4.8)
Alkaline Phosphatase: 87 IU/L (ref 44–121)
BUN/Creatinine Ratio: 30 — ABNORMAL HIGH (ref 12–28)
BUN: 17 mg/dL (ref 8–27)
Bilirubin Total: 0.4 mg/dL (ref 0.0–1.2)
CO2: 26 mmol/L (ref 20–29)
Calcium: 10.2 mg/dL (ref 8.7–10.3)
Chloride: 100 mmol/L (ref 96–106)
Creatinine, Ser: 0.57 mg/dL (ref 0.57–1.00)
Globulin, Total: 2.1 g/dL (ref 1.5–4.5)
Glucose: 120 mg/dL — ABNORMAL HIGH (ref 70–99)
Potassium: 4.2 mmol/L (ref 3.5–5.2)
Sodium: 141 mmol/L (ref 134–144)
Total Protein: 6.9 g/dL (ref 6.0–8.5)
eGFR: 94 mL/min/{1.73_m2} (ref >59)

## 2022-06-17 LAB — HEMOGLOBIN A1C
Est. average glucose Bld gHb Est-mCnc: 180 mg/dL
Hgb A1c MFr Bld: 7.9 % — ABNORMAL HIGH (ref 4.8–5.6)

## 2022-06-17 NOTE — Progress Notes (Signed)
A1c has increased as we expected; now 7.9%. Continue metformin once/day to assist in addition to continued recommendation of balanced, lower carb meals. Smaller meal size, adding snacks. Choosing water as drink of choice and increasing purposeful exercise.  Continue to recommend additional agent, like Repatha, to assist with cholesterol reduction and addition of fenofibrate to assist with fat/lipid reduction. Referral can also be placed to lipid specialist for additional discussion.  Take care, Gwyneth Sprout, [address] [place], [place] [address] [phone] (phone) [phone] (fax) [place]

## 2022-06-28 ENCOUNTER — Encounter (INDEPENDENT_AMBULATORY_CARE_PROVIDER_SITE_OTHER): Payer: Self-pay

## 2022-07-15 ENCOUNTER — Encounter: Payer: Self-pay | Admitting: Family Medicine

## 2022-07-15 NOTE — Telephone Encounter (Signed)
Please advise? There is no referral in epic to cardiology.

## 2022-07-27 ENCOUNTER — Other Ambulatory Visit: Payer: Self-pay | Admitting: Family Medicine

## 2022-07-27 DIAGNOSIS — E119 Type 2 diabetes mellitus without complications: Secondary | ICD-10-CM

## 2022-08-13 ENCOUNTER — Other Ambulatory Visit: Payer: Self-pay | Admitting: Family Medicine

## 2022-08-30 DIAGNOSIS — M5430 Sciatica, unspecified side: Secondary | ICD-10-CM

## 2022-08-30 HISTORY — DX: Sciatica, unspecified side: M54.30

## 2022-08-31 ENCOUNTER — Encounter: Payer: Self-pay | Admitting: Obstetrics and Gynecology

## 2022-08-31 ENCOUNTER — Ambulatory Visit: Payer: Medicare HMO | Admitting: Obstetrics and Gynecology

## 2022-08-31 VITALS — BP 114/76 | HR 72 | Resp 16 | Ht 66.0 in | Wt 153.3 lb

## 2022-08-31 DIAGNOSIS — Z7689 Persons encountering health services in other specified circumstances: Secondary | ICD-10-CM

## 2022-08-31 DIAGNOSIS — N819 Female genital prolapse, unspecified: Secondary | ICD-10-CM

## 2022-08-31 DIAGNOSIS — Z4689 Encounter for fitting and adjustment of other specified devices: Secondary | ICD-10-CM

## 2022-08-31 DIAGNOSIS — R32 Unspecified urinary incontinence: Secondary | ICD-10-CM

## 2022-08-31 NOTE — Progress Notes (Signed)
GYNECOLOGY PROGRESS NOTE  Subjective:    Patient ID: Christine Cage., female    DOB: Apr 13, 1946, 77 y.o.   MRN: [id number]  HPI  Patient is a 77 y.o. 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 She has a Size 2 3/4 Gelhorn pessary in place, has utilized for ~ 1 year  Previously was having pessary surveillance performed at [facility], however notes that it was becoming too far of a distance to drive and desired an office closer to home (currently lives in [place], [place]). Reports that Uses estrogen cream for pessary maintenance twice weekly.  Denies any issues moving her bowels or urinating.   Has also seen a physical therapist in the past to help with leaking which helped.  However notes symptoms are mildly returning.  May need to return there. Does exercise several days a week which may be exacerbating her mild symptoms.    Past Medical History:  Diagnosis Date   Arthritis    rheumatoid   Diabetes mellitus without complication ([facility])    Diverticulosis    Environmental and seasonal allergies    GERD (gastroesophageal reflux disease)    Hyperlipidemia    Osteopenia    Sinus congestion    Sleep apnea    uses CPAP   Stroke ([place])    TIA 6 years ago no issues since   TIA (transient ischemic attack)     Family History  Problem Relation Age of Onset   Congestive Heart Failure Mother    Heart attack Mother    Dementia Father    Heart attack Father    Prostate cancer Father    Atrial fibrillation Sister    Hyperlipidemia Sister    Heart disease Brother    Breast cancer Neg Hx     Past Surgical History:  Procedure Laterality Date   BREAST BIOPSY Right 2010   benign   BREAST CYST ASPIRATION Right    BREAST SURGERY Right 2010   biopsy   CATARACT EXTRACTION W/PHACO Right 11/11/2016   Procedure: CATARACT EXTRACTION PHACO AND INTRAOCULAR LENS PLACEMENT ([place]);  Surgeon: Eulogio Bear, MD;   Location: [facility];  Service: Ophthalmology;  Laterality: Right;  [place] 1:04.9 AP% 14.6 CDE 9.64 Fluid pack lot # [id number]   CATARACT EXTRACTION W/PHACO Left 09/26/2019   Procedure: CATARACT EXTRACTION PHACO AND INTRAOCULAR LENS PLACEMENT (IOC) LEFT DIABETIC SYMFONY LENS 12.82 01:22.2 15.6%;  Surgeon: Leandrew Koyanagi, MD;  Location: [place];  Service: Ophthalmology;  Laterality: Left;   ESOPHAGOGASTRODUODENOSCOPY (EGD) WITH PROPOFOL N/A 12/29/2015   Procedure: ESOPHAGOGASTRODUODENOSCOPY (EGD) WITH PROPOFOL;  Surgeon: Lucilla Lame, MD;  Location: [place];  Service: Endoscopy;  Laterality: N/A;  GASTRI BIOPSY   TUBAL LIGATION      Social History   Socioeconomic History   Marital status: Widowed    Spouse name: Not on file   Number of children: 2   Years of education: College   Highest education level: Bachelor's degree (e.g., BA, AB, BS)  Occupational History   Occupation: [employer]    Comment: Full Time  Tobacco Use   Smoking status: Never   Smokeless tobacco: Never  Vaping Use   Vaping Use: Never used  Substance and Sexual Activity   Alcohol use: No   Drug use: No   Sexual activity: Not on file  Other Topics Concern   Not on file  Social History Narrative  2 biological children and 1 step child   Social Determinants of Health   Financial Resource Strain: Low Risk  (11/02/2021)   Overall Financial Resource Strain (CARDIA)    Difficulty of Paying Living Expenses: Not hard at all  Food Insecurity: No Food Insecurity (09/10/2021)   Hunger Vital Sign    Worried About Running Out of Food in the Last Year: Never true    Ran Out of Food in the Last Year: Never true  Transportation Needs: No Transportation Needs (09/10/2021)   PRAPARE - Hydrologist (Medical): No    Lack of Transportation (Non-Medical): No  Physical Activity: Sufficiently Active (09/10/2021)   Exercise Vital Sign    Days of Exercise per  Week: 4 days    Minutes of Exercise per Session: 60 min  Stress: Stress Concern Present (12/19/2019)   [place]    Feeling of Stress : To some extent  Social Connections: Moderately Isolated (09/10/2021)   Social Connection and Isolation Panel [NHANES]    Frequency of Communication with Friends and Family: More than three times a week    Frequency of Social Gatherings with Friends and Family: More than three times a week    Attends Religious Services: More than 4 times per year    Active Member of [employer] or Organizations: No    Attends Archivist Meetings: Never    Marital Status: Widowed  Intimate Partner Violence: Not At Risk (09/10/2021)   Humiliation, Afraid, Rape, and Kick questionnaire    Fear of Current or Ex-Partner: No    Emotionally Abused: No    Physically Abused: No    Sexually Abused: No    Current Outpatient Medications on File Prior to Visit  Medication Sig Dispense Refill   Berberine Chloride 500 MG CAPS      blood glucose meter kit and supplies Dispense based on patient and insurance preference. To check sugar once daily. 1 each 0   Cholecalciferol (VITAMIN D) 2000 UNITS CAPS Take 4,000 Units by mouth daily.     Cyanocobalamin (VITAMIN B-12) 1000 MCG SUBL Place 1 tablet (1,000 mcg total) under the tongue daily.  0   estradiol (ESTRACE) 0.1 MG/GM vaginal cream Place 1 Applicatorful vaginally at bedtime.     Metformin HCl 500 MG/5ML SOLN      Omega-3 Fatty Acids (FISH OIL) 1000 MG CAPS Take 1,000-2,000 mg by mouth at bedtime.      TRUE METRIX BLOOD GLUCOSE TEST test strip TEST BLOOD SUGAR ONE TIME DAILY 100 strip 3   No current facility-administered medications on file prior to visit.    Allergies  Allergen Reactions   Antihistamines, Chlorpheniramine-Type     Facial swelling    Macrobid [Nitrofurantoin Macrocrystal]     unknown   Statins     Muscle Pain, Weakness       Review of  Systems Pertinent items noted in HPI and remainder of comprehensive ROS otherwise negative.   Objective:   Blood pressure 114/76, pulse 72, resp. rate 16, height _0  (1.676 m), weight 153 lb 4.8 oz (69.5 kg).  Body mass index is 24.74 kg/m. General appearance: alert and no distress Abdomen: soft, non-tender; bowel sounds normal; no masses,  no organomegaly Pelvic: external genitalia normal.  Size 2 3/4 Gellhorn pessary removed cleaned and replaced without difficulty.  Speculum exam reveals normal vaginal mucosa, no lesions or abrasions, Grade 2 vaginal vault prolapse present.   Extremities:  extremities normal, atraumatic, no cyanosis or edema Neurologic: Grossly normal   Assessment:   1. Encounter to establish care   2. Pessary maintenance   3. Incontinence in female   4. Vaginal vault prolapse      Plan:   Continue pessary checks q 3-4 months.  Continue use of Estrace for pessary maintenance 1-2 times weekly.  Advised that if mild symptoms return, can re-establish with physical therapist for supplemental treatment.

## 2022-09-03 DIAGNOSIS — G4733 Obstructive sleep apnea (adult) (pediatric): Secondary | ICD-10-CM | POA: Diagnosis not present

## 2022-09-13 ENCOUNTER — Ambulatory Visit (INDEPENDENT_AMBULATORY_CARE_PROVIDER_SITE_OTHER): Payer: Medicare HMO

## 2022-09-13 VITALS — BP 122/70 | Ht 66.0 in | Wt 153.5 lb

## 2022-09-13 DIAGNOSIS — Z0000 Encounter for general adult medical examination without abnormal findings: Secondary | ICD-10-CM | POA: Diagnosis not present

## 2022-09-13 NOTE — Patient Instructions (Signed)
Christine Richards , Thank you for taking time to come for your Medicare Wellness Visit. I appreciate your ongoing commitment to your health goals. Please review the following plan we discussed and let me know if I can assist you in the future.   These are the goals we discussed:  Goals      DIET - EAT MORE FRUITS AND VEGETABLES     DIET - INCREASE WATER INTAKE     Recommend increasing water intake to 4-6 glasses a day.      Monitor and Manage My Blood Sugar-Diabetes Type 2     Timeframe:  Long-Range Goal Priority:  High Start Date: 02/13/2021                             Expected End Date: 08/15/2022                      Follow Up within 90 days   - check blood sugar at prescribed times - check blood sugar if I feel it is too high or too low - enter blood sugar readings and medication or insulin into daily log    Why is this important?   Checking your blood sugar at home helps to keep it from getting very high or very low.  Writing the results in a diary or log helps the doctor know how to care for you.  Your blood sugar log should have the time, date and the results.  Also, write down the amount of insulin or other medicine that you take.  Other information, like what you ate, exercise done and how you were feeling, will also be helpful.     Notes:         This is a list of the screening recommended for you and due dates:  Health Maintenance  Topic Date Due   COVID-19 Vaccine (1) Never done   DTaP/Tdap/Td vaccine (3 - Td or Tdap) 12/21/2017   Complete foot exam   10/03/2020   Zoster (Shingles) Vaccine (1 of 2) 09/16/2022*   Flu Shot  11/28/2022*   Pneumonia Vaccine (2 - PPSV23 or PCV20) 06/17/2023*   Mammogram  09/21/2022   Hemoglobin A1C  12/16/2022   Yearly kidney health urinalysis for diabetes  02/13/2023   Eye exam for diabetics  05/08/2023   Yearly kidney function blood test for diabetes  06/17/2023   Medicare Annual Wellness Visit  09/14/2023   DEXA scan (bone density  measurement)  05/02/2024   Hepatitis C Screening: USPSTF Recommendation to screen - Ages 18-79 yo.  Completed   HPV Vaccine  Aged Out   Colon Cancer Screening  Discontinued  *Topic was postponed. The date shown is not the original due date.    Advanced directives: declined  Conditions/risks identified: none  Next appointment: Follow up in one year for your annual wellness visit 09/19/2023@ 3pm in person   Preventive Care 65 Years and Older, Female Preventive care refers to lifestyle choices and visits with your health care provider that can promote health and wellness. What does preventive care include? A yearly physical exam. This is also called an annual well check. Dental exams once or twice a year. Routine eye exams. Ask your health care provider how often you should have your eyes checked. Personal lifestyle choices, including: Daily care of your teeth and gums. Regular physical activity. Eating a healthy diet. Avoiding tobacco and drug use. Limiting alcohol use.  Practicing safe sex. Taking low-dose aspirin every day. Taking vitamin and mineral supplements as recommended by your health care provider. What happens during an annual well check? The services and screenings done by your health care provider during your annual well check will depend on your age, overall health, lifestyle risk factors, and family history of disease. Counseling  Your health care provider may ask you questions about your: Alcohol use. Tobacco use. Drug use. Emotional well-being. Home and relationship well-being. Sexual activity. Eating habits. History of falls. Memory and ability to understand (cognition). Work and work Statistician. Reproductive health. Screening  You may have the following tests or measurements: Height, weight, and BMI. Blood pressure. Lipid and cholesterol levels. These may be checked every 5 years, or more frequently if you are over 2 years old. Skin check. Lung  cancer screening. You may have this screening every year starting at age 78 if you have a 30-pack-year history of smoking and currently smoke or have quit within the past 15 years. Fecal occult blood test (FOBT) of the stool. You may have this test every year starting at age 52. Flexible sigmoidoscopy or colonoscopy. You may have a sigmoidoscopy every 5 years or a colonoscopy every 10 years starting at age 43. Hepatitis C blood test. Hepatitis B blood test. Sexually transmitted disease (STD) testing. Diabetes screening. This is done by checking your blood sugar (glucose) after you have not eaten for a while (fasting). You may have this done every 1-3 years. Bone density scan. This is done to screen for osteoporosis. You may have this done starting at age 34. Mammogram. This may be done every 1-2 years. Talk to your health care provider about how often you should have regular mammograms. Talk with your health care provider about your test results, treatment options, and if necessary, the need for more tests. Vaccines  Your health care provider may recommend certain vaccines, such as: Influenza vaccine. This is recommended every year. Tetanus, diphtheria, and acellular pertussis (Tdap, Td) vaccine. You may need a Td booster every 10 years. Zoster vaccine. You may need this after age 48. Pneumococcal 13-valent conjugate (PCV13) vaccine. One dose is recommended after age 63. Pneumococcal polysaccharide (PPSV23) vaccine. One dose is recommended after age 58. Talk to your health care provider about which screenings and vaccines you need and how often you need them. This information is not intended to replace advice given to you by your health care provider. Make sure you discuss any questions you have with your health care provider. Document Released: 09/12/2015 Document Revised: 05/05/2016 Document Reviewed: 06/17/2015 Elsevier Interactive Patient Education  2017 [place] Prevention in  the Home Falls can cause injuries. They can happen to people of all ages. There are many things you can do to make your home safe and to help prevent falls. What can I do on the outside of my home? Regularly fix the edges of walkways and driveways and fix any cracks. Remove anything that might make you trip as you walk through a door, such as a raised step or threshold. Trim any bushes or trees on the path to your home. Use bright outdoor lighting. Clear any walking paths of anything that might make someone trip, such as rocks or tools. Regularly check to see if handrails are loose or broken. Make sure that both sides of any steps have handrails. Any raised decks and porches should have guardrails on the edges. Have any leaves, snow, or ice cleared regularly. Use sand or  salt on walking paths during winter. Clean up any spills in your garage right away. This includes oil or grease spills. What can I do in the bathroom? Use night lights. Install grab bars by the toilet and in the tub and shower. Do not use towel bars as grab bars. Use non-skid mats or decals in the tub or shower. If you need to sit down in the shower, use a plastic, non-slip stool. Keep the floor dry. Clean up any water that spills on the floor as soon as it happens. Remove soap buildup in the tub or shower regularly. Attach bath mats securely with double-sided non-slip rug tape. Do not have throw rugs and other things on the floor that can make you trip. What can I do in the bedroom? Use night lights. Make sure that you have a light by your bed that is easy to reach. Do not use any sheets or blankets that are too big for your bed. They should not hang down onto the floor. Have a firm chair that has side arms. You can use this for support while you get dressed. Do not have throw rugs and other things on the floor that can make you trip. What can I do in the kitchen? Clean up any spills right away. Avoid walking on wet  floors. Keep items that you use a lot in easy-to-reach places. If you need to reach something above you, use a strong step stool that has a grab bar. Keep electrical cords out of the way. Do not use floor polish or wax that makes floors slippery. If you must use wax, use non-skid floor wax. Do not have throw rugs and other things on the floor that can make you trip. What can I do with my stairs? Do not leave any items on the stairs. Make sure that there are handrails on both sides of the stairs and use them. Fix handrails that are broken or loose. Make sure that handrails are as long as the stairways. Check any carpeting to make sure that it is firmly attached to the stairs. Fix any carpet that is loose or worn. Avoid having throw rugs at the top or bottom of the stairs. If you do have throw rugs, attach them to the floor with carpet tape. Make sure that you have a light switch at the top of the stairs and the bottom of the stairs. If you do not have them, ask someone to add them for you. What else can I do to help prevent falls? Wear shoes that: Do not have high heels. Have rubber bottoms. Are comfortable and fit you well. Are closed at the toe. Do not wear sandals. If you use a stepladder: Make sure that it is fully opened. Do not climb a closed stepladder. Make sure that both sides of the stepladder are locked into place. Ask someone to hold it for you, if possible. Clearly mark and make sure that you can see: Any grab bars or handrails. First and last steps. Where the edge of each step is. Use tools that help you move around (mobility aids) if they are needed. These include: Canes. Walkers. Scooters. Crutches. Turn on the lights when you go into a dark area. Replace any light bulbs as soon as they burn out. Set up your furniture so you have a clear path. Avoid moving your furniture around. If any of your floors are uneven, fix them. If there are any pets around you, be aware of  where they are. Review your medicines with your doctor. Some medicines can make you feel dizzy. This can increase your chance of falling. Ask your doctor what other things that you can do to help prevent falls. This information is not intended to replace advice given to you by your health care provider. Make sure you discuss any questions you have with your health care provider. Document Released: 06/12/2009 Document Revised: 01/22/2016 Document Reviewed: 09/20/2014 Elsevier Interactive Patient Education  2017 [employer].

## 2022-09-13 NOTE — Progress Notes (Signed)
Subjective:   Reeya Bound is a 77 y.o. female who presents for Medicare Annual (Subsequent) preventive examination.  Review of Systems     Cardiac Risk Factors include: advanced age (>4mn, >>37women);diabetes mellitus     Objective:    Today's Vitals   09/13/22 1542 09/13/22 1544  BP: 122/70   Weight: 153 lb 8 oz (69.6 kg)   Height: '5\' 6"'$  (1.676 m)   PainSc:  3    Body mass index is 24.78 kg/m.     09/13/2022    4:01 PM 09/10/2021    1:49 PM 12/19/2019    8:45 AM 09/26/2019    9:22 AM 12/18/2018    9:30 AM 11/04/2018   10:44 AM 12/15/2017    2:37 PM  Advanced Directives  Does Patient Have a Medical Advance Directive? No No No No Yes No No  Type of Advance Directive     [place]?     No - copy requested    Would patient like information on creating a medical advance directive? No - Patient declined No - Patient declined No - Patient declined No - Patient declined  No - Patient declined No - Patient declined    Current Medications (verified) Outpatient Encounter Medications as of 09/13/2022  Medication Sig   Berberine Chloride 500 MG CAPS    blood glucose meter kit and supplies Dispense based on patient and insurance preference. To check sugar once daily.   Cholecalciferol (VITAMIN D) 2000 UNITS CAPS Take 4,000 Units by mouth daily.   Cyanocobalamin (VITAMIN B-12) 1000 MCG SUBL Place 1 tablet (1,000 mcg total) under the tongue daily.   estradiol (ESTRACE) 0.1 MG/GM vaginal cream Place 1 Applicatorful vaginally at bedtime.   Omega-3 Fatty Acids (FISH OIL) 1000 MG CAPS Take 1,000-2,000 mg by mouth at bedtime.    TRUE METRIX BLOOD GLUCOSE TEST test strip TEST BLOOD SUGAR ONE TIME DAILY   Metformin HCl 500 MG/5ML SOLN  (Patient not taking: Reported on 09/13/2022)   No facility-administered encounter medications on file as of 09/13/2022.    Allergies (verified) Antihistamines, chlorpheniramine-type;  Macrobid [nitrofurantoin macrocrystal]; and Statins   History: Past Medical History:  Diagnosis Date   Arthritis    rheumatoid   Diabetes mellitus without complication ([place]    Diverticulosis    Environmental and seasonal allergies    GERD (gastroesophageal reflux disease)    Hyperlipidemia    Osteopenia    Sinus congestion    Sleep apnea    uses CPAP   [place]    TIA 6 years ago no issues since   TIA (transient ischemic attack)    Past Surgical History:  Procedure Laterality Date   BREAST BIOPSY Right 2010   benign   BREAST CYST ASPIRATION Right    BREAST SURGERY Right 2010   biopsy   CATARACT EXTRACTION W/PHACO Right 11/11/2016   Procedure: CATARACT EXTRACTION PHACO AND INTRAOCULAR LENS PLACEMENT ([place];  Surgeon: BEulogio Bear MD;  Location: [facility];  Service: Ophthalmology;  Laterality: Right;  [place]:04.9 AP% 14.6 CDE 9.64 Fluid pack lot # [id number]   CATARACT EXTRACTION W/PHACO Left 09/26/2019   Procedure: CATARACT EXTRACTION PHACO AND INTRAOCULAR LENS PLACEMENT (IOC) LEFT DIABETIC SYMFONY LENS 12.82 01:22.2 15.6%;  Surgeon: BLeandrew Koyanagi MD;  [place]  Service: Ophthalmology;  Laterality: Left;   ESOPHAGOGASTRODUODENOSCOPY (EGD) WITH PROPOFOL N/A 12/29/2015   Procedure: ESOPHAGOGASTRODUODENOSCOPY (EGD) WITH  PROPOFOL;  Surgeon: Lucilla Lame, MD;  Location: [place];  Service: Endoscopy;  Laterality: N/A;  GASTRI BIOPSY   TUBAL LIGATION     Family History  Problem Relation Age of Onset   Congestive Heart Failure Mother    Heart attack Mother    Dementia Father    Heart attack Father    Prostate cancer Father    Atrial fibrillation Sister    Hyperlipidemia Sister    Heart disease Brother    Breast cancer Neg Hx    Social History   Socioeconomic History   Marital status: Widowed    Spouse name: Not on file   Number of children: 2   Years of education: College   Highest education level: Bachelor's degree (e.g., BA,  AB, BS)  Occupational History   Occupation: [employer]    Comment: Full Time  Tobacco Use   Smoking status: Never   Smokeless tobacco: Never  Vaping Use   Vaping Use: Never used  Substance and Sexual Activity   Alcohol use: No   Drug use: No   Sexual activity: Not on file  Other Topics Concern   Not on file  Social History Narrative   2 biological children and 1 step child   Social Determinants of Health   Financial Resource Strain: Low Risk  (11/02/2021)   Overall Financial Resource Strain (CARDIA)    Difficulty of Paying Living Expenses: Not hard at all  Food Insecurity: No Food Insecurity (09/13/2022)   Hunger Vital Sign    Worried About Running Out of Food in the Last Year: Never true    Ran Out of Food in the Last Year: Never true  Transportation Needs: No Transportation Needs (09/13/2022)   PRAPARE - Hydrologist (Medical): No    Lack of Transportation (Non-Medical): No  Physical Activity: Sufficiently Active (09/10/2021)   Exercise Vital Sign    Days of Exercise per Week: 4 days    Minutes of Exercise per Session: 60 min  Stress: No Stress Concern Present (09/13/2022)   [place]    Feeling of Stress : Not at all  Social Connections: Moderately Isolated (09/13/2022)   Social Connection and Isolation Panel [NHANES]    Frequency of Communication with Friends and Family: More than three times a week    Frequency of Social Gatherings with Friends and Family: More than three times a week    Attends Religious Services: More than 4 times per year    Active Member of [employer] or Organizations: No    Attends Archivist Meetings: Never    Marital Status: Widowed    Tobacco Counseling Counseling given: Not Answered   Clinical Intake:  Pre-visit preparation completed: Yes  Pain : 0-10 Pain Score: 3  Pain Type: Chronic pain Pain Location: Hip Pain  Orientation: Right Pain Descriptors / Indicators: Jabbing Pain Onset: In the past 7 days Pain Frequency: Intermittent Pain Relieving Factors: exercises Effect of Pain on Daily Activities: none  Pain Relieving Factors: exercises  BMI - recorded: 24.78 Nutritional Status: BMI of 19-24  Normal Nutritional Risks: None Diabetes: Yes CBG done?: No Did pt. bring in CBG monitor from home?: No  How often do you need to have someone help you when you read instructions, pamphlets, or other written materials from your doctor or pharmacy?: 1 - Never  Diabetic?yes  Interpreter Needed?: No  Information entered by :: B.Mustafa Potts,LPN  Activities of Daily Living    09/13/2022    4:02 PM 06/16/2022    3:01 PM  In your present state of health, do you have any difficulty performing the following activities:  Hearing? 0 1  Vision? 0 0  Difficulty concentrating or making decisions? 0 0  Walking or climbing stairs? 0 0  Dressing or bathing? 0 0  Doing errands, shopping? 0 0  Preparing Food and eating ? N   Using the Toilet? N   In the past six months, have you accidently leaked urine? N   Comment had pessary re-place last week:encouraged to call Dr [email or web address]  Uro-GYN   Do you have problems with loss of bowel control? N   Managing your Medications? N   Managing your Finances? N   Housekeeping or managing your Housekeeping? N     Patient Care Team: Lottie Dawson, FNP as PCP - General (Family Medicine) Gwyneth Sprout, MD as Referring Physician (Ophthalmology) Leandrew Koyanagi Lucky Cowboy, MD as Referring Physician (Vascular Surgery) Erskine Squibb, [facility] (Pharmacist)  Indicate any recent Medical Services you may have received from other than [facility] providers in the past year (date may be approximate).     Assessment:   This is a routine wellness examination for Estefanny.  Hearing/Vision screen Hearing Screening - Comments:: Hearing aids in both ears Vision Screening - Comments:: Hx cataract  surgery both eyes:no problems with vision  Dietary issues and exercise activities discussed: Current Exercise Habits: Home exercise routine, Type of exercise: strength training/weights;stretching;treadmill;walking;calisthenics;exercise ball, Time (Minutes): > 60, Frequency (Times/Week): 5, Weekly Exercise (Minutes/Week): 0, Intensity: Intense, Exercise limited by: None identified   Goals Addressed             This Visit's Progress    DIET - EAT MORE FRUITS AND VEGETABLES   On track    DIET - INCREASE WATER INTAKE   Not on track    Recommend increasing water intake to 4-6 glasses a day.        Depression Screen    09/13/2022    3:56 PM 06/16/2022    3:01 PM 03/16/2022   10:43 AM 02/12/2022   10:36 AM 12/16/2021    4:03 PM 09/10/2021    1:47 PM 01/20/2021    1:27 PM  PHQ 2/9 Scores  PHQ - 2 Score 0 0 0 6 0 0 0  PHQ- 9 Score  0 0 12 0  0    Fall Risk    09/13/2022    3:52 PM 06/16/2022    3:01 PM 03/16/2022   10:43 AM 02/12/2022   10:36 AM 12/16/2021    4:03 PM  Fall Risk   Falls in the past year? 1 0 0 0 0  Comment tripped at gym jammed shoulder :did not seek medical attention      Number falls in past yr: 1 0 0 0 0  Injury with Fall? 0 0 0 0 0  Risk for fall due to : No Fall Risks No Fall Risks   No Fall Risks  Follow up Falls prevention discussed;Education provided Falls evaluation completed   Falls evaluation completed    [place]:  Any stairs in or around the home? Yes  If so, are there any without handrails? Yes  Home free of loose throw rugs in walkways, pet beds, electrical cords, etc? Yes  Adequate lighting in your home to reduce risk of falls? Yes   ASSISTIVE DEVICES UTILIZED TO PREVENT FALLS:  Life alert? Yes  Use of a cane, walker or w/c? No  Grab bars in the bathroom? No  Shower chair or bench in shower? Yes  Elevated toilet seat or a handicapped toilet? Yes   TIMED UP AND GO:  Was the test performed? Yes .  Length  of time to ambulate 10 feet: 10 sec.   Gait steady and fast without use of assistive device  Cognitive Function:        09/13/2022    4:06 PM 12/19/2019    8:51 AM 12/09/2016    9:51 AM  6CIT Screen  What Year? 0 points 0 points 0 points  What month? 0 points 0 points 0 points  What time? 0 points 0 points 0 points  Count back from 20 0 points 0 points 0 points  Months in reverse 0 points 0 points 0 points  Repeat phrase 0 points 0 points 0 points  Total Score 0 points 0 points 0 points    Immunizations Immunization History  Administered Date(s) Administered   Pneumococcal Conjugate-13 08/06/2015   Td 12/22/2007   Tdap 12/22/2007    TDAP status: Due, Education has been provided regarding the importance of this vaccine. Advised may receive this vaccine at local pharmacy or Health Dept. Aware to provide a copy of the vaccination record if obtained from local pharmacy or Health Dept. Verbalized acceptance and understanding.  Flu Vaccine status: Declined, Education has been provided regarding the importance of this vaccine but patient still declined. Advised may receive this vaccine at local pharmacy or Health Dept. Aware to provide a copy of the vaccination record if obtained from local pharmacy or Health Dept. Verbalized acceptance and understanding.  Pneumococcal vaccine status: Declined,  Education has been provided regarding the importance of this vaccine but patient still declined. Advised may receive this vaccine at local pharmacy or Health Dept. Aware to provide a copy of the vaccination record if obtained from local pharmacy or Health Dept. Verbalized acceptance and understanding.   Covid-19 vaccine status: Declined, Education has been provided regarding the importance of this vaccine but patient still declined. Advised may receive this vaccine at local pharmacy or Health Dept.or vaccine clinic. Aware to provide a copy of the vaccination record if obtained from local pharmacy or  Health Dept. Verbalized acceptance and understanding.  Qualifies for Shingles Vaccine? Yes   Zostavax completed No   Shingrix Completed?: No.    Education has been provided regarding the importance of this vaccine. Patient has been advised to call insurance company to determine out of pocket expense if they have not yet received this vaccine. Advised may also receive vaccine at local pharmacy or Health Dept. Verbalized acceptance and understanding.  Screening Tests Health Maintenance  Topic Date Due   COVID-19 Vaccine (1) Never done   DTaP/Tdap/Td (3 - Td or Tdap) 12/21/2017   FOOT EXAM  10/03/2020   Medicare Annual Wellness (AWV)  09/10/2022   Zoster Vaccines- Shingrix (1 of 2) 09/16/2022 (Originally 09/06/1995)   INFLUENZA VACCINE  11/28/2022 (Originally 03/30/2022)   Pneumonia Vaccine 21+ Years old (2 - PPSV23 or PCV20) 06/17/2023 (Originally 08/05/2016)   MAMMOGRAM  09/21/2022   HEMOGLOBIN A1C  12/16/2022   Diabetic kidney evaluation - Urine ACR  02/13/2023   OPHTHALMOLOGY EXAM  05/08/2023   Diabetic kidney evaluation - eGFR measurement  06/17/2023   DEXA SCAN  05/02/2024   Hepatitis C Screening  Completed   HPV VACCINES  Aged Out   COLONOSCOPY (Pts 45-11yr Insurance coverage  will need to be confirmed)  Discontinued    Health Maintenance  Health Maintenance Due  Topic Date Due   COVID-19 Vaccine (1) Never done   DTaP/Tdap/Td (3 - Td or Tdap) 12/21/2017   FOOT EXAM  10/03/2020   Medicare Annual Wellness (AWV)  09/10/2022    Colorectal cancer screening: No longer required.   Mammogram status: No longer required due to age.  Bone Density status: Completed 05/03/2019. Results reflect: Bone density results: NORMAL. Repeat every 5 years.  Lung Cancer Screening: (Low Dose CT Chest recommended if Age 69-80 years, 30 pack-year currently smoking OR have quit w/in 15years.) does not qualify.   Lung Cancer Screening Referral:   Additional Screening:  Hepatitis C Screening: does  qualify; Completed 12/20/2017  Vision Screening: Recommended annual ophthalmology exams for early detection of glaucoma and other disorders of the eye. Is the patient up to date with their annual eye exam?  Yes  Who is the provider or what is the name of the office in which the patient attends annual eye exams?  [facility] If pt is not established with a provider, would they like to be referred to a provider to establish care? No .   Dental Screening: Recommended annual dental exams for proper oral hygiene  Community Resource Referral / Chronic Care Management: CRR required this visit?  No   CCM required this visit?  No      Plan:     I have personally reviewed and noted the following in the patient's chart:   Medical and social history Use of alcohol, tobacco or illicit drugs  Current medications and supplements including opioid prescriptions. Patient is not currently taking opioid prescriptions. Functional ability and status Nutritional status Physical activity Advanced directives List of other physicians Hospitalizations, surgeries, and ER visits in previous 12 months Vitals Screenings to include cognitive, depression, and falls Referrals and appointments  In addition, I have reviewed and discussed with patient certain preventive protocols, quality metrics, and best practice recommendations. A written personalized care plan for preventive services as well as general preventive health recommendations were provided to patient.     Roger Shelter, LPN   [id number]/[id number]   Nurse Notes: Pt requests dermatology referral for skin

## 2022-10-13 DIAGNOSIS — M545 Low back pain, unspecified: Secondary | ICD-10-CM | POA: Diagnosis not present

## 2022-10-18 ENCOUNTER — Encounter: Payer: Self-pay | Admitting: *Deleted

## 2022-10-18 DIAGNOSIS — M545 Low back pain, unspecified: Secondary | ICD-10-CM | POA: Diagnosis not present

## 2022-10-21 DIAGNOSIS — M545 Low back pain, unspecified: Secondary | ICD-10-CM | POA: Diagnosis not present

## 2022-10-28 DIAGNOSIS — M545 Low back pain, unspecified: Secondary | ICD-10-CM | POA: Diagnosis not present

## 2022-11-01 DIAGNOSIS — M545 Low back pain, unspecified: Secondary | ICD-10-CM | POA: Diagnosis not present

## 2022-11-03 DIAGNOSIS — M545 Low back pain, unspecified: Secondary | ICD-10-CM | POA: Diagnosis not present

## 2022-11-09 DIAGNOSIS — M545 Low back pain, unspecified: Secondary | ICD-10-CM | POA: Diagnosis not present

## 2022-11-15 DIAGNOSIS — M545 Low back pain, unspecified: Secondary | ICD-10-CM | POA: Diagnosis not present

## 2022-11-22 ENCOUNTER — Other Ambulatory Visit: Payer: Self-pay | Admitting: Family Medicine

## 2022-11-22 DIAGNOSIS — M545 Low back pain, unspecified: Secondary | ICD-10-CM | POA: Diagnosis not present

## 2022-11-22 NOTE — Telephone Encounter (Signed)
Medication Refill - Medication: TRUE METRIX BLOOD GLUCOSE TEST test strip    ( five test strips left)  Patient will be traveling out of town and would like xanax    Has the patient contacted their pharmacy? No.  Preferred Pharmacy (with phone number or street name):  [facility] [id number], [place]  [place] [address]  Phone: [phone] Fax: [phone]  Hours: Not open 24 hours   Has the patient been seen for an appointment in the last year OR does the patient have an upcoming appointment? Yes.    Agent: Please be advised that RX refills may take up to 3 business days. We ask that you follow-up with your pharmacy.

## 2022-11-23 ENCOUNTER — Telehealth: Payer: Self-pay

## 2022-11-23 MED ORDER — TRUE METRIX BLOOD GLUCOSE TEST VI STRP: ORAL_STRIP | 0 refills | Status: DC

## 2022-11-23 NOTE — Telephone Encounter (Signed)
Patient will be traveling out of town and would like xanax    Preferred Pharmacy (with phone number or street name):  [facility] [id number], [place]  [place] [address]  Phone: [phone] Fax: [phone]  Hours: Not open 24 hours

## 2022-11-23 NOTE — Telephone Encounter (Signed)
Send request for Xanax in a separate encounter to the provider.

## 2022-11-23 NOTE — Telephone Encounter (Signed)
Requested Prescriptions  Pending Prescriptions Disp Refills   glucose blood (TRUE METRIX BLOOD GLUCOSE TEST) test strip 100 strip 0    Sig: TEST BLOOD SUGAR ONE TIME DAILY     Endocrinology: Diabetes - Testing Supplies Passed - 11/23/2022 10:22 AM      Passed - Valid encounter within last 12 months    Recent Outpatient Visits           5 months ago Annual physical exam   [facility] Gwyneth Sprout, FNP   5 months ago No-show for appointment   [facility] Tally Joe T, FNP   8 months ago Type 2 diabetes mellitus with hyperglycemia, without long-term current use of insulin [facility])   [place] Gwyneth Sprout, FNP   9 months ago Dizziness   [facility] Gwyneth Sprout, FNP   11 months ago Female genital prolapse, unspecified type   [facility] Gwyneth Sprout, FNP       Future Appointments             In 1 week Wannetta Sender, Governor Rooks, MD [facility] [place] [facility], [facility]   In 4 weeks Gwyneth Sprout, [place], [facility]

## 2022-11-24 NOTE — Telephone Encounter (Signed)
Left detailed message advising as below.  

## 2022-11-25 ENCOUNTER — Other Ambulatory Visit: Payer: Self-pay | Admitting: Family Medicine

## 2022-11-25 ENCOUNTER — Telehealth: Payer: Self-pay | Admitting: Family Medicine

## 2022-11-25 NOTE — Telephone Encounter (Signed)
Pt called saying [facility] do[facility] snot have the test strips.  She says [facility] [address] and [facility] does.  Can they please be sent there.    CB@ [phone]

## 2022-11-25 NOTE — Telephone Encounter (Signed)
Patient is asking for a new glucometer (one touch verio) strips and lancets to be sent in to [facility].  She does not like her other glucometer.

## 2022-11-26 NOTE — Progress Notes (Addendum)
I,Sha'taria Tyson,acting as a Neurosurgeon for Jacky Kindle, FNP.,have documented all relevant documentation on the behalf of Jacky Kindle, FNP,as directed by  Jacky Kindle, FNP while in the presence of Jacky Kindle, FNP.   Established patient visit   Patient: Christine Richards.   DOB: [birth year]   77 y.o. Female  MRN: [id number] Visit Date: 11/29/2022  Today's healthcare provider: Jacky Kindle, FNP  Re Introduced to nurse practitioner role and practice setting.  All questions answered.  Discussed provider/patient relationship and expectations.  No chief complaint on file.  Subjective    HPI  Patient reports she is needing a rx for flying as she will be leaving on the 18th and returning on the 21st. Patient also reports needing a new meter with supplies Diabetes Mellitus Type II, Follow-up  Lab Results  Component Value Date   HGBA1C 7.6 (H) 11/29/2022   HGBA1C 7.9 (H) 06/16/2022   HGBA1C 7.4 (A) 03/16/2022   Wt Readings from Last 3 Encounters:  12/01/22 150 lb 3.2 oz (68.1 kg)  11/29/22 151 lb 14.4 oz (68.9 kg)  09/13/22 153 lb 8 oz (69.6 kg)   Last seen for diabetes 6 months ago.  Management since then includes diet/exercise. She reports poor compliance with treatment. She is not having side effects.  Symptoms: No fatigue No foot ulcerations  No appetite changes No nausea  No paresthesia of the feet  No polydipsia  No polyuria No visual disturbances   No vomiting     Home blood sugar records: fasting range: 130-190  Episodes of hypoglycemia? No    Current insulin regiment: none Most Recent Eye Exam: 05/07/22  Pertinent Labs: Lab Results  Component Value Date   CHOL 337 (H) 11/29/2022   HDL 50 11/29/2022   LDLCALC 238 (H) 11/29/2022   TRIG 241 (H) 11/29/2022   CHOLHDL 6.7 (H) 11/29/2022   Lab Results  Component Value Date   NA 142 11/29/2022   K 4.3 11/29/2022   CREATININE 0.59 11/29/2022   EGFR 93 11/29/2022   LABMICR See below: 02/12/2022   LABMICR 10.2  02/12/2022   MICRALBCREAT 11 02/12/2022     ---------------------------------------------------------------------------------------------------    Medications: Outpatient Medications Prior to Visit  Medication Sig   Berberine Chloride 500 MG CAPS    blood glucose meter kit and supplies Dispense based on patient and insurance preference. To check sugar once daily.   Cholecalciferol (VITAMIN D) 2000 UNITS CAPS Take 4,000 Units by mouth daily.   Cyanocobalamin (VITAMIN B-12) 1000 MCG SUBL Place 1 tablet (1,000 mcg total) under the tongue daily.   estradiol (ESTRACE) 0.1 MG/GM vaginal cream Place 1 Applicatorful vaginally at bedtime.   glucose blood (TRUE METRIX BLOOD GLUCOSE TEST) test strip TEST BLOOD SUGAR ONE TIME DAILY   Omega-3 Fatty Acids (FISH OIL) 1000 MG CAPS Take 1,000-2,000 mg by mouth at bedtime.    TRUEplus Lancets 33G MISC    meloxicam (MOBIC) 15 MG tablet Take 15 mg by mouth daily.   [DISCONTINUED] Metformin HCl 500 MG/5ML SOLN  (Patient not taking: Reported on 09/13/2022)   No facility-administered medications prior to visit.    Review of Systems    Objective    BP 112/64 (BP Location: Right Arm, Patient Position: Sitting, Cuff Size: Normal)   Pulse 64   Ht 5' 5.5" (1.664 m)   Wt 151 lb 14.4 oz (68.9 kg)   SpO2 98%   BMI 24.89 kg/m   Physical Exam Vitals and  nursing note reviewed.  Constitutional:      General: She is not in acute distress.    Appearance: Normal appearance. She is normal weight. She is not ill-appearing, toxic-appearing or diaphoretic.  HENT:     Head: Normocephalic and atraumatic.  Cardiovascular:     Rate and Rhythm: Normal rate and regular rhythm.     Pulses: Normal pulses.     Heart sounds: Normal heart sounds. No murmur heard.    No friction rub. No gallop.  Pulmonary:     Effort: Pulmonary effort is normal. No respiratory distress.     Breath sounds: Normal breath sounds. No stridor. No wheezing, rhonchi or rales.  Chest:      Chest wall: No tenderness.  Musculoskeletal:        General: No swelling, tenderness, deformity or signs of injury. Normal range of motion.     Right lower leg: No edema.     Left lower leg: No edema.  Skin:    General: Skin is warm and dry.     Capillary Refill: Capillary refill takes less than 2 seconds.     Coloration: Skin is not jaundiced or pale.     Findings: No bruising, erythema, lesion or rash.  Neurological:     General: No focal deficit present.     Mental Status: She is alert and oriented to person, place, and time. Mental status is at baseline.     Cranial Nerves: No cranial nerve deficit.     Sensory: No sensory deficit.     Motor: No weakness.     Coordination: Coordination normal.  Psychiatric:        Mood and Affect: Mood normal.        Behavior: Behavior normal.        Thought Content: Thought content normal.        Judgment: Judgment normal.     Results for orders placed or performed in visit on 11/29/22  CBC with Differential/Platelet  Result Value Ref Range   WBC 6.3 3.4 - 10.8 x10E3/uL   RBC 4.25 3.77 - 5.28 x10E6/uL   Hemoglobin 13.5 11.1 - 15.9 g/dL   Hematocrit 40.9 81.1 - 46.6 %   MCV 96 79 - 97 fL   MCH 31.8 26.6 - 33.0 pg   MCHC 33.3 31.5 - 35.7 g/dL   RDW 91.4 (L) 78.2 - 95.6 %   Platelets 241 150 - 450 x10E3/uL   Neutrophils 71 Not Estab. %   Lymphs 17 Not Estab. %   Monocytes 8 Not Estab. %   Eos 2 Not Estab. %   Basos 1 Not Estab. %   Neutrophils Absolute 4.6 1.4 - 7.0 x10E3/uL   Lymphocytes Absolute 1.1 0.7 - 3.1 x10E3/uL   Monocytes Absolute 0.5 0.1 - 0.9 x10E3/uL   EOS (ABSOLUTE) 0.1 0.0 - 0.4 x10E3/uL   Basophils Absolute 0.0 0.0 - 0.2 x10E3/uL   Immature Granulocytes 1 Not Estab. %   Immature Grans (Abs) 0.0 0.0 - 0.1 x10E3/uL  Comprehensive Metabolic Panel (CMET)  Result Value Ref Range   Glucose 160 (H) 70 - 99 mg/dL   BUN 18 8 - 27 mg/dL   Creatinine, Ser 2.13 0.57 - 1.00 mg/dL   eGFR 93 >08 MV/HQI/6.96   BUN/Creatinine  Ratio 31 (H) 12 - 28   Sodium 142 134 - 144 mmol/L   Potassium 4.3 3.5 - 5.2 mmol/L   Chloride 103 96 - 106 mmol/L   CO2 25 20 - 29 mmol/L  Calcium 9.9 8.7 - 10.3 mg/dL   Total Protein 6.3 6.0 - 8.5 g/dL   Albumin 4.4 3.8 - 4.8 g/dL   Globulin, Total 1.9 1.5 - 4.5 g/dL   Albumin/Globulin Ratio 2.3 (H) 1.2 - 2.2   Bilirubin Total 0.3 0.0 - 1.2 mg/dL   Alkaline Phosphatase 101 44 - 121 IU/L   AST 16 0 - 40 IU/L   ALT 18 0 - 32 IU/L  Hemoglobin A1c  Result Value Ref Range   Hgb A1c MFr Bld 7.6 (H) 4.8 - 5.6 %   Est. average glucose Bld gHb Est-mCnc 171 mg/dL  Lipid panel  Result Value Ref Range   Cholesterol, Total 337 (H) 100 - 199 mg/dL   Triglycerides 211 (H) 0 - 149 mg/dL   HDL 50 >94 mg/dL   VLDL Cholesterol Cal 49 (H) 5 - 40 mg/dL   LDL Chol Calc (NIH) 174 (H) 0 - 99 mg/dL   Lipid Comment: Comment    Chol/HDL Ratio 6.7 (H) 0.0 - 4.4 ratio    Assessment & Plan     Problem List Items Addressed This Visit       Respiratory   OSA on CPAP    Chronic, stable Reports >5 years since previous CPAP was obtained Previously reports care from [facility]; however, has not heard from the company and is in need of new supplies Patient reports that she uses her CPAP nightly and is concerned that her previous ResMed AirFit P10 nasal pillow; size small; may be starting to wear out.  She has excellent use and satisfaction with use; and is requesting a new machine and supplies to prevent lapse in coverage. >70% of the time; >4 hours of use nightly She is 100% compliant with use and is currently using 7 mm Hg; however, she is willing to allow her machine to auto-titrate to assist with possible variation of support as recommended by new evidence based practice.         Endocrine   Type 2 diabetes mellitus with hyperglycemia, without long-term current use of insulin    Chronic, stable with diet and exercise with A1c <8% Repeat labs Continue to recommend balanced, lower carb meals. Smaller  meal size, adding snacks. Choosing water as drink of choice and increasing purposeful exercise. Will send in new blood glucose meter as well as repeat A1c      Relevant Orders   CBC with Differential/Platelet (Completed)   Comprehensive Metabolic Panel (CMET) (Completed)   Hemoglobin A1c (Completed)   Lipid panel (Completed)     Other   Fear of flying - Primary    Recommend PRN low dose xanax to assist PDMP reviewed      Relevant Medications   ALPRAZolam (XANAX) 0.5 MG tablet   No follow-ups on file.     Leilani Merl, FNP, have reviewed all documentation for this visit. The documentation on 12/12/22 for the exam, diagnosis, procedures, and orders are all accurate and complete.  Jacky Kindle, FNP  [facility] [phone] (phone) [phone] (fax)  [facility]

## 2022-11-26 NOTE — Telephone Encounter (Signed)
[place] pharmacy to please dispense test strips that were prescribed on 11/23/22.

## 2022-11-26 NOTE — Telephone Encounter (Signed)
Unable to refill per protocol, Rx request last refilled 11/23/22 for [id number] strips, duplicate request.  Requested Prescriptions  Pending Prescriptions Disp Refills   ACCU-CHEK GUIDE test strip [Pharmacy Med Name: ACCU-CHEK GUIDE TEST STRIP] 200 strip     Sig: [place]     Endocrinology: Diabetes - Testing Supplies Passed - 11/25/2022  4:13 PM      Passed - Valid encounter within last 12 months    Recent Outpatient Visits           5 months ago Annual physical exam   [facility] Gwyneth Sprout, FNP   5 months ago No-show for appointment   [facility] Tally Joe T, FNP   8 months ago Type 2 diabetes mellitus with hyperglycemia, without long-term current use of insulin [facility])   [place] Gwyneth Sprout, FNP   9 months ago [place] Gwyneth Sprout, FNP   11 months ago Female genital prolapse, unspecified type   [facility] Gwyneth Sprout, FNP       Future Appointments             In 3 days Gwyneth Sprout, [place], [place]   In 5 days Wannetta Sender, Governor Rooks, MD [facility]   In 3 weeks Gwyneth Sprout, [place], [facility]

## 2022-11-29 ENCOUNTER — Encounter: Payer: Self-pay | Admitting: Family Medicine

## 2022-11-29 ENCOUNTER — Ambulatory Visit (INDEPENDENT_AMBULATORY_CARE_PROVIDER_SITE_OTHER): Payer: Medicare HMO | Admitting: Family Medicine

## 2022-11-29 VITALS — BP 112/64 | HR 64 | Ht 65.5 in | Wt 151.9 lb

## 2022-11-29 DIAGNOSIS — F40243 Fear of flying: Secondary | ICD-10-CM

## 2022-11-29 DIAGNOSIS — G4733 Obstructive sleep apnea (adult) (pediatric): Secondary | ICD-10-CM

## 2022-11-29 DIAGNOSIS — E1165 Type 2 diabetes mellitus with hyperglycemia: Secondary | ICD-10-CM | POA: Diagnosis not present

## 2022-11-29 DIAGNOSIS — R69 Illness, unspecified: Secondary | ICD-10-CM | POA: Diagnosis not present

## 2022-11-29 MED ORDER — LANCETS MISC. MISC: 1.0000 | Freq: Every day | 11 refills | Status: AC

## 2022-11-29 MED ORDER — BLOOD GLUCOSE TEST VI STRP: 1.0000 | ORAL_STRIP | Freq: Every day | 4 refills | Status: DC

## 2022-11-29 MED ORDER — ALPRAZOLAM 0.5 MG PO TABS: 0.5000 mg | ORAL_TABLET | Freq: Two times a day (BID) | ORAL | 0 refills | Status: DC | PRN

## 2022-11-29 MED ORDER — LANCET DEVICE MISC: 1.0000 | Freq: Every day | 0 refills | Status: AC

## 2022-11-29 MED ORDER — BLOOD GLUCOSE MONITORING SUPPL DEVI
1.0000 | Freq: Three times a day (TID) | 0 refills | Status: DC
Start: 2022-11-29 — End: 2024-01-06

## 2022-11-29 NOTE — Assessment & Plan Note (Signed)
Recommend PRN low dose xanax to assist PDMP reviewed

## 2022-11-29 NOTE — Assessment & Plan Note (Signed)
Chronic, stable with diet and exercise with A1c <8% Repeat labs Continue to recommend balanced, lower carb meals. Smaller meal size, adding snacks. Choosing water as drink of choice and increasing purposeful exercise. Will send in new blood glucose meter as well as repeat A1c

## 2022-11-29 NOTE — Assessment & Plan Note (Addendum)
Chronic, stable Reports >5 years since previous CPAP was obtained Previously reports care from [facility]; however, has not heard from the company and is in need of new supplies Patient reports that she uses her CPAP nightly and is concerned that her previous ResMed AirFit P10 nasal pillow; size small; may be starting to wear out.  She has excellent use and satisfaction with use; and is requesting a new machine and supplies to prevent lapse in coverage. >70% of the time; >4 hours of use nightly She is 100% compliant with use and is currently using 7 mm Hg; however, she is willing to allow her machine to auto-titrate to assist with possible variation of support as recommended by new evidence based practice.

## 2022-11-30 ENCOUNTER — Other Ambulatory Visit: Payer: Self-pay | Admitting: Family Medicine

## 2022-11-30 LAB — LIPID PANEL
Chol/HDL Ratio: 6.7 ratio — ABNORMAL HIGH (ref 0.0–4.4)
Cholesterol, Total: 337 mg/dL — ABNORMAL HIGH (ref 100–199)
HDL: 50 mg/dL (ref >39)
LDL Chol Calc (NIH): 238 mg/dL — ABNORMAL HIGH (ref 0–99)
Triglycerides: 241 mg/dL — ABNORMAL HIGH (ref 0–149)
VLDL Cholesterol Cal: 49 mg/dL — ABNORMAL HIGH (ref 5–40)

## 2022-11-30 LAB — COMPREHENSIVE METABOLIC PANEL
ALT: 18 IU/L (ref 0–32)
AST: 16 IU/L (ref 0–40)
Albumin/Globulin Ratio: 2.3 — ABNORMAL HIGH (ref 1.2–2.2)
Albumin: 4.4 g/dL (ref 3.8–4.8)
Alkaline Phosphatase: 101 IU/L (ref 44–121)
BUN/Creatinine Ratio: 31 — ABNORMAL HIGH (ref 12–28)
BUN: 18 mg/dL (ref 8–27)
Bilirubin Total: 0.3 mg/dL (ref 0.0–1.2)
CO2: 25 mmol/L (ref 20–29)
Calcium: 9.9 mg/dL (ref 8.7–10.3)
Chloride: 103 mmol/L (ref 96–106)
Creatinine, Ser: 0.59 mg/dL (ref 0.57–1.00)
Globulin, Total: 1.9 g/dL (ref 1.5–4.5)
Glucose: 160 mg/dL — ABNORMAL HIGH (ref 70–99)
Potassium: 4.3 mmol/L (ref 3.5–5.2)
Sodium: 142 mmol/L (ref 134–144)
Total Protein: 6.3 g/dL (ref 6.0–8.5)
eGFR: 93 mL/min/{1.73_m2} (ref >59)

## 2022-11-30 LAB — CBC WITH DIFFERENTIAL/PLATELET
Basophils Absolute: 0 10*3/uL (ref 0.0–0.2)
Basos: 1 %
EOS (ABSOLUTE): 0.1 10*3/uL (ref 0.0–0.4)
Eos: 2 %
Hematocrit: 40.6 % (ref 34.0–46.6)
Hemoglobin: 13.5 g/dL (ref 11.1–15.9)
Immature Grans (Abs): 0 10*3/uL (ref 0.0–0.1)
Immature Granulocytes: 1 %
Lymphocytes Absolute: 1.1 10*3/uL (ref 0.7–3.1)
Lymphs: 17 %
MCH: 31.8 pg (ref 26.6–33.0)
MCHC: 33.3 g/dL (ref 31.5–35.7)
MCV: 96 fL (ref 79–97)
Monocytes Absolute: 0.5 10*3/uL (ref 0.1–0.9)
Monocytes: 8 %
Neutrophils Absolute: 4.6 10*3/uL (ref 1.4–7.0)
Neutrophils: 71 %
Platelets: 241 10*3/uL (ref 150–450)
RBC: 4.25 x10E6/uL (ref 3.77–5.28)
RDW: 11.6 % — ABNORMAL LOW (ref 11.7–15.4)
WBC: 6.3 10*3/uL (ref 3.4–10.8)

## 2022-11-30 LAB — HEMOGLOBIN A1C
Est. average glucose Bld gHb Est-mCnc: 171 mg/dL
Hgb A1c MFr Bld: 7.6 % — ABNORMAL HIGH (ref 4.8–5.6)

## 2022-11-30 MED ORDER — EZETIMIBE 10 MG PO TABS
10.0000 mg | ORAL_TABLET | Freq: Every day | ORAL | 11 refills | Status: DC
Start: 2022-11-30 — End: 2022-12-01

## 2022-11-30 NOTE — Progress Notes (Signed)
A1c is improved; however, remains above goal for diabetic control of <7%. Continue to recommend balanced, lower carb meals. Smaller meal size, adding snacks. Choosing water as drink of choice and increasing purposeful exercise.  Cholesterol remains elevated throughout panel. Continue to recommend medication, Zetia 10 mg, to assist lifestyle and diet. LDL goal of <70 recommended.

## 2022-12-01 ENCOUNTER — Encounter: Payer: Self-pay | Admitting: Obstetrics and Gynecology

## 2022-12-01 ENCOUNTER — Other Ambulatory Visit: Payer: Self-pay

## 2022-12-01 ENCOUNTER — Ambulatory Visit: Payer: Medicare HMO | Admitting: Obstetrics and Gynecology

## 2022-12-01 VITALS — BP 111/73 | HR 71 | Ht 64.5 in | Wt 150.2 lb

## 2022-12-01 DIAGNOSIS — N812 Incomplete uterovaginal prolapse: Secondary | ICD-10-CM

## 2022-12-01 DIAGNOSIS — R159 Full incontinence of feces: Secondary | ICD-10-CM | POA: Diagnosis not present

## 2022-12-01 DIAGNOSIS — G4733 Obstructive sleep apnea (adult) (pediatric): Secondary | ICD-10-CM

## 2022-12-01 DIAGNOSIS — N8110 Cystocele, unspecified: Secondary | ICD-10-CM

## 2022-12-01 DIAGNOSIS — N816 Rectocele: Secondary | ICD-10-CM
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
## 2022-12-01 NOTE — Progress Notes (Signed)
Referral needed to [employer] for new CPAP. DME placed. Community message sent to [employer] and [employer].

## 2022-12-01 NOTE — Patient Instructions (Signed)
Accidental Bowel Leakage: Our goal is to achieve formed bowel movements daily or every-other-day without leakage.  You may need to try different combinations of the following options to find what works best for you.  Some management options include: Dietary changes (more leafy greens, vegetables and fruits; less processed foods) Fiber supplementation (Metamucil or something with psyllium as active ingredient) Over-the-counter imodium (tablets or liquid) to help solidify the stool and prevent leakage of stool.   

## 2022-12-03 ENCOUNTER — Other Ambulatory Visit: Payer: Self-pay | Admitting: Family Medicine

## 2022-12-03 ENCOUNTER — Ambulatory Visit: Payer: Medicare HMO | Admitting: Obstetrics and Gynecology

## 2022-12-03 DIAGNOSIS — Z1231 Encounter for screening mammogram for malignant neoplasm of breast: Secondary | ICD-10-CM

## 2022-12-07 ENCOUNTER — Institutional Professional Consult (permissible substitution) (HOSPITAL_BASED_OUTPATIENT_CLINIC_OR_DEPARTMENT_OTHER): Payer: Medicare HMO | Admitting: Internal Medicine

## 2022-12-07 DIAGNOSIS — M545 Low back pain, unspecified: Secondary | ICD-10-CM | POA: Diagnosis not present

## 2022-12-15 DIAGNOSIS — L578 Other skin changes due to chronic exposure to nonionizing radiation: Secondary | ICD-10-CM | POA: Diagnosis not present

## 2022-12-15 DIAGNOSIS — D225 Melanocytic nevi of trunk: Secondary | ICD-10-CM | POA: Diagnosis not present

## 2022-12-15 DIAGNOSIS — D2272 Melanocytic nevi of left lower limb, including hip: Secondary | ICD-10-CM | POA: Diagnosis not present

## 2022-12-15 DIAGNOSIS — L570 Actinic keratosis: Secondary | ICD-10-CM | POA: Diagnosis not present

## 2022-12-15 DIAGNOSIS — L821 Other seborrheic keratosis: Secondary | ICD-10-CM | POA: Diagnosis not present

## 2022-12-15 DIAGNOSIS — L80 Vitiligo: Secondary | ICD-10-CM | POA: Diagnosis not present

## 2022-12-15 DIAGNOSIS — D2261 Melanocytic nevi of right upper limb, including shoulder: Secondary | ICD-10-CM | POA: Diagnosis not present

## 2022-12-17 ENCOUNTER — Ambulatory Visit: Payer: Medicare HMO | Admitting: Family Medicine

## 2022-12-20 DIAGNOSIS — M545 Low back pain, unspecified: Secondary | ICD-10-CM | POA: Diagnosis not present

## 2022-12-21 ENCOUNTER — Ambulatory Visit: Payer: Medicare HMO | Admitting: Family Medicine

## 2022-12-22 ENCOUNTER — Ambulatory Visit
Admission: RE | Admit: 2022-12-22 | Discharge: 2022-12-22 | Disposition: A | Discharge status: Home / Self Care | Payer: Medicare HMO | Source: Ambulatory Visit | Attending: Family Medicine | Admitting: Family Medicine

## 2022-12-22 DIAGNOSIS — Z1231 Encounter for screening mammogram for malignant neoplasm of breast: Secondary | ICD-10-CM | POA: Insufficient documentation

## 2022-12-24 ENCOUNTER — Other Ambulatory Visit (INDEPENDENT_AMBULATORY_CARE_PROVIDER_SITE_OTHER): Payer: Self-pay | Admitting: Nurse Practitioner

## 2022-12-24 DIAGNOSIS — I6523 Occlusion and stenosis of bilateral carotid arteries: Secondary | ICD-10-CM

## 2022-12-24 NOTE — Progress Notes (Signed)
Hi Christine Richards  Normal mammogram; repeat in 1 year.  Please let us know if you have any questions.  Thank you,  Merita Norton, FNP

## 2022-12-29 ENCOUNTER — Ambulatory Visit (INDEPENDENT_AMBULATORY_CARE_PROVIDER_SITE_OTHER): Payer: Medicare HMO

## 2022-12-29 ENCOUNTER — Ambulatory Visit (INDEPENDENT_AMBULATORY_CARE_PROVIDER_SITE_OTHER): Payer: Medicare HMO | Admitting: Nurse Practitioner

## 2022-12-29 ENCOUNTER — Encounter (INDEPENDENT_AMBULATORY_CARE_PROVIDER_SITE_OTHER): Payer: Self-pay | Admitting: Nurse Practitioner

## 2022-12-29 VITALS — BP 131/71 | HR 60 | Resp 18 | Ht 65.0 in | Wt 153.6 lb

## 2022-12-29 DIAGNOSIS — E1165 Type 2 diabetes mellitus with hyperglycemia: Secondary | ICD-10-CM

## 2022-12-29 DIAGNOSIS — I6523 Occlusion and stenosis of bilateral carotid arteries: Secondary | ICD-10-CM | POA: Diagnosis not present

## 2022-12-29 NOTE — Progress Notes (Signed)
Subjective:    Patient ID: Christine Richards., female    DOB: 1945-11-29, 77 y.o.   MRN: [id number] Chief Complaint  Patient presents with   Follow-up    . CAROTID.    Christine Richards is a 77 year old female that returns today for follow-up of her carotid stenosis, the carotid stenosis followed by ultrasound.   The patient denies amaurosis fugax. There is no recent history of TIA symptoms or focal motor deficits. There is no prior documented CVA.  The patient's most recent visit was in 2021.  There is no history of migraine headaches. There is no history of seizures.  No recent shortening of the patient's walking distance or new symptoms consistent with claudication.  No history of rest pain symptoms. No new ulcers or wounds of the lower extremities have occurred.  There is no history of DVT, PE or superficial thrombophlebitis. No recent episodes of angina or shortness of breath documented.   Carotid Duplex done today shows 80 to 99% stenosis of the right ICA with 1 to 39% stenosis of the left.  This is a progression from previous study in 2021 which showed a 60 to 79% stenosis of the right ICA with 1 to 39% stenosis of the left ICA.    Review of Systems  All other systems reviewed and are negative.      Objective:   Physical Exam Vitals reviewed.  HENT:     Head: Normocephalic.  Neck:     Vascular: No carotid bruit.  Cardiovascular:     Rate and Rhythm: Normal rate and regular rhythm.     Pulses: Normal pulses.     Heart sounds: Normal heart sounds.  Pulmonary:     Effort: Pulmonary effort is normal.  Skin:    General: Skin is warm and dry.  Neurological:     Mental Status: She is alert and oriented to person, place, and time.  Psychiatric:        Mood and Affect: Mood normal.        Behavior: Behavior normal.        Thought Content: Thought content normal.        Judgment: Judgment normal.     BP 131/71 (BP Location: Right Arm)   Pulse 60   Resp 18   Ht 5\' 5"   (1.651 m)   Wt 153 lb 9.6 oz (69.7 kg)   BMI 25.56 kg/m   Past Medical History:  Diagnosis Date   Arthritis    rheumatoid   Diabetes mellitus without complication (HCC)    Diverticulosis    Environmental and seasonal allergies    Hyperlipidemia    Osteopenia    Sciatica 2024   Sinus congestion    Sleep apnea    uses CPAP   Stroke (HCC)    TIA 6 years ago no issues since   TIA (transient ischemic attack)     Social History   Socioeconomic History   Marital status: Widowed    Spouse name: Not on file   Number of children: 2   Years of education: College   Highest education level: Bachelor's degree (e.g., BA, AB, BS)  Occupational History   Occupation: [employer]    Comment: Full Time  Tobacco Use   Smoking status: Never   Smokeless tobacco: Never  Vaping Use   Vaping Use: Never used  Substance and Sexual Activity   Alcohol use: No   Drug use: No   Sexual activity: Not  Currently  Other Topics Concern   Not on file  Social History Narrative   2 biological children and 1 step child   Social Determinants of Health   Financial Resource Strain: Low Risk  (11/02/2021)   Overall Financial Resource Strain (CARDIA)    Difficulty of Paying Living Expenses: Not hard at all  Food Insecurity: No Food Insecurity (09/13/2022)   Hunger Vital Sign    Worried About Running Out of Food in the Last Year: Never true    Ran Out of Food in the Last Year: Never true  Transportation Needs: No Transportation Needs (09/13/2022)   PRAPARE - Administrator, Civil Service (Medical): No    Lack of Transportation (Non-Medical): No  Physical Activity: Sufficiently Active (09/10/2021)   Exercise Vital Sign    Days of Exercise per Week: 4 days    Minutes of Exercise per Session: 60 min  Stress: No Stress Concern Present (09/13/2022)   [employer] of Occupational Health - Occupational Stress Questionnaire    Feeling of Stress : Not at all  Social  Connections: Moderately Isolated (09/13/2022)   Social Connection and Isolation Panel [NHANES]    Frequency of Communication with Friends and Family: More than three times a week    Frequency of Social Gatherings with Friends and Family: More than three times a week    Attends Religious Services: More than 4 times per year    Active Member of [employer] or Organizations: No    Attends Banker Meetings: Never    Marital Status: Widowed  Intimate Partner Violence: Not At Risk (09/13/2022)   Humiliation, Afraid, Rape, and Kick questionnaire    Fear of Current or Ex-Partner: No    Emotionally Abused: No    Physically Abused: No    Sexually Abused: No    Past Surgical History:  Procedure Laterality Date   BREAST BIOPSY Right 2010   benign   BREAST CYST ASPIRATION Right    BREAST SURGERY Right 2010   biopsy   CATARACT EXTRACTION W/PHACO Right 11/11/2016   Procedure: CATARACT EXTRACTION PHACO AND INTRAOCULAR LENS PLACEMENT (IOC);  Surgeon: Nevada Crane, MD;  Location: [facility];  Service: Ophthalmology;  Laterality: Right;  [place] 1:04.9 AP% 14.6 CDE 9.64 Fluid pack lot # [id number]   CATARACT EXTRACTION W/PHACO Left 09/26/2019   Procedure: CATARACT EXTRACTION PHACO AND INTRAOCULAR LENS PLACEMENT (IOC) LEFT DIABETIC SYMFONY LENS 12.82 01:22.2 15.6%;  Surgeon: Lockie Mola, MD;  Location: [facility];  Service: Ophthalmology;  Laterality: Left;   ESOPHAGOGASTRODUODENOSCOPY (EGD) WITH PROPOFOL N/A 12/29/2015   Procedure: ESOPHAGOGASTRODUODENOSCOPY (EGD) WITH PROPOFOL;  Surgeon: Midge Minium, MD;  Location: [facility];  Service: Endoscopy;  Laterality: N/A;  GASTRI BIOPSY   TUBAL LIGATION      Family History  Problem Relation Age of Onset   Congestive Heart Failure Mother    Heart attack Mother    Dementia Father    Heart attack Father    Prostate cancer Father    Atrial fibrillation Sister    Hyperlipidemia Sister    Heart disease Brother    Breast cancer  Neg Hx     Allergies  Allergen Reactions   Antihistamines, Chlorpheniramine-Type     Facial swelling    Macrobid [Nitrofurantoin Macrocrystal]     unknown   Statins     Muscle Pain, Weakness        Latest Ref Rng & Units 11/29/2022    3:02 PM 06/16/2022  3:23 PM 04/18/2020    3:05 PM  CBC  WBC 3.4 - 10.8 x10E3/uL 6.3  7.8  8.2   Hemoglobin 11.1 - 15.9 g/dL 40.9  81.1  91.4   Hematocrit 34.0 - 46.6 % 40.6  44.2  41.7   Platelets 150 - 450 x10E3/uL 241  279  242       CMP     Component Value Date/Time   NA 142 11/29/2022 1502   NA 138 03/17/2013 1443   K 4.3 11/29/2022 1502   K 3.6 03/17/2013 1443   CL 103 11/29/2022 1502   CL 105 03/17/2013 1443   CO2 25 11/29/2022 1502   CO2 30 03/17/2013 1443   GLUCOSE 160 (H) 11/29/2022 1502   GLUCOSE 189 (H) 11/04/2018 1159   GLUCOSE 192 (H) 03/17/2013 1443   BUN 18 11/29/2022 1502   BUN 16 03/17/2013 1443   CREATININE 0.59 11/29/2022 1502   CREATININE 0.72 03/17/2013 1443   CALCIUM 9.9 11/29/2022 1502   CALCIUM 9.1 03/17/2013 1443   PROT 6.3 11/29/2022 1502   PROT 7.5 03/17/2013 1443   ALBUMIN 4.4 11/29/2022 1502   ALBUMIN 3.8 03/17/2013 1443   AST 16 11/29/2022 1502   AST 20 03/17/2013 1443   ALT 18 11/29/2022 1502   ALT 23 03/17/2013 1443   ALKPHOS 101 11/29/2022 1502   ALKPHOS 103 03/17/2013 1443   BILITOT 0.3 11/29/2022 1502   BILITOT 0.4 03/17/2013 1443   GFRNONAA 88 04/18/2020 1505   GFRNONAA >60 03/17/2013 1443   GFRAA 101 04/18/2020 1505   GFRAA >60 03/17/2013 1443     No results found.     Assessment & Plan:    1. Bilateral carotid artery stenosis The patient remains asymptomatic with respect to the carotid stenosis.  However, the patient has now progressed and has a lesion the is >70%.  Patient should undergo CT angiography of the carotid arteries to define the degree of stenosis of the internal carotid arteries bilaterally and the anatomic suitability for surgery vs. intervention.  If the  patient does indeed need surgery cardiac clearance will be required, once cleared the patient will be scheduled for surgery.  The risks, benefits and alternative therapies were reviewed in detail with the patient.  All questions were answered.  The patient agrees to proceed with imaging.  Continue antiplatelet therapy as prescribed. Continue management of CAD, HTN and Hyperlipidemia. Healthy heart diet, encouraged exercise at least 4 times per week.  - CT ANGIO NECK W OR WO CONTRAST; Future   2. Type 2 diabetes mellitus with hyperglycemia, without long-term current use of insulin (HCC) Currently on no hyperglycemic medications, controlled via diet and exercise    Current Outpatient Medications on File Prior to Visit  Medication Sig Dispense Refill   ALPRAZolam (XANAX) 0.5 MG tablet Take 1 tablet (0.5 mg total) by mouth 2 (two) times daily as needed for anxiety. 20 tablet 0   Berberine Chloride 500 MG CAPS      blood glucose meter kit and supplies Dispense based on patient and insurance preference. To check sugar once daily. 1 each 0   Blood Glucose Monitoring Suppl DEVI 1 each by Does not apply route in the morning, at noon, and at bedtime. May substitute to any manufacturer covered by patient's insurance. 1 each 0   Cholecalciferol (VITAMIN D) 2000 UNITS CAPS Take 4,000 Units by mouth daily.     Cyanocobalamin (VITAMIN B-12) 1000 MCG SUBL Place 1 tablet (1,000 mcg total) under the tongue  daily.  0   estradiol (ESTRACE) 0.1 MG/GM vaginal cream Place 1 Applicatorful vaginally at bedtime.     Glucose Blood (BLOOD GLUCOSE TEST STRIPS) STRP 1 each by In Vitro route daily. May substitute to any manufacturer covered by patient's insurance. 100 strip 4   glucose blood (TRUE METRIX BLOOD GLUCOSE TEST) test strip TEST BLOOD SUGAR ONE TIME DAILY 100 strip 0   Lancet Device MISC 1 each by Does not apply route daily. May substitute to any manufacturer covered by patient's insurance. 1 each 0   Lancets  Misc. MISC 1 each by Does not apply route daily. May substitute to any manufacturer covered by patient's insurance. 100 each 11   meloxicam (MOBIC) 15 MG tablet Take 15 mg by mouth daily.     Omega-3 Fatty Acids (FISH OIL) 1000 MG CAPS Take 1,000-2,000 mg by mouth at bedtime.      TRUEplus Lancets 33G MISC      No current facility-administered medications on file prior to visit.    There are no Patient Instructions on file for this visit. No follow-ups on file.   Georgiana Spinner, NP

## 2022-12-30 ENCOUNTER — Telehealth (INDEPENDENT_AMBULATORY_CARE_PROVIDER_SITE_OTHER): Payer: Self-pay | Admitting: Vascular Surgery

## 2022-12-30 ENCOUNTER — Ambulatory Visit
Admission: RE | Admit: 2022-12-30 | Discharge: 2022-12-30 | Disposition: A | Discharge status: Home / Self Care | Payer: Medicare HMO | Source: Ambulatory Visit | Attending: Nurse Practitioner | Admitting: Nurse Practitioner

## 2022-12-30 DIAGNOSIS — I6523 Occlusion and stenosis of bilateral carotid arteries: Secondary | ICD-10-CM

## 2022-12-30 DIAGNOSIS — I6503 Occlusion and stenosis of bilateral vertebral arteries: Secondary | ICD-10-CM | POA: Diagnosis not present

## 2022-12-30 MED ORDER — IOHEXOL 350 MG/ML SOLN
75.0000 mL | Freq: Once | INTRAVENOUS | Status: AC | PRN
  Administered 2022-12-30: 75 mL via INTRAVENOUS

## 2022-12-30 NOTE — Telephone Encounter (Signed)
Per Sheppard Plumber, NP, I LVM for pt TCB and let us know if she is ok switching her original appt from 5.10.24 to 5.7.24 with Dr. Wyn Quaker. I have switched the appt date/time and asked her to call back to confirm.

## 2023-01-04 ENCOUNTER — Ambulatory Visit: Payer: Medicare HMO | Admitting: Obstetrics and Gynecology

## 2023-01-04 ENCOUNTER — Ambulatory Visit (INDEPENDENT_AMBULATORY_CARE_PROVIDER_SITE_OTHER): Payer: Medicare HMO | Admitting: Vascular Surgery

## 2023-01-04 ENCOUNTER — Encounter (INDEPENDENT_AMBULATORY_CARE_PROVIDER_SITE_OTHER): Payer: Self-pay | Admitting: Vascular Surgery

## 2023-01-04 VITALS — BP 132/70 | HR 66 | Resp 16 | Ht 65.0 in | Wt 155.4 lb

## 2023-01-04 DIAGNOSIS — E1169 Type 2 diabetes mellitus with other specified complication: Secondary | ICD-10-CM | POA: Diagnosis not present

## 2023-01-04 DIAGNOSIS — E1165 Type 2 diabetes mellitus with hyperglycemia: Secondary | ICD-10-CM

## 2023-01-04 DIAGNOSIS — E785 Hyperlipidemia, unspecified: Secondary | ICD-10-CM | POA: Diagnosis not present

## 2023-01-04 DIAGNOSIS — I6523 Occlusion and stenosis of bilateral carotid arteries: Secondary | ICD-10-CM | POA: Diagnosis not present

## 2023-01-04 MED ORDER — CLOPIDOGREL BISULFATE 75 MG PO TABS: 75.0000 mg | ORAL_TABLET | Freq: Every day | ORAL | 6 refills | Status: DC

## 2023-01-04 NOTE — Assessment & Plan Note (Signed)
Since her last visit, she has undergone a CT angiogram which I have independently reviewed.  This shows a high-grade right carotid artery stenosis in the proximal internal carotid artery.  This is interpreted by the radiologist as a radiographic string sign.  This would correlate with the high-grade stenosis velocities seen on her duplex.  Her left carotid stenosis appears to be in the 50% range.  An incidental finding of a C1 fracture which was not entirely healed was also seen.  A long discussion today with the patient regarding these findings.  She has a clearly high-grade stenosis and is at high risk of stroke without surgery or stenting.  Particularly with a C1 fracture, and anatomy that appears to be reasonably favorable for stenting, I would recommend a carotid stent placement over open surgery.  We discussed the risk and benefits of the 2 modalities.  I discussed the reason and rationale for treatment.  A prescription for Plavix will be sent in today and we will get her scheduled for a right carotid stent in the near future at her convenience.

## 2023-01-04 NOTE — Progress Notes (Signed)
MRN : [id number]  Christine Richards is a 77 y.o. (04/06/1946) female who presents with chief complaint of  Chief Complaint  Patient presents with   Follow-up    Follow up ct results  .  History of Present Illness: Patient returns today in follow up of her carotid stenosis.  She has a previous history of TIA over a decade ago but no recent focal neurologic symptoms.  Since her last visit, she has undergone a CT angiogram which I have independently reviewed.  This shows a high-grade right carotid artery stenosis in the proximal internal carotid artery.  This is interpreted by the radiologist as a radiographic string sign.  This would correlate with the high-grade stenosis velocities seen on her duplex.  Her left carotid stenosis appears to be in the 50% range.  An incidental finding of a C1 fracture which was not entirely healed was also seen.  She reports an accident about a year ago where it sounds like that was the most likely cause of the fracture.  Current Outpatient Medications  Medication Sig Dispense Refill   ALPRAZolam (XANAX) 0.5 MG tablet Take 1 tablet (0.5 mg total) by mouth 2 (two) times daily as needed for anxiety. 20 tablet 0   Berberine Chloride 500 MG CAPS      blood glucose meter kit and supplies Dispense based on patient and insurance preference. To check sugar once daily. 1 each 0   Blood Glucose Monitoring Suppl DEVI 1 each by Does not apply route in the morning, at noon, and at bedtime. May substitute to any manufacturer covered by patient's insurance. 1 each 0   Cholecalciferol (VITAMIN D) 2000 UNITS CAPS Take 4,000 Units by mouth daily.     Cyanocobalamin (VITAMIN B-12) 1000 MCG SUBL Place 1 tablet (1,000 mcg total) under the tongue daily.  0   estradiol (ESTRACE) 0.1 MG/GM vaginal cream Place 1 Applicatorful vaginally at bedtime.     Glucose Blood (BLOOD GLUCOSE TEST STRIPS) STRP 1 each by In Vitro route daily. May substitute to any manufacturer covered by patient's  insurance. 100 strip 4   glucose blood (TRUE METRIX BLOOD GLUCOSE TEST) test strip TEST BLOOD SUGAR ONE TIME DAILY 100 strip 0   meloxicam (MOBIC) 15 MG tablet Take 15 mg by mouth daily.     Omega-3 Fatty Acids (FISH OIL) 1000 MG CAPS Take 1,000-2,000 mg by mouth at bedtime.      TRUEplus Lancets 33G MISC      No current facility-administered medications for this visit.    Past Medical History:  Diagnosis Date   Arthritis    rheumatoid   Diabetes mellitus without complication (HCC)    Diverticulosis    Environmental and seasonal allergies    Hyperlipidemia    Osteopenia    Sciatica 2024   Sinus congestion    Sleep apnea    uses CPAP   Stroke (HCC)    TIA 6 years ago no issues since   TIA (transient ischemic attack)     Past Surgical History:  Procedure Laterality Date   BREAST BIOPSY Right 2010   benign   BREAST CYST ASPIRATION Right    BREAST SURGERY Right 2010   biopsy   CATARACT EXTRACTION W/PHACO Right 11/11/2016   Procedure: CATARACT EXTRACTION PHACO AND INTRAOCULAR LENS PLACEMENT (IOC);  Surgeon: Nevada Crane, MD;  Location: [facility];  Service: Ophthalmology;  Laterality: Right;  [place] 1:04.9 AP% 14.6 CDE 9.64 Fluid pack lot # [id number]  CATARACT EXTRACTION W/PHACO Left 09/26/2019   Procedure: CATARACT EXTRACTION PHACO AND INTRAOCULAR LENS PLACEMENT (IOC) LEFT DIABETIC SYMFONY LENS 12.82 01:22.2 15.6%;  Surgeon: Lockie Mola, MD;  Location: [facility];  Service: Ophthalmology;  Laterality: Left;   ESOPHAGOGASTRODUODENOSCOPY (EGD) WITH PROPOFOL N/A 12/29/2015   Procedure: ESOPHAGOGASTRODUODENOSCOPY (EGD) WITH PROPOFOL;  Surgeon: Midge Minium, MD;  Location: [facility];  Service: Endoscopy;  Laterality: N/A;  GASTRI BIOPSY   TUBAL LIGATION       Social History   Tobacco Use   Smoking status: Never   Smokeless tobacco: Never  Vaping Use   Vaping Use: Never used  Substance Use Topics   Alcohol use: No   Drug use: No       Family  History  Problem Relation Age of Onset   Congestive Heart Failure Mother    Heart attack Mother    Dementia Father    Heart attack Father    Prostate cancer Father    Atrial fibrillation Sister    Hyperlipidemia Sister    Heart disease Brother    Breast cancer Neg Hx      Allergies  Allergen Reactions   Antihistamines, Chlorpheniramine-Type     Facial swelling    Macrobid [Nitrofurantoin Macrocrystal]     unknown   Statins     Muscle Pain, Weakness      REVIEW OF SYSTEMS (Negative unless checked)  Constitutional: [] Weight loss  [] Fever  [] Chills Cardiac: [] Chest pain   [] Chest pressure   [] Palpitations   [] Shortness of breath when laying flat   [] Shortness of breath at rest   [] Shortness of breath with exertion. Vascular:  [] Pain in legs with walking   [] Pain in legs at rest   [] Pain in legs when laying flat   [] Claudication   [] Pain in feet when walking  [] Pain in feet at rest  [] Pain in feet when laying flat   [] History of DVT   [] Phlebitis   [] Swelling in legs   [] Varicose veins   [] Non-healing ulcers Pulmonary:   [] Uses home oxygen   [] Productive cough   [] Hemoptysis   [] Wheeze  [] COPD   [] Asthma Neurologic:  [] Dizziness  [] Blackouts   [] Seizures   [] History of stroke   [x] History of TIA  [] Aphasia   [] Temporary blindness   [] Dysphagia   [] Weakness or numbness in arms   [] Weakness or numbness in legs Musculoskeletal:  [] Arthritis   [] Joint swelling   [] Joint pain   [x] Low back pain Hematologic:  [] Easy bruising  [] Easy bleeding   [] Hypercoagulable state   [] Anemic   Gastrointestinal:  [] Blood in stool   [] Vomiting blood  [] Gastroesophageal reflux/heartburn   [] Abdominal pain Genitourinary:  [] Chronic kidney disease   [] Difficult urination  [] Frequent urination  [] Burning with urination   [] Hematuria Skin:  [] Rashes   [] Ulcers   [] Wounds Psychological:  [] History of anxiety   []  History of major depression.  Physical Examination  BP 132/70 (BP Location: Right Arm)    Pulse 66   Resp 16   Ht 5\' 5"  (1.651 m)   Wt 155 lb 6.4 oz (70.5 kg)   BMI 25.86 kg/m  Gen:  WD/WN, NAD. Appears younger than stated age. Head: [place]/AT, No temporalis wasting. Ear/Nose/Throat: Hearing grossly intact, nares w/o erythema or drainage Eyes: Conjunctiva clear. Sclera non-icteric Neck: Supple.  Trachea midline Pulmonary:  Good air movement, no use of accessory muscles.  Cardiac: RRR, no JVD Vascular:  Vessel Right Left  Radial Palpable Palpable  Musculoskeletal: M/S 5/5 throughout.  No deformity or atrophy. No edema. Neurologic: Sensation grossly intact in extremities.  Symmetrical.  Speech is fluent.  Psychiatric: Judgment intact, Mood & affect appropriate for pt's clinical situation. Dermatologic: No rashes or ulcers noted.  No cellulitis or open wounds.      Labs Recent Results (from the past 2160 hour(s))  CBC with Differential/Platelet     Status: Abnormal   Collection Time: 11/29/22  3:02 PM  Result Value Ref Range   WBC 6.3 3.4 - 10.8 x10E3/uL   RBC 4.25 3.77 - 5.28 x10E6/uL   Hemoglobin 13.5 11.1 - 15.9 g/dL   Hematocrit 16.1 09.6 - 46.6 %   MCV 96 79 - 97 fL   MCH 31.8 26.6 - 33.0 pg   MCHC 33.3 31.5 - 35.7 g/dL   RDW 04.5 (L) 40.9 - 81.1 %   Platelets 241 150 - 450 x10E3/uL   Neutrophils 71 Not Estab. %   Lymphs 17 Not Estab. %   Monocytes 8 Not Estab. %   Eos 2 Not Estab. %   Basos 1 Not Estab. %   Neutrophils Absolute 4.6 1.4 - 7.0 x10E3/uL   Lymphocytes Absolute 1.1 0.7 - 3.1 x10E3/uL   Monocytes Absolute 0.5 0.1 - 0.9 x10E3/uL   EOS (ABSOLUTE) 0.1 0.0 - 0.4 x10E3/uL   Basophils Absolute 0.0 0.0 - 0.2 x10E3/uL   Immature Granulocytes 1 Not Estab. %   Immature Grans (Abs) 0.0 0.0 - 0.1 x10E3/uL  Comprehensive Metabolic Panel (CMET)     Status: Abnormal   Collection Time: 11/29/22  3:02 PM  Result Value Ref Range   Glucose 160 (H) 70 - 99 mg/dL   BUN 18 8 - 27 mg/dL   Creatinine, Ser 9.14 0.57 - 1.00 mg/dL   eGFR 93 >78  GN/FAO/1.30   BUN/Creatinine Ratio 31 (H) 12 - 28   Sodium 142 134 - 144 mmol/L   Potassium 4.3 3.5 - 5.2 mmol/L   Chloride 103 96 - 106 mmol/L   CO2 25 20 - 29 mmol/L   Calcium 9.9 8.7 - 10.3 mg/dL   Total Protein 6.3 6.0 - 8.5 g/dL   Albumin 4.4 3.8 - 4.8 g/dL   Globulin, Total 1.9 1.5 - 4.5 g/dL   Albumin/Globulin Ratio 2.3 (H) 1.2 - 2.2   Bilirubin Total 0.3 0.0 - 1.2 mg/dL   Alkaline Phosphatase 101 44 - 121 IU/L   AST 16 0 - 40 IU/L   ALT 18 0 - 32 IU/L  Hemoglobin A1c     Status: Abnormal   Collection Time: 11/29/22  3:02 PM  Result Value Ref Range   Hgb A1c MFr Bld 7.6 (H) 4.8 - 5.6 %    Comment:          Prediabetes: 5.7 - 6.4          Diabetes: >6.4          Glycemic control for adults with diabetes: <7.0    Est. 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 FH should be suspected when fasting LDL cholesterol is above 189 mg/dL or non-HDL cholesterol is above 219 mg/dL. A family history of high cholesterol and heart disease in 1st degree relatives should be collected. J Clin Lipidol 2011;5:133-140    Chol/HDL Ratio 6.7 (H) 0.0 - 4.4 ratio    Comment:                                   T. Chol/HDL Ratio                                             Men  Women                               1/2 Avg.Risk  3.4    3.3                                   Avg.Risk  5.0    4.4                                2X Avg.Risk  9.6    7.1                                3X Avg.Risk 23.4   11.0     Radiology CT ANGIO NECK W OR WO CONTRAST  Result Date: 12/30/2022 CLINICAL DATA:  77 year old female with carotid stenosis. Greater  than 70% stenosis now suspected. EXAM: CT ANGIOGRAPHY NECK TECHNIQUE: Multidetector CT imaging of the neck was performed using the standard protocol during bolus administration of intravenous contrast. Multiplanar CT image reconstructions and MIPs were obtained to evaluate the vascular anatomy. Carotid stenosis measurements (when applicable) are obtained utilizing NASCET criteria, using the distal internal carotid diameter as the denominator. RADIATION DOSE REDUCTION: This exam was performed according to the departmental dose-optimization program which includes automated exposure control, adjustment of the mA and/or kV according to patient size and/or use of iterative reconstruction technique. CONTRAST:  75mL OMNIPAQUE IOHEXOL 350 MG/ML SOLN COMPARISON:  Brain MRI, head and neck MRA 11/02/2011. Carotid Doppler ultrasound 12/29/2017. FINDINGS: Skeleton: Cervical spine degeneration appears fairly age-appropriate. Chronic appearing discontinuity of the right posterolateral C1 ring series 4, image 23 more resembles un healed traumatic fracture than congenital incomplete ossification of the ring. No acute osseous abnormality identified. Upper chest: Negative. Other neck: Negative. Aortic arch: 3 vessel arch.  Mild arch atherosclerosis. Right carotid system: Mild soft brachiocephalic artery plaque without stenosis. Minimal plaque at the right CCA origin. Soft plaque along the medial wall of the right CCA before the bifurcation. And beginning just before the bifurcation there is soft plaque in the lateral wall which continues into the right ICA origin and bulb, superimposed on additional calcified plaque. Radiographic string sign stenosis results (series 4, image 40 and series 7, image 106) over a segment of about 7 or 8 mm. See also series 8, image 62. The right ICA remains patent to the skull base. Visible right ICA siphon is patent with mild calcified plaque. Left carotid system: Mild left  CCA plaque from the origin to  the bifurcation without stenosis. Calcified plaque at the left ICA origin and bulb with up to 50 % stenosis with respect to the distal vessel. Visible left ICA siphon is patent with mild calcified plaque. Vertebral arteries: Normal proximal right subclavian artery and right vertebral artery origin. Right vertebral artery is patent with no significant plaque or stenosis to the vertebrobasilar junction. Right PICA origin is patent. Proximal left subclavian artery atherosclerosis is mild to moderate with no significant stenosis including at the left vertebral artery origin despite adjacent calcified plaque (series 7, image 100). Fairly codominant left vertebral artery is patent to the vertebrobasilar junction with no significant plaque or stenosis. Patent left PICA origin. Review of the MIP images confirms the above findings IMPRESSION: 1. Positive for RADIOGRAPHIC STRING SIGN stenosis of the Right ICA origin over a segment of 7-8 mm. The vessel remains patent. 2. Calcified left ICA origin plaque with 50% stenosis. No significant vertebral artery stenosis. 3. 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 These results will be called to the ordering clinician or representative by the Radiologist Assistant, and communication documented in the PACS or [employer]. Electronically Signed   By: Odessa Fleming M.D.   On: 12/30/2022 10:27   VAS US CAROTID  Result Date: 12/30/2022 Carotid Arterial Duplex Study Patient Name:  Christine Richards.  Date of Exam:   12/29/2022 Medical Rec #: [id number]       Accession #:    [id number] Date of Birth: 1946-01-31        Patient Gender: F Patient Age:   51 years Exam Location:  [facility] Procedure:      VAS US CAROTID Referring Phys: Sheppard Plumber --------------------------------------------------------------------------------  Indications:       Carotid artery disease. Comparison Study:  2021 Performing Technologist: Salvadore Farber RVT  Examination Guidelines: A  complete evaluation includes B-mode imaging, spectral Doppler, color Doppler, and power Doppler as needed of all accessible portions of each vessel. Bilateral testing is considered an integral part of a complete examination. Limited examinations for reoccurring indications may be performed as noted.  Right Carotid Findings: +----------+--------+--------+--------+----------------------+--------+           PSV cm/sEDV cm/sStenosisPlaque Description    Comments +----------+--------+--------+--------+----------------------+--------+ CCA Prox  65      11                                             +----------+--------+--------+--------+----------------------+--------+ CCA Mid   73      13                                             +----------+--------+--------+--------+----------------------+--------+ CCA Distal54      12                                             +----------+--------+--------+--------+----------------------+--------+ ICA Prox  426     118     80-99%  calcific and irregular         +----------+--------+--------+--------+----------------------+--------+ ICA Mid   135     26                                             +----------+--------+--------+--------+----------------------+--------+  ICA Distal80      20                                             +----------+--------+--------+--------+----------------------+--------+ ECA       148     10      >50%                                   +----------+--------+--------+--------+----------------------+--------+ +----------+--------+-------+----------------+-------------------+           PSV cm/sEDV cmsDescribe        Arm Pressure (mmHG) +----------+--------+-------+----------------+-------------------+ Subclavian105            Multiphasic, WNL                    +----------+--------+-------+----------------+-------------------+ +---------+--------+--+--------+--+---------+ VertebralPSV  cm/s46EDV cm/s13Antegrade +---------+--------+--+--------+--+---------+  Left Carotid Findings: +----------+--------+--------+--------+------------------+--------+           PSV cm/sEDV cm/sStenosisPlaque DescriptionComments +----------+--------+--------+--------+------------------+--------+ CCA Prox  69      17                                         +----------+--------+--------+--------+------------------+--------+ CCA Mid   81      20                                         +----------+--------+--------+--------+------------------+--------+ CCA Distal78      21                                         +----------+--------+--------+--------+------------------+--------+ ICA Prox  96      30                                         +----------+--------+--------+--------+------------------+--------+ ICA Mid   106     31      1-39%                              +----------+--------+--------+--------+------------------+--------+ ICA Distal95      30                                         +----------+--------+--------+--------+------------------+--------+ ECA       68      8                                          +----------+--------+--------+--------+------------------+--------+ +----------+--------+--------+----------------+-------------------+           PSV cm/sEDV cm/sDescribe        Arm Pressure (mmHG) +----------+--------+--------+----------------+-------------------+ Subclavian102             Multiphasic, WNL                    +----------+--------+--------+----------------+-------------------+ +---------+--------+--+--------+--+---------+  VertebralPSV cm/s53EDV cm/s14Antegrade +---------+--------+--+--------+--+---------+   Summary: Right Carotid: Velocities in the right ICA are consistent with a 80-99%                stenosis. Non-hemodynamically significant plaque <50% noted in                the CCA. The ECA appears >50%  stenosed. Left Carotid: Velocities in the left ICA are consistent with a 1-39% stenosis.               The ECA appears <50% stenosed. Vertebrals:  Bilateral vertebral arteries demonstrate antegrade flow. Subclavians: Normal flow hemodynamics were seen in bilateral subclavian              arteries. *See table(s) above for measurements and observations.  Electronically signed by Festus Barren MD on 12/30/2022 at 10:03:11 AM.    Final    MM 3D SCREENING MAMMOGRAM BILATERAL BREAST  Result Date: 12/24/2022 CLINICAL DATA:  Screening. EXAM: DIGITAL SCREENING BILATERAL MAMMOGRAM WITH TOMOSYNTHESIS AND CAD TECHNIQUE: Bilateral screening digital craniocaudal and mediolateral oblique mammograms were obtained. Bilateral screening digital breast tomosynthesis was performed. The images were evaluated with computer-aided detection. COMPARISON:  Previous exam(s). ACR Breast Density Category c: The breasts are heterogeneously dense, which may obscure small masses. FINDINGS: There are no findings suspicious for malignancy. IMPRESSION: No mammographic evidence of malignancy. A result letter of this screening mammogram will be mailed directly to the patient. RECOMMENDATION: Screening mammogram in one year. (Code:SM-B-01Y) BI-RADS CATEGORY  1: Negative. Electronically Signed   By: Sherron Ales M.D.   On: 12/24/2022 10:48    Assessment/Plan  Carotid stenosis Since her last visit, she has undergone a CT angiogram which I have independently reviewed.  This shows a high-grade right carotid artery stenosis in the proximal internal carotid artery.  This is interpreted by the radiologist as a radiographic string sign.  This would correlate with the high-grade stenosis velocities seen on her duplex.  Her left carotid stenosis appears to be in the 50% range.  An incidental finding of a C1 fracture which was not entirely healed was also seen.  A long discussion today with the patient regarding these findings.  She has a clearly high-grade  stenosis and is at high risk of stroke without surgery or stenting.  Particularly with a C1 fracture, and anatomy that appears to be reasonably favorable for stenting, I would recommend a carotid stent placement over open surgery.  We discussed the risk and benefits of the 2 modalities.  I discussed the reason and rationale for treatment.  A prescription for Plavix will be sent in today and we will get her scheduled for a right carotid stent in the near future at her convenience.  Type 2 diabetes mellitus with hyperglycemia, without long-term current use of insulin (HCC) blood glucose control important in reducing the progression of atherosclerotic disease. Also, involved in wound healing. On appropriate medications.   Hyperlipidemia associated with type 2 diabetes mellitus (HCC) lipid control important in reducing the progression of atherosclerotic disease. Intolerant to statins.    Festus Barren, MD  01/04/2023 11:26 AM    This note was created with Dragon medical transcription system.  Any errors from dictation are purely unintentional

## 2023-01-04 NOTE — Assessment & Plan Note (Signed)
lipid control important in reducing the progression of atherosclerotic disease. Intolerant to statins  

## 2023-01-04 NOTE — Patient Instructions (Signed)
Carotid Angioplasty With Stent Carotid angioplasty with stent is a procedure to open or widen an artery in the neck (carotid artery) that has become narrowed. This is done by inflating a small balloon inside the artery and then placing a small piece of metal that looks like a coil or spring (stent) inside the artery. The stent helps keep the artery open by supporting the artery walls. The carotid arteries supply blood to the brain. When fats, cholesterol, and other materials (plaque) build up in an artery, the artery becomes narrow and can become blocked. This can reduce or block blood flow to certain areas of the brain, which can cause serious health problems, including stroke. The stent helps to keep the artery open so that blood can flow to the brain. Tell a health care provider about: Any allergies you have. All medicines you are taking, including vitamins, herbs, eye drops, creams, and over-the-counter medicines. Any problems you or family members have had with anesthesia. Any bleeding problems you have. Any surgeries you have had. Any medical conditions you have. Whether you are pregnant or may be pregnant. What are the risks? Your health care provider will talk with you about risks. These may include: Stroke. The stent becoming blocked. Problems at the access site, such as a large amount of blood collecting under your skin (hematoma). Allergic reactions to medicines or dyes. Damage to other structures or organs, or to the carotid artery. Infection. Heart attack. Death. This is rare. What happens before the procedure? Follow instructions from your health care provider about what you may eat and drink. Ask your health care provider about: Changing or stopping your regular medicines. These include any diabetes medicines or blood thinners (anticoagulants) you take. Whether aspirin or other blood thinners are recommended before this procedure. Taking over-the-counter medicines, vitamins,  herbs, and supplements. Do not use any products that contain nicotine or tobacco for at least 4 weeks before the procedure. These products include cigarettes, chewing tobacco, and vaping devices, such as e-cigarettes. If you need help quitting, ask your health care provider. Ask your health care provider: How your surgery site will be marked. What steps will be taken to help prevent infection. These steps may include washing skin with a soap that kills germs. You may have blood tests and imaging tests. What happens during the procedure?  An IV will be inserted into one of your veins. You may be given: A sedative. This helps you relax. Anesthesia. This will: Numb certain areas of your body. Make you fall asleep for surgery. Most commonly, an incision will be made in your groin. In some cases, an incision may be made in your wrist or forearm instead of your groin. A small, thin tube (catheter) will be inserted through an incision and into an artery. The catheter will be threaded upward into your carotid artery. An X-ray machine will help your health care provider guide the catheter to the correct place in your artery. Dye will be injected into the catheter and will travel to the narrow or blocked part of your carotid artery. X-ray images will be taken of how the dye flows through your artery. While the images are being taken, you may be given instructions about breathing, swallowing, moving, or talking. A filter will be inserted into your artery. This will be used to catch plaque that comes loose in your artery during the procedure. This reduces the risk of plaque moving into your brain. A small balloon will be inserted into your artery.   The balloon will be inflated for a few seconds to widen your artery and will then be removed. The stent will be placed in your artery. A second small balloon will be inserted into your artery and inflated. This expands the stent inside of your artery so that the  stent holds the artery walls open. The second balloon will then be removed. The catheter, filter, and first balloon will be removed from your artery and pressure will be held on your carotid artery to stop bleeding. Your incision may be closed with stitches (sutures), skin glue, or adhesive strips. A bandage (dressing) will be placed over your incision. The procedure may vary among health care providers and hospitals. What happens after the procedure? Your blood pressure, heart rate, breathing rate, and blood oxygen level will be monitored until you leave the hospital or clinic. Your mental status and movements (neurological status) will be monitored. You may need to have pressure placed on the incision site to prevent bleeding. You will need to keep the area still for a few hours, or as long as told by your health care provider. If the procedure was done in your groin, you will be told not to bend or cross your legs. Most people stay in the hospital overnight. This information is not intended to replace advice given to you by your health care provider. Make sure you discuss any questions you have with your health care provider. Document Revised: 01/12/2022 Document Reviewed: 01/12/2022 [employer] Patient Education  2023 [employer]  

## 2023-01-04 NOTE — H&P (View-Only) (Signed)
  MRN : [id number]  Christine Hahne V. is a 77 y.o. (01/07/1946) female who presents with chief complaint of  Chief Complaint  Patient presents with   Follow-up    Follow up ct results  .  History of Present Illness: Patient returns today in follow up of her carotid stenosis.  She has a previous history of TIA over a decade ago but no recent focal neurologic symptoms.  Since her last visit, she has undergone a CT angiogram which I have independently reviewed.  This shows a high-grade right carotid artery stenosis in the proximal internal carotid artery.  This is interpreted by the radiologist as a radiographic string sign.  This would correlate with the high-grade stenosis velocities seen on her duplex.  Her left carotid stenosis appears to be in the 50% range.  An incidental finding of a C1 fracture which was not entirely healed was also seen.  She reports an accident about a year ago where it sounds like that was the most likely cause of the fracture.  Current Outpatient Medications  Medication Sig Dispense Refill   ALPRAZolam (XANAX) 0.5 MG tablet Take 1 tablet (0.5 mg total) by mouth 2 (two) times daily as needed for anxiety. 20 tablet 0   Berberine Chloride 500 MG CAPS      blood glucose meter kit and supplies Dispense based on patient and insurance preference. To check sugar once daily. 1 each 0   Blood Glucose Monitoring Suppl DEVI 1 each by Does not apply route in the morning, at noon, and at bedtime. May substitute to any manufacturer covered by patient's insurance. 1 each 0   Cholecalciferol (VITAMIN D) 2000 UNITS CAPS Take 4,000 Units by mouth daily.     Cyanocobalamin (VITAMIN B-12) 1000 MCG SUBL Place 1 tablet (1,000 mcg total) under the tongue daily.  0   estradiol (ESTRACE) 0.1 MG/GM vaginal cream Place 1 Applicatorful vaginally at bedtime.     Glucose Blood (BLOOD GLUCOSE TEST STRIPS) STRP 1 each by In Vitro route daily. May substitute to any manufacturer covered by patient's  insurance. 100 strip 4   glucose blood (TRUE METRIX BLOOD GLUCOSE TEST) test strip TEST BLOOD SUGAR ONE TIME DAILY 100 strip 0   meloxicam (MOBIC) 15 MG tablet Take 15 mg by mouth daily.     Omega-3 Fatty Acids (FISH OIL) 1000 MG CAPS Take 1,000-2,000 mg by mouth at bedtime.      TRUEplus Lancets 33G MISC      No current facility-administered medications for this visit.    Past Medical History:  Diagnosis Date   Arthritis    rheumatoid   Diabetes mellitus without complication (HCC)    Diverticulosis    Environmental and seasonal allergies    Hyperlipidemia    Osteopenia    Sciatica 2024   Sinus congestion    Sleep apnea    uses CPAP   Stroke (HCC)    TIA 6 years ago no issues since   TIA (transient ischemic attack)     Past Surgical History:  Procedure Laterality Date   BREAST BIOPSY Right 2010   benign   BREAST CYST ASPIRATION Right    BREAST SURGERY Right 2010   biopsy   CATARACT EXTRACTION W/PHACO Right 11/11/2016   Procedure: CATARACT EXTRACTION PHACO AND INTRAOCULAR LENS PLACEMENT (IOC);  Surgeon: Bradley Mark King, MD;  Location: [facility];  Service: Ophthalmology;  Laterality: Right;  US 1:04.9 AP% 14.6 CDE 9.64 Fluid pack lot # [id number]     CATARACT EXTRACTION W/PHACO Left 09/26/2019   Procedure: CATARACT EXTRACTION PHACO AND INTRAOCULAR LENS PLACEMENT (IOC) LEFT DIABETIC SYMFONY LENS 12.82 01:22.2 15.6%;  Surgeon: Brasington, Chadwick, MD;  Location: [facility];  Service: Ophthalmology;  Laterality: Left;   ESOPHAGOGASTRODUODENOSCOPY (EGD) WITH PROPOFOL N/A 12/29/2015   Procedure: ESOPHAGOGASTRODUODENOSCOPY (EGD) WITH PROPOFOL;  Surgeon: Darren Wohl, MD;  Location: [facility];  Service: Endoscopy;  Laterality: N/A;  GASTRI BIOPSY   TUBAL LIGATION       Social History   Tobacco Use   Smoking status: Never   Smokeless tobacco: Never  Vaping Use   Vaping Use: Never used  Substance Use Topics   Alcohol use: No   Drug use: No       Family  History  Problem Relation Age of Onset   Congestive Heart Failure Mother    Heart attack Mother    Dementia Father    Heart attack Father    Prostate cancer Father    Atrial fibrillation Sister    Hyperlipidemia Sister    Heart disease Brother    Breast cancer Neg Hx      Allergies  Allergen Reactions   Antihistamines, Chlorpheniramine-Type     Facial swelling    Macrobid [Nitrofurantoin Macrocrystal]     unknown   Statins     Muscle Pain, Weakness      REVIEW OF SYSTEMS (Negative unless checked)  Constitutional: []Weight loss  []Fever  []Chills Cardiac: []Chest pain   []Chest pressure   []Palpitations   []Shortness of breath when laying flat   []Shortness of breath at rest   []Shortness of breath with exertion. Vascular:  []Pain in legs with walking   []Pain in legs at rest   []Pain in legs when laying flat   []Claudication   []Pain in feet when walking  []Pain in feet at rest  []Pain in feet when laying flat   []History of DVT   []Phlebitis   []Swelling in legs   []Varicose veins   []Non-healing ulcers Pulmonary:   []Uses home oxygen   []Productive cough   []Hemoptysis   []Wheeze  []COPD   []Asthma Neurologic:  []Dizziness  []Blackouts   []Seizures   []History of stroke   [x]History of TIA  []Aphasia   []Temporary blindness   []Dysphagia   []Weakness or numbness in arms   []Weakness or numbness in legs Musculoskeletal:  []Arthritis   []Joint swelling   []Joint pain   [x]Low back pain Hematologic:  []Easy bruising  []Easy bleeding   []Hypercoagulable state   []Anemic   Gastrointestinal:  []Blood in stool   []Vomiting blood  []Gastroesophageal reflux/heartburn   []Abdominal pain Genitourinary:  []Chronic kidney disease   []Difficult urination  []Frequent urination  []Burning with urination   []Hematuria Skin:  []Rashes   []Ulcers   []Wounds Psychological:  []History of anxiety   [] History of major depression.  Physical Examination  BP 132/70 (BP Location: Right Arm)    Pulse 66   Resp 16   Ht 5' 5" (1.651 m)   Wt 155 lb 6.4 oz (70.5 kg)   BMI 25.86 kg/m  Gen:  WD/WN, NAD. Appears younger than stated age. Head: [place]/AT, No temporalis wasting. Ear/Nose/Throat: Hearing grossly intact, nares w/o erythema or drainage Eyes: Conjunctiva clear. Sclera non-icteric Neck: Supple.  Trachea midline Pulmonary:  Good air movement, no use of accessory muscles.  Cardiac: RRR, no JVD Vascular:  Vessel Right Left  Radial Palpable Palpable                 Musculoskeletal: M/S 5/5 throughout.  No deformity or atrophy. No edema. Neurologic: Sensation grossly intact in extremities.  Symmetrical.  Speech is fluent.  Psychiatric: Judgment intact, Mood & affect appropriate for pt's clinical situation. Dermatologic: No rashes or ulcers noted.  No cellulitis or open wounds.      Labs Recent Results (from the past 2160 hour(s))  CBC with Differential/Platelet     Status: Abnormal   Collection Time: 11/29/22  3:02 PM  Result Value Ref Range   WBC 6.3 3.4 - 10.8 x10E3/uL   RBC 4.25 3.77 - 5.28 x10E6/uL   Hemoglobin 13.5 11.1 - 15.9 g/dL   Hematocrit 40.6 34.0 - 46.6 %   MCV 96 79 - 97 fL   MCH 31.8 26.6 - 33.0 pg   MCHC 33.3 31.5 - 35.7 g/dL   RDW 11.6 (L) 11.7 - 15.4 %   Platelets 241 150 - 450 x10E3/uL   Neutrophils 71 Not Estab. %   Lymphs 17 Not Estab. %   Monocytes 8 Not Estab. %   Eos 2 Not Estab. %   Basos 1 Not Estab. %   Neutrophils Absolute 4.6 1.4 - 7.0 x10E3/uL   Lymphocytes Absolute 1.1 0.7 - 3.1 x10E3/uL   Monocytes Absolute 0.5 0.1 - 0.9 x10E3/uL   EOS (ABSOLUTE) 0.1 0.0 - 0.4 x10E3/uL   Basophils Absolute 0.0 0.0 - 0.2 x10E3/uL   Immature Granulocytes 1 Not Estab. %   Immature Grans (Abs) 0.0 0.0 - 0.1 x10E3/uL  Comprehensive Metabolic Panel (CMET)     Status: Abnormal   Collection Time: 11/29/22  3:02 PM  Result Value Ref Range   Glucose 160 (H) 70 - 99 mg/dL   BUN 18 8 - 27 mg/dL   Creatinine, Ser 0.59 0.57 - 1.00 mg/dL   eGFR 93 >59  mL/min/1.73   BUN/Creatinine Ratio 31 (H) 12 - 28   Sodium 142 134 - 144 mmol/L   Potassium 4.3 3.5 - 5.2 mmol/L   Chloride 103 96 - 106 mmol/L   CO2 25 20 - 29 mmol/L   Calcium 9.9 8.7 - 10.3 mg/dL   Total Protein 6.3 6.0 - 8.5 g/dL   Albumin 4.4 3.8 - 4.8 g/dL   Globulin, Total 1.9 1.5 - 4.5 g/dL   Albumin/Globulin Ratio 2.3 (H) 1.2 - 2.2   Bilirubin Total 0.3 0.0 - 1.2 mg/dL   Alkaline Phosphatase 101 44 - 121 IU/L   AST 16 0 - 40 IU/L   ALT 18 0 - 32 IU/L  Hemoglobin A1c     Status: Abnormal   Collection Time: 11/29/22  3:02 PM  Result Value Ref Range   Hgb A1c MFr Bld 7.6 (H) 4.8 - 5.6 %    Comment:          Prediabetes: 5.7 - 6.4          Diabetes: >6.4          Glycemic control for adults with diabetes: <7.0    Est. 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 FH should be suspected when fasting LDL cholesterol is above 189 mg/dL or non-HDL cholesterol is above 219 mg/dL. A family history of high cholesterol and heart disease in 1st degree relatives should be collected. J Clin Lipidol 2011;5:133-140    Chol/HDL Ratio 6.7 (H) 0.0 - 4.4 ratio    Comment:                                   T. Chol/HDL Ratio                                             Men  Women                               1/2 Avg.Risk  3.4    3.3                                   Avg.Risk  5.0    4.4                                2X Avg.Risk  9.6    7.1                                3X Avg.Risk 23.4   11.0     Radiology CT ANGIO NECK W OR WO CONTRAST  Result Date: 12/30/2022 CLINICAL DATA:  77-year-old female with carotid stenosis. Greater  than 70% stenosis now suspected. EXAM: CT ANGIOGRAPHY NECK TECHNIQUE: Multidetector CT imaging of the neck was performed using the standard protocol during bolus administration of intravenous contrast. Multiplanar CT image reconstructions and MIPs were obtained to evaluate the vascular anatomy. Carotid stenosis measurements (when applicable) are obtained utilizing NASCET criteria, using the distal internal carotid diameter as the denominator. RADIATION DOSE REDUCTION: This exam was performed according to the departmental dose-optimization program which includes automated exposure control, adjustment of the mA and/or kV according to patient size and/or use of iterative reconstruction technique. CONTRAST:  75mL OMNIPAQUE IOHEXOL 350 MG/ML SOLN COMPARISON:  Brain MRI, head and neck MRA 11/02/2011. Carotid Doppler ultrasound 12/29/2017. FINDINGS: Skeleton: Cervical spine degeneration appears fairly age-appropriate. Chronic appearing discontinuity of the right posterolateral C1 ring series 4, image 23 more resembles un healed traumatic fracture than congenital incomplete ossification of the ring. No acute osseous abnormality identified. Upper chest: Negative. Other neck: Negative. Aortic arch: 3 vessel arch.  Mild arch atherosclerosis. Right carotid system: Mild soft brachiocephalic artery plaque without stenosis. Minimal plaque at the right CCA origin. Soft plaque along the medial wall of the right CCA before the bifurcation. And beginning just before the bifurcation there is soft plaque in the lateral wall which continues into the right ICA origin and bulb, superimposed on additional calcified plaque. Radiographic string sign stenosis results (series 4, image 40 and series 7, image 106) over a segment of about 7 or 8 mm. See also series 8, image 62. The right ICA remains patent to the skull base. Visible right ICA siphon is patent with mild calcified plaque. Left carotid system: Mild left   CCA plaque from the origin to  the bifurcation without stenosis. Calcified plaque at the left ICA origin and bulb with up to 50 % stenosis with respect to the distal vessel. Visible left ICA siphon is patent with mild calcified plaque. Vertebral arteries: Normal proximal right subclavian artery and right vertebral artery origin. Right vertebral artery is patent with no significant plaque or stenosis to the vertebrobasilar junction. Right PICA origin is patent. Proximal left subclavian artery atherosclerosis is mild to moderate with no significant stenosis including at the left vertebral artery origin despite adjacent calcified plaque (series 7, image 100). Fairly codominant left vertebral artery is patent to the vertebrobasilar junction with no significant plaque or stenosis. Patent left PICA origin. Review of the MIP images confirms the above findings IMPRESSION: 1. Positive for RADIOGRAPHIC STRING SIGN stenosis of the Right ICA origin over a segment of 7-8 mm. The vessel remains patent. 2. Calcified left ICA origin plaque with 50% stenosis. No significant vertebral artery stenosis. 3. 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 These results will be called to the ordering clinician or representative by the Radiologist Assistant, and communication documented in the PACS or [facility] Dashboard. Electronically Signed   By: H  Hall M.D.   On: 12/30/2022 10:27   VAS US CAROTID  Result Date: 12/30/2022 Carotid Arterial Duplex Study Patient Name:  Ahriana Cuneo V.  Date of Exam:   12/29/2022 Medical Rec #: [id number]       Accession #:    [id number] Date of Birth: 09/04/1945        Patient Gender: F Patient Age:   77 years Exam Location:  [facility] Procedure:      VAS US CAROTID Referring Phys: FALLON BROWN --------------------------------------------------------------------------------  Indications:       Carotid artery disease. Comparison Study:  2021 Performing Technologist: Terry Knight RVT  Examination Guidelines: A  complete evaluation includes B-mode imaging, spectral Doppler, color Doppler, and power Doppler as needed of all accessible portions of each vessel. Bilateral testing is considered an integral part of a complete examination. Limited examinations for reoccurring indications may be performed as noted.  Right Carotid Findings: +----------+--------+--------+--------+----------------------+--------+           PSV cm/sEDV cm/sStenosisPlaque Description    Comments +----------+--------+--------+--------+----------------------+--------+ CCA Prox  65      11                                             +----------+--------+--------+--------+----------------------+--------+ CCA Mid   73      13                                             +----------+--------+--------+--------+----------------------+--------+ CCA Distal54      12                                             +----------+--------+--------+--------+----------------------+--------+ ICA Prox  426     118     80-99%  calcific and irregular         +----------+--------+--------+--------+----------------------+--------+ ICA Mid   135     26                                             +----------+--------+--------+--------+----------------------+--------+   ICA Distal80      20                                             +----------+--------+--------+--------+----------------------+--------+ ECA       148     10      >50%                                   +----------+--------+--------+--------+----------------------+--------+ +----------+--------+-------+----------------+-------------------+           PSV cm/sEDV cmsDescribe        Arm Pressure (mmHG) +----------+--------+-------+----------------+-------------------+ Subclavian105            Multiphasic, WNL                    +----------+--------+-------+----------------+-------------------+ +---------+--------+--+--------+--+---------+ VertebralPSV  cm/s46EDV cm/s13Antegrade +---------+--------+--+--------+--+---------+  Left Carotid Findings: +----------+--------+--------+--------+------------------+--------+           PSV cm/sEDV cm/sStenosisPlaque DescriptionComments +----------+--------+--------+--------+------------------+--------+ CCA Prox  69      17                                         +----------+--------+--------+--------+------------------+--------+ CCA Mid   81      20                                         +----------+--------+--------+--------+------------------+--------+ CCA Distal78      21                                         +----------+--------+--------+--------+------------------+--------+ ICA Prox  96      30                                         +----------+--------+--------+--------+------------------+--------+ ICA Mid   106     31      1-39%                              +----------+--------+--------+--------+------------------+--------+ ICA Distal95      30                                         +----------+--------+--------+--------+------------------+--------+ ECA       68      8                                          +----------+--------+--------+--------+------------------+--------+ +----------+--------+--------+----------------+-------------------+           PSV cm/sEDV cm/sDescribe        Arm Pressure (mmHG) +----------+--------+--------+----------------+-------------------+ Subclavian102             Multiphasic, WNL                    +----------+--------+--------+----------------+-------------------+ +---------+--------+--+--------+--+---------+   VertebralPSV cm/s53EDV cm/s14Antegrade +---------+--------+--+--------+--+---------+   Summary: Right Carotid: Velocities in the right ICA are consistent with a 80-99%                stenosis. Non-hemodynamically significant plaque <50% noted in                the CCA. The ECA appears >50%  stenosed. Left Carotid: Velocities in the left ICA are consistent with a 1-39% stenosis.               The ECA appears <50% stenosed. Vertebrals:  Bilateral vertebral arteries demonstrate antegrade flow. Subclavians: Normal flow hemodynamics were seen in bilateral subclavian              arteries. *See table(s) above for measurements and observations.  Electronically signed by Azariel Banik MD on 12/30/2022 at 10:03:11 AM.    Final    MM 3D SCREENING MAMMOGRAM BILATERAL BREAST  Result Date: 12/24/2022 CLINICAL DATA:  Screening. EXAM: DIGITAL SCREENING BILATERAL MAMMOGRAM WITH TOMOSYNTHESIS AND CAD TECHNIQUE: Bilateral screening digital craniocaudal and mediolateral oblique mammograms were obtained. Bilateral screening digital breast tomosynthesis was performed. The images were evaluated with computer-aided detection. COMPARISON:  Previous exam(s). ACR Breast Density Category c: The breasts are heterogeneously dense, which may obscure small masses. FINDINGS: There are no findings suspicious for malignancy. IMPRESSION: No mammographic evidence of malignancy. A result letter of this screening mammogram will be mailed directly to the patient. RECOMMENDATION: Screening mammogram in one year. (Code:SM-B-01Y) BI-RADS CATEGORY  1: Negative. Electronically Signed   By: Laura  Parra M.D.   On: 12/24/2022 10:48    Assessment/Plan  Carotid stenosis Since her last visit, she has undergone a CT angiogram which I have independently reviewed.  This shows a high-grade right carotid artery stenosis in the proximal internal carotid artery.  This is interpreted by the radiologist as a radiographic string sign.  This would correlate with the high-grade stenosis velocities seen on her duplex.  Her left carotid stenosis appears to be in the 50% range.  An incidental finding of a C1 fracture which was not entirely healed was also seen.  A long discussion today with the patient regarding these findings.  She has a clearly high-grade  stenosis and is at high risk of stroke without surgery or stenting.  Particularly with a C1 fracture, and anatomy that appears to be reasonably favorable for stenting, I would recommend a carotid stent placement over open surgery.  We discussed the risk and benefits of the 2 modalities.  I discussed the reason and rationale for treatment.  A prescription for Plavix will be sent in today and we will get her scheduled for a right carotid stent in the near future at her convenience.  Type 2 diabetes mellitus with hyperglycemia, without long-term current use of insulin (HCC) blood glucose control important in reducing the progression of atherosclerotic disease. Also, involved in wound healing. On appropriate medications.   Hyperlipidemia associated with type 2 diabetes mellitus (HCC) lipid control important in reducing the progression of atherosclerotic disease. Intolerant to statins.    Grason Brailsford, MD  01/04/2023 11:26 AM    This note was created with Dragon medical transcription system.  Any errors from dictation are purely unintentional 

## 2023-01-04 NOTE — Assessment & Plan Note (Signed)
blood glucose control important in reducing the progression of atherosclerotic disease. Also, involved in wound healing. On appropriate medications.  

## 2023-01-05 DIAGNOSIS — M545 Low back pain, unspecified: Secondary | ICD-10-CM | POA: Diagnosis not present

## 2023-01-07 ENCOUNTER — Ambulatory Visit (INDEPENDENT_AMBULATORY_CARE_PROVIDER_SITE_OTHER): Payer: Medicare HMO | Admitting: Vascular Surgery

## 2023-01-12 ENCOUNTER — Telehealth (INDEPENDENT_AMBULATORY_CARE_PROVIDER_SITE_OTHER): Payer: Self-pay

## 2023-01-12 NOTE — Telephone Encounter (Signed)
Spoke with the patient and she is scheduled with Dr. Wyn Quaker on 01/17/23 with a  8:30 am arrival time to the [facility] for a right carotid stent placement. Pre-procedure instructions were discussed and will be sent to Mychart and mailed.

## 2023-01-17 ENCOUNTER — Other Ambulatory Visit: Payer: Self-pay

## 2023-01-17 ENCOUNTER — Encounter
Admission: RE | Disposition: A | Discharge status: Home / Self Care | Payer: Self-pay | Source: Home / Self Care | Attending: Vascular Surgery

## 2023-01-17 ENCOUNTER — Inpatient Hospital Stay
Admission: RE | Admit: 2023-01-17 | Discharge: 2023-01-20 | DRG: 035 | Disposition: A | Discharge status: Home / Self Care | Payer: Medicare HMO | Attending: Vascular Surgery | Admitting: Vascular Surgery

## 2023-01-17 ENCOUNTER — Encounter: Payer: Self-pay | Admitting: Vascular Surgery

## 2023-01-17 DIAGNOSIS — R946 Abnormal results of thyroid function studies: Secondary | ICD-10-CM | POA: Diagnosis not present

## 2023-01-17 DIAGNOSIS — Z881 Allergy status to other antibiotic agents status: Secondary | ICD-10-CM

## 2023-01-17 DIAGNOSIS — I9589 Other hypotension: Secondary | ICD-10-CM | POA: Diagnosis not present

## 2023-01-17 DIAGNOSIS — I4891 Unspecified atrial fibrillation: Secondary | ICD-10-CM

## 2023-01-17 DIAGNOSIS — I480 Paroxysmal atrial fibrillation: Secondary | ICD-10-CM | POA: Diagnosis not present

## 2023-01-17 DIAGNOSIS — I959 Hypotension, unspecified: Secondary | ICD-10-CM | POA: Diagnosis not present

## 2023-01-17 DIAGNOSIS — E1169 Type 2 diabetes mellitus with other specified complication: Secondary | ICD-10-CM | POA: Diagnosis present

## 2023-01-17 DIAGNOSIS — Z820 Family history of epilepsy and other diseases of the nervous system: Secondary | ICD-10-CM | POA: Diagnosis not present

## 2023-01-17 DIAGNOSIS — Z888 Allergy status to other drugs, medicaments and biological substances status: Secondary | ICD-10-CM | POA: Diagnosis not present

## 2023-01-17 DIAGNOSIS — I2489 Other forms of acute ischemic heart disease: Secondary | ICD-10-CM

## 2023-01-17 DIAGNOSIS — Z79899 Other long term (current) drug therapy: Secondary | ICD-10-CM

## 2023-01-17 DIAGNOSIS — E1165 Type 2 diabetes mellitus with hyperglycemia: Secondary | ICD-10-CM | POA: Diagnosis present

## 2023-01-17 DIAGNOSIS — Z79818 Long term (current) use of other agents affecting estrogen receptors and estrogen levels: Secondary | ICD-10-CM | POA: Diagnosis not present

## 2023-01-17 DIAGNOSIS — Z8042 Family history of malignant neoplasm of prostate: Secondary | ICD-10-CM | POA: Diagnosis not present

## 2023-01-17 DIAGNOSIS — S129XXS Fracture of neck, unspecified, sequela: Secondary | ICD-10-CM | POA: Diagnosis not present

## 2023-01-17 DIAGNOSIS — E782 Mixed hyperlipidemia: Secondary | ICD-10-CM | POA: Diagnosis present

## 2023-01-17 DIAGNOSIS — I5A Non-ischemic myocardial injury (non-traumatic): Secondary | ICD-10-CM | POA: Diagnosis not present

## 2023-01-17 DIAGNOSIS — Z83438 Family history of other disorder of lipoprotein metabolism and other lipidemia: Secondary | ICD-10-CM

## 2023-01-17 DIAGNOSIS — I6521 Occlusion and stenosis of right carotid artery: Principal | ICD-10-CM | POA: Diagnosis present

## 2023-01-17 DIAGNOSIS — G4733 Obstructive sleep apnea (adult) (pediatric): Secondary | ICD-10-CM | POA: Diagnosis not present

## 2023-01-17 DIAGNOSIS — Z8673 Personal history of transient ischemic attack (TIA), and cerebral infarction without residual deficits: Secondary | ICD-10-CM | POA: Diagnosis not present

## 2023-01-17 DIAGNOSIS — D539 Nutritional anemia, unspecified: Secondary | ICD-10-CM | POA: Diagnosis not present

## 2023-01-17 DIAGNOSIS — Z791 Long term (current) use of non-steroidal anti-inflammatories (NSAID): Secondary | ICD-10-CM

## 2023-01-17 DIAGNOSIS — Z8249 Family history of ischemic heart disease and other diseases of the circulatory system: Secondary | ICD-10-CM | POA: Diagnosis not present

## 2023-01-17 DIAGNOSIS — E785 Hyperlipidemia, unspecified: Secondary | ICD-10-CM | POA: Diagnosis present

## 2023-01-17 DIAGNOSIS — R001 Bradycardia, unspecified: Secondary | ICD-10-CM | POA: Diagnosis not present

## 2023-01-17 DIAGNOSIS — M069 Rheumatoid arthritis, unspecified: Secondary | ICD-10-CM | POA: Diagnosis present

## 2023-01-17 DIAGNOSIS — R079 Chest pain, unspecified: Secondary | ICD-10-CM | POA: Diagnosis not present

## 2023-01-17 HISTORY — PX: CAROTID PTA/STENT INTERVENTION: CATH118231

## 2023-01-17 HISTORY — DX: Disorder of arteries and arterioles, unspecified: I77.9

## 2023-01-17 LAB — CREATININE, SERUM
Creatinine, Ser: 0.6 mg/dL (ref 0.44–1.00)
GFR, Estimated: >60 mL/min (ref >60)

## 2023-01-17 LAB — TROPONIN I (HIGH SENSITIVITY)
Troponin I (High Sensitivity): 3 ng/L (ref <18)
Troponin I (High Sensitivity): 4 ng/L (ref <18)

## 2023-01-17 LAB — BUN: BUN: 19 mg/dL (ref 8–23)

## 2023-01-17 LAB — POCT ACTIVATED CLOTTING TIME: Activated Clotting Time: 282 seconds

## 2023-01-17 LAB — GLUCOSE, CAPILLARY: Glucose-Capillary: 155 mg/dL — ABNORMAL HIGH (ref 70–99)

## 2023-01-17 LAB — MRSA NEXT GEN BY PCR, NASAL: MRSA by PCR Next Gen: NOT DETECTED

## 2023-01-17 SURGERY — CAROTID PTA/STENT INTERVENTION
Anesthesia: Moderate Sedation | Laterality: Right

## 2023-01-17 MED ORDER — ATROPINE SULFATE 0.4 MG/ML IV SOLN
INTRAVENOUS | Status: DC | PRN
  Administered 2023-01-17: 1 mg via INTRAVENOUS

## 2023-01-17 MED ORDER — ATROPINE SULFATE 1 MG/10ML IJ SOSY
PREFILLED_SYRINGE | INTRAMUSCULAR | Status: AC
  Filled 2023-01-17: qty 20

## 2023-01-17 MED ORDER — HEPARIN SODIUM (PORCINE) 1000 UNIT/ML IJ SOLN
INTRAMUSCULAR | Status: AC
  Filled 2023-01-17: qty 10

## 2023-01-17 MED ORDER — ESTRADIOL 0.1 MG/GM VA CREA
1.0000 | TOPICAL_CREAM | Freq: Every day | VAGINAL | Status: DC
  Administered 2023-01-18: 1 via VAGINAL
  Filled 2023-01-17: qty 42.5

## 2023-01-17 MED ORDER — SODIUM CHLORIDE 0.9 % IV SOLN
INTRAVENOUS | Status: DC
  Administered 2023-01-17: 250 mL via INTRAVENOUS

## 2023-01-17 MED ORDER — DOPAMINE-DEXTROSE 3.2-5 MG/ML-% IV SOLN
INTRAVENOUS | Status: AC
  Filled 2023-01-17: qty 250

## 2023-01-17 MED ORDER — FAMOTIDINE 20 MG PO TABS: 40.0000 mg | ORAL_TABLET | Freq: Once | ORAL | Status: DC | PRN

## 2023-01-17 MED ORDER — DOPAMINE-DEXTROSE 3.2-5 MG/ML-% IV SOLN
0.0000 ug/kg/min | INTRAVENOUS | Status: DC
  Administered 2023-01-17: 5 ug/kg/min via INTRAVENOUS
  Filled 2023-01-17: qty 250

## 2023-01-17 MED ORDER — OMEGA-3-ACID ETHYL ESTERS 1 G PO CAPS
2.0000 g | ORAL_CAPSULE | Freq: Every day | ORAL | Status: DC
  Administered 2023-01-17 – 2023-01-20 (×4): 2 g via ORAL
  Filled 2023-01-17 (×4): qty 2

## 2023-01-17 MED ORDER — VITAMIN B-12 1000 MCG PO TABS
1000.0000 ug | ORAL_TABLET | Freq: Every day | ORAL | Status: DC
  Administered 2023-01-17 – 2023-01-20 (×4): 1000 ug via ORAL
  Filled 2023-01-17 (×4): qty 1

## 2023-01-17 MED ORDER — CHLORHEXIDINE GLUCONATE CLOTH 2 % EX PADS
6.0000 | MEDICATED_PAD | Freq: Every day | CUTANEOUS | Status: DC
  Administered 2023-01-17 – 2023-01-20 (×4): 6 via TOPICAL

## 2023-01-17 MED ORDER — SODIUM CHLORIDE 0.9 % IV SOLN: 250.0000 mL | INTRAVENOUS | Status: DC

## 2023-01-17 MED ORDER — MORPHINE SULFATE (PF) 4 MG/ML IV SOLN
2.0000 mg | INTRAVENOUS | Status: DC | PRN
  Administered 2023-01-19: 4 mg via INTRAVENOUS
  Filled 2023-01-17: qty 1

## 2023-01-17 MED ORDER — PHENYLEPHRINE HCL-NACL 20-0.9 MG/250ML-% IV SOLN
25.0000 ug/min | INTRAVENOUS | Status: DC
  Administered 2023-01-17: 25 ug/min via INTRAVENOUS
  Filled 2023-01-17: qty 250

## 2023-01-17 MED ORDER — FAMOTIDINE IN NACL 20-0.9 MG/50ML-% IV SOLN
20.0000 mg | Freq: Two times a day (BID) | INTRAVENOUS | Status: DC
  Administered 2023-01-17 – 2023-01-19 (×5): 20 mg via INTRAVENOUS
  Filled 2023-01-17 (×6): qty 50

## 2023-01-17 MED ORDER — ALPHA LIPOIC ACID 200 MG PO CAPS: ORAL_CAPSULE | Freq: Every day | ORAL | Status: DC

## 2023-01-17 MED ORDER — LABETALOL HCL 5 MG/ML IV SOLN
10.0000 mg | INTRAVENOUS | Status: DC | PRN
  Administered 2023-01-18: 10 mg via INTRAVENOUS
  Filled 2023-01-17: qty 4

## 2023-01-17 MED ORDER — CEFAZOLIN SODIUM-DEXTROSE 2-4 GM/100ML-% IV SOLN
2.0000 g | Freq: Three times a day (TID) | INTRAVENOUS | Status: AC
  Administered 2023-01-17 – 2023-01-18 (×2): 2 g via INTRAVENOUS
  Filled 2023-01-17 (×2): qty 100

## 2023-01-17 MED ORDER — HYDROMORPHONE HCL 1 MG/ML IJ SOLN: 1.0000 mg | Freq: Once | INTRAMUSCULAR | Status: DC | PRN

## 2023-01-17 MED ORDER — MAGNESIUM SULFATE 2 GM/50ML IV SOLN: 2.0000 g | Freq: Every day | INTRAVENOUS | Status: DC | PRN

## 2023-01-17 MED ORDER — MIDAZOLAM HCL 2 MG/2ML IJ SOLN
INTRAMUSCULAR | Status: DC | PRN
  Administered 2023-01-17 (×2): 1 mg via INTRAVENOUS

## 2023-01-17 MED ORDER — SODIUM CHLORIDE 0.9 % IV SOLN: INTRAVENOUS | Status: DC

## 2023-01-17 MED ORDER — METOPROLOL TARTRATE 5 MG/5ML IV SOLN
2.0000 mg | INTRAVENOUS | Status: DC | PRN
  Administered 2023-01-19: 5 mg via INTRAVENOUS
  Filled 2023-01-17: qty 5

## 2023-01-17 MED ORDER — ONDANSETRON HCL 4 MG/2ML IJ SOLN
4.0000 mg | Freq: Four times a day (QID) | INTRAMUSCULAR | Status: DC | PRN
  Administered 2023-01-17 (×2): 4 mg via INTRAVENOUS
  Filled 2023-01-17 (×2): qty 2

## 2023-01-17 MED ORDER — GUAIFENESIN-DM 100-10 MG/5ML PO SYRP: 15.0000 mL | ORAL_SOLUTION | ORAL | Status: DC | PRN

## 2023-01-17 MED ORDER — METHYLPREDNISOLONE SODIUM SUCC 125 MG IJ SOLR: 125.0000 mg | Freq: Once | INTRAMUSCULAR | Status: DC | PRN

## 2023-01-17 MED ORDER — ONDANSETRON HCL 4 MG/2ML IJ SOLN: 4.0000 mg | Freq: Four times a day (QID) | INTRAMUSCULAR | Status: DC | PRN

## 2023-01-17 MED ORDER — VITAMIN D 25 MCG (1000 UNIT) PO TABS
4000.0000 [IU] | ORAL_TABLET | Freq: Every day | ORAL | Status: DC
  Administered 2023-01-17 – 2023-01-20 (×4): 4000 [IU] via ORAL
  Filled 2023-01-17 (×5): qty 4

## 2023-01-17 MED ORDER — FENTANYL CITRATE (PF) 100 MCG/2ML IJ SOLN
INTRAMUSCULAR | Status: AC
  Filled 2023-01-17: qty 2

## 2023-01-17 MED ORDER — HEPARIN SODIUM (PORCINE) 1000 UNIT/ML IJ SOLN
INTRAMUSCULAR | Status: DC | PRN
  Administered 2023-01-17: 8000 [IU] via INTRAVENOUS

## 2023-01-17 MED ORDER — PHENOL 1.4 % MT LIQD: 1.0000 | OROMUCOSAL | Status: DC | PRN

## 2023-01-17 MED ORDER — POTASSIUM CHLORIDE CRYS ER 20 MEQ PO TBCR: 20.0000 meq | EXTENDED_RELEASE_TABLET | Freq: Every day | ORAL | Status: DC | PRN

## 2023-01-17 MED ORDER — ALPRAZOLAM 0.5 MG PO TABS: 0.5000 mg | ORAL_TABLET | Freq: Two times a day (BID) | ORAL | Status: DC | PRN

## 2023-01-17 MED ORDER — ACETAMINOPHEN 325 MG PO TABS: 325.0000 mg | ORAL_TABLET | ORAL | Status: DC | PRN

## 2023-01-17 MED ORDER — SODIUM CHLORIDE 0.9 % IV SOLN
500.0000 mL | Freq: Once | INTRAVENOUS | Status: AC | PRN
  Administered 2023-01-17: 500 mL via INTRAVENOUS

## 2023-01-17 MED ORDER — OXYCODONE-ACETAMINOPHEN 5-325 MG PO TABS: 1.0000 | ORAL_TABLET | ORAL | Status: DC | PRN

## 2023-01-17 MED ORDER — ACETAMINOPHEN 325 MG RE SUPP: 325.0000 mg | RECTAL | Status: DC | PRN

## 2023-01-17 MED ORDER — MIDAZOLAM HCL 2 MG/2ML IJ SOLN
INTRAMUSCULAR | Status: AC
  Filled 2023-01-17: qty 4

## 2023-01-17 MED ORDER — PHENYLEPHRINE HCL-NACL 20-0.9 MG/250ML-% IV SOLN
INTRAVENOUS | Status: AC
  Filled 2023-01-17: qty 250

## 2023-01-17 MED ORDER — ALUM & MAG HYDROXIDE-SIMETH 200-200-20 MG/5ML PO SUSP: 15.0000 mL | ORAL | Status: DC | PRN

## 2023-01-17 MED ORDER — DIPHENHYDRAMINE HCL 50 MG/ML IJ SOLN: 50.0000 mg | Freq: Once | INTRAMUSCULAR | Status: DC | PRN

## 2023-01-17 MED ORDER — ATROPINE SULFATE 1 MG/ML IV SOLN
INTRAVENOUS | Status: DC | PRN
  Administered 2023-01-17: 1 mg via INTRAVENOUS

## 2023-01-17 MED ORDER — CEFAZOLIN SODIUM-DEXTROSE 2-4 GM/100ML-% IV SOLN
2.0000 g | INTRAVENOUS | Status: AC
  Administered 2023-01-17: 2 g via INTRAVENOUS

## 2023-01-17 MED ORDER — CLOPIDOGREL BISULFATE 75 MG PO TABS
75.0000 mg | ORAL_TABLET | Freq: Every day | ORAL | Status: DC
  Administered 2023-01-17 – 2023-01-20 (×4): 75 mg via ORAL
  Filled 2023-01-17 (×4): qty 1

## 2023-01-17 MED ORDER — FENTANYL CITRATE (PF) 100 MCG/2ML IJ SOLN
INTRAMUSCULAR | Status: DC | PRN
  Administered 2023-01-17: 50 ug via INTRAVENOUS
  Administered 2023-01-17: 25 ug via INTRAVENOUS

## 2023-01-17 MED ORDER — CEFAZOLIN SODIUM-DEXTROSE 2-4 GM/100ML-% IV SOLN
INTRAVENOUS | Status: AC
  Filled 2023-01-17: qty 100

## 2023-01-17 MED ORDER — PHENYLEPHRINE 80 MCG/ML (10ML) SYRINGE FOR IV PUSH (FOR BLOOD PRESSURE SUPPORT)
PREFILLED_SYRINGE | INTRAVENOUS | Status: AC
  Filled 2023-01-17: qty 10

## 2023-01-17 MED ORDER — HYDRALAZINE HCL 20 MG/ML IJ SOLN: 5.0000 mg | INTRAMUSCULAR | Status: DC | PRN

## 2023-01-17 MED ORDER — ASPIRIN 81 MG PO TBEC
81.0000 mg | DELAYED_RELEASE_TABLET | Freq: Every day | ORAL | Status: DC
  Administered 2023-01-18 – 2023-01-20 (×3): 81 mg via ORAL
  Filled 2023-01-17 (×3): qty 1

## 2023-01-17 MED ORDER — SODIUM CHLORIDE 0.9 % IV BOLUS
250.0000 mL | Freq: Once | INTRAVENOUS | Status: AC
  Administered 2023-01-17: 250 mL via INTRAVENOUS

## 2023-01-17 MED ORDER — MIDAZOLAM HCL 2 MG/ML PO SYRP: 8.0000 mg | ORAL_SOLUTION | Freq: Once | ORAL | Status: DC | PRN

## 2023-01-17 MED ORDER — IODIXANOL 320 MG/ML IV SOLN
INTRAVENOUS | Status: DC | PRN
  Administered 2023-01-17: 50 mL

## 2023-01-17 MED ORDER — DOPAMINE-DEXTROSE 3.2-5 MG/ML-% IV SOLN
0.0000 ug/kg/min | INTRAVENOUS | Status: DC
  Administered 2023-01-17 – 2023-01-18 (×2): 5 ug/kg/min via INTRAVENOUS
  Filled 2023-01-17 (×2): qty 250

## 2023-01-17 SURGICAL SUPPLY — 19 items
BALLN VTRAC 4.5X30X135 (BALLOONS) ×1
BALLOON VTRAC 4.5X30X135 (BALLOONS) IMPLANT
CATH ANGIO 5F PIGTAIL 100CM (CATHETERS) IMPLANT
CATH BEACON 5 .035 100 H1 TIP (CATHETERS) IMPLANT
COVER DRAPE FLUORO 36X44 (DRAPES) IMPLANT
COVER PROBE ULTRASOUND 5X96 (MISCELLANEOUS) IMPLANT
DEVICE EMBOSHIELD NAV6 4.0-7.0 (FILTER) IMPLANT
DEVICE STARCLOSE SE CLOSURE (Vascular Products) IMPLANT
DEVICE TORQUE (MISCELLANEOUS) IMPLANT
GLIDEWIRE ANGLED SS 035X260CM (WIRE) IMPLANT
GUIDEWIRE VASC STIFF .038X260 (WIRE) IMPLANT
KIT CAROTID MANIFOLD (MISCELLANEOUS) IMPLANT
KIT ENCORE 26 ADVANTAGE (KITS) IMPLANT
PACK ANGIOGRAPHY (CUSTOM PROCEDURE TRAY) ×2 IMPLANT
SHEATH BRITE TIP 6FRX11 (SHEATH) IMPLANT
SHEATH SHUTTLE 6FRX80 (SHEATH) IMPLANT
STENT XACT CAR 9-7X40X136 (Permanent Stent) IMPLANT
SYR MEDRAD MARK 7 150ML (SYRINGE) IMPLANT
WIRE GUIDERIGHT .035X150 (WIRE) IMPLANT

## 2023-01-17 NOTE — Progress Notes (Addendum)
PT is c/o chest, chin, shoulder, neck and arm pain. MD made aware. EKG and troponins obtained per verbal order from Dr. Wyn Quaker, EKG viewed by Dr. Wyn Quaker . Discusses pain management medications with pt including this RN"s hesitance to administer IV narcotics d/t BP 102/54. Offered tylenol suppository. PT agrees to wait and see how pain and BP progress.

## 2023-01-17 NOTE — Op Note (Signed)
OPERATIVE NOTE DATE: 01/17/2023  PROCEDURE:  Ultrasound guidance for vascular access right femoral artery  Placement of a 9 mm proximal, 7 mm distal, 4 cm long exact stent with the use of the NAV-6 embolic protection device in the right carotid artery  PRE-OPERATIVE DIAGNOSIS: 1.  High-grade right carotid artery stenosis. 2.  Previous cervical spine fracture  POST-OPERATIVE DIAGNOSIS:  Same as above  SURGEON: Festus Barren, MD  ASSISTANT(S): None  ANESTHESIA: local/MCS  ESTIMATED BLOOD LOSS: 20 cc  CONTRAST: 50 cc  FLUORO TIME: 2.7 minutes  MODERATE CONSCIOUS SEDATION TIME:  Approximately 36 minutes using 2 mg of Versed and 75 mcg of Fentanyl  FINDING(S): 1.   95% right internal carotid artery stenosis  SPECIMEN(S):   none  INDICATIONS:   Patient is a 77 y.o. female who presents with high-grade right carotid artery stenosis.  The patient has a previous cervical spine fracture and carotid artery stenting was felt to be preferred to endarterectomy for that reason. I have completed Share Decision Making with Hollice Gong V. prior to surgery.  Conversations included: -Discussion of all treatment options including carotid endarterectomy (CEA), CAS (which includes transcarotid artery revascularization (TCAR)), and optimal medical therapy (OMT)). -Explanation of risks and benefits for each option specific to Hollice Gong V.'s clinical situation. -Integration of clinical guidelines as it relates to the patient's history and co-morbidities -Discussion and incorporation of Hollice Gong V. and their personal preferences and priorities in choosing a treatment plan.   Risks and benefits were discussed and informed consent was obtained.   DESCRIPTION: After obtaining full informed written consent, the patient was brought back to the vascular suite and placed supine upon the table.  The patient received IV antibiotics prior to induction. Moderate conscious sedation was administered during a  face to face encounter with the patient throughout the procedure with my supervision of the RN administering medicines and monitoring the patients vital signs and mental status throughout from the start of the procedure until the patient was taken to the recovery room.  After obtaining adequate anesthesia, the patient was prepped and draped in the standard fashion.   The right femoral artery was visualized with ultrasound and found to be widely patent. It was then accessed under direct ultrasound guidance without difficulty with a Seldinger needle. A permanent image was recorded. A J-wire was placed and we then placed a 6 French sheath. The patient was then heparinized and a total of 8000 units of intravenous heparin were given and an ACT was checked to confirm successful anticoagulation. A pigtail catheter was then placed into the ascending aorta. This showed a type II aortic arch with no stenosis of the proximal vessels. I then selectively cannulated the innominate artery without difficulty with a H2 catheter and advanced into the mid right common carotid artery.  Cervical and cerebral carotid angiography was then performed. There were no obvious intracranial filling defects with very minimal anterior cerebral artery filling. The carotid bifurcation demonstrated a very high-grade 95% carotid artery stenosis at the origin of the internal carotid artery.  I then advanced into the external carotid artery with a Glidewire and the H2 catheter and then exchanged for the Amplatz Super Stiff wire. Over the Amplatz Super Stiff wire, a 6 [place] shuttle sheath was placed into the mid common carotid artery. I then used the NAV-6  Embolic protection device and crossed the lesion and parked this in the distal internal carotid artery at the base of the skull.  I then  selected a 9 mm proximal, 7 mm distal, 4 cm long exact stent. This was deployed across the lesion encompassing it in its entirety. A 4.5 mm diameter by 3 cm length  balloon was used to post dilate the stent. Only about a 20% residual stenosis was present after angioplasty. Completion angiogram showed normal intracranial filling without new defects. At this point I elected to terminate the procedure. The sheath was removed and StarClose closure device was deployed in the right femoral artery with excellent hemostatic result. The patient was taken to the recovery room in stable condition having tolerated the procedure well.  COMPLICATIONS: none  CONDITION: stable  Festus Barren 01/17/2023 10:16 AM   This note was created with Dragon Medical transcription system. Any errors in dictation are purely unintentional.

## 2023-01-17 NOTE — Progress Notes (Signed)
Dr. Wyn Quaker notified patient heart has dropped to 39. At this time no new orders. Continue to monitor.

## 2023-01-17 NOTE — Progress Notes (Signed)
Notified Dr. Wyn Quaker patient dopamine 8 mcg/kg/min and patient has an episode of N/V-Zofran ordered. Per Dr. Wyn Quaker switch dopamine to Neo. Continue to assess.

## 2023-01-17 NOTE — Interval H&P Note (Signed)
History and Physical Interval Note:  01/17/2023 8:23 AM  Christine Gong V.  has presented today for surgery, with the diagnosis of R Carotid Stent   ABBOTT   Carotid artery stenosis.  The various methods of treatment have been discussed with the patient and family. After consideration of risks, benefits and other options for treatment, the patient has consented to  Procedure(s): CAROTID PTA/STENT INTERVENTION (Right) as a surgical intervention.  The patient's history has been reviewed, patient examined, no change in status, stable for surgery.  I have reviewed the patient's chart and labs.  Questions were answered to the patient's satisfaction.     Festus Barren

## 2023-01-18 ENCOUNTER — Encounter: Payer: Self-pay | Admitting: Vascular Surgery

## 2023-01-18 LAB — CBC
HCT: 43.7 % (ref 36.0–46.0)
Hemoglobin: 14.4 g/dL (ref 12.0–15.0)
MCH: 31.9 pg (ref 26.0–34.0)
MCHC: 33 g/dL (ref 30.0–36.0)
MCV: 96.9 fL (ref 80.0–100.0)
Platelets: 241 10*3/uL (ref 150–400)
RBC: 4.51 MIL/uL (ref 3.87–5.11)
RDW: 12.2 % (ref 11.5–15.5)
WBC: 10.5 10*3/uL (ref 4.0–10.5)
nRBC: 0 % (ref 0.0–0.2)

## 2023-01-18 LAB — BASIC METABOLIC PANEL
Anion gap: 7 (ref 5–15)
BUN: 14 mg/dL (ref 8–23)
CO2: 27 mmol/L (ref 22–32)
Calcium: 8.9 mg/dL (ref 8.9–10.3)
Chloride: 105 mmol/L (ref 98–111)
Creatinine, Ser: 0.57 mg/dL (ref 0.44–1.00)
GFR, Estimated: >60 mL/min (ref >60)
Glucose, Bld: 213 mg/dL — ABNORMAL HIGH (ref 70–99)
Potassium: 3.7 mmol/L (ref 3.5–5.1)
Sodium: 139 mmol/L (ref 135–145)

## 2023-01-18 MED ORDER — PROMETHAZINE HCL 25 MG PO TABS: 25.0000 mg | ORAL_TABLET | Freq: Four times a day (QID) | ORAL | Status: DC | PRN

## 2023-01-18 MED ORDER — SODIUM CHLORIDE 0.9 % IV SOLN
12.5000 mg | Freq: Once | INTRAVENOUS | Status: AC
  Administered 2023-01-18: 12.5 mg via INTRAVENOUS
  Filled 2023-01-18: qty 12.5

## 2023-01-18 MED ORDER — ORAL CARE MOUTH RINSE: 15.0000 mL | OROMUCOSAL | Status: DC | PRN

## 2023-01-18 NOTE — TOC Initial Note (Signed)
Transition of Care [facility]) - Initial/Assessment Note    Patient Details  Name: Christine Richards MRN: [id number] Date of Birth: 09-Oct-1945  Transition of Care [facility]) CM/SW Contact:    Darolyn Rua, LCSW Phone Number: 01/18/2023, 1:51 PM  Clinical Narrative:                  CSW met with patient at bedside, confirmed no needs for discharge. Reports having family support. PCP is Merita Norton, patient off O2 in room and eating ice cream. Reports no questions or concerns at this time.    Barriers to Discharge: Continued Medical Work up   Patient Goals and CMS Choice Patient states their goals for this hospitalization and ongoing recovery are:: to go home CMS Medicare.gov Compare Post Acute Care list provided to:: Patient   [place] ownership interest in [facility].provided to:: Patient    Expected Discharge Plan and Services                                              Prior Living Arrangements/Services                       Activities of Daily Living Home Assistive Devices/Equipment: CBG Meter, Hearing aid, Other (Comment), CPAP ADL Screening (condition at time of admission) Patient's cognitive ability adequate to safely complete daily activities?: Yes Is the patient deaf or have difficulty hearing?: No Does the patient have difficulty seeing, even when wearing glasses/contacts?: No Does the patient have difficulty concentrating, remembering, or making decisions?: No Patient able to express need for assistance with ADLs?: Yes Does the patient have difficulty dressing or bathing?: No Independently performs ADLs?: Yes (appropriate for developmental age) Does the patient have difficulty walking or climbing stairs?: No Weakness of Legs: None Weakness of Arms/Hands: None  Permission Sought/Granted                  Emotional Assessment              Admission diagnosis:  Carotid stenosis, right [I65.21] Patient Active Problem List    Diagnosis Date Noted   Carotid stenosis, right 01/17/2023   OSA on CPAP 11/29/2022   Annual physical exam 06/16/2022   Vaginal pessary in situ 06/16/2022   Acute left-sided low back pain with left-sided sciatica 03/16/2022   Incontinence in female 02/12/2022   Dyspnea on exertion 02/12/2022   Female genital prolapse 12/16/2021   Type 2 diabetes mellitus with hyperglycemia, without long-term current use of insulin (HCC) 12/16/2021   Tinnitus, bilateral 11/04/2021   Bilateral sensorineural hearing loss 11/04/2021   Bilateral hearing loss 11/03/2021   Bilateral impacted cerumen 11/03/2021   Vitamin B12 deficiency 06/18/2021   Need for shingles vaccine 06/18/2021   Dyspepsia 06/18/2021   Carotid stenosis 12/25/2014   Fear of flying 12/25/2014   Avitaminosis D 12/25/2014   Hyperlipidemia associated with type 2 diabetes mellitus (HCC) 02/20/2007   PCP:  Jacky Kindle, FNP Pharmacy:   [facility] [id number] - [place], [place] - [address] [place] [address] Phone: [phone] Fax: [phone]  [facility] - [place], [place] - [address] [place] [address] Phone: [phone] Fax: [phone]  [id number] - [place],  - [address] [place] [address] Phone: [phone] Fax: [phone]  [facility] - [place], [place] - [address] [place] [address] Phone: [phone] Fax: [phone]     Social Determinants of Health (SDOH) Social History: SDOH Screenings   Food Insecurity: No Food Insecurity (01/17/2023)  Housing: Low Risk  (01/17/2023)  Transportation Needs: No Transportation Needs (01/17/2023)  Utilities: Not At Risk (01/17/2023)  Alcohol Screen: Low Risk  (11/29/2022)  Depression (PHQ2-9): Low Risk  (11/29/2022)  Financial Resource Strain: Low Risk  (11/02/2021)   Physical Activity: Sufficiently Active (09/10/2021)  Social Connections: Moderately Isolated (09/13/2022)  Stress: No Stress Concern Present (09/13/2022)  Tobacco Use: Low Risk  (01/18/2023)   SDOH Interventions:     Readmission Risk Interventions     No data to display

## 2023-01-18 NOTE — Progress Notes (Signed)
[facility]  Daily Progress Note   Subjective  -   Lots of nausea last night.  Still unable to eat On 10 of Dopamine. BP 129/50 currently   Objective Vitals:   01/18/23 0817 01/18/23 0830 01/18/23 0835 01/18/23 0840  BP: (!) 127/49 (!) 78/43 (!) 89/44 (!) 100/44  Pulse: 60 (!) 53 (!) 49 (!) 51  Resp: 19 14 14 16   Temp: 97.7 F (36.5 C)     TempSrc: Axillary     SpO2: 96% 94% 95% 96%  Weight:      Height:        Intake/Output Summary (Last 24 hours) at 01/18/2023 0851 Last data filed at 01/18/2023 0700 Gross per 24 hour  Intake 3594.3 ml  Output 1350 ml  Net 2244.3 ml    PULM  CTAB CV  RRR VASC  Access site C/D/I Neuro exam is normal  Laboratory CBC    Component Value Date/Time   WBC 10.5 01/18/2023 0350   HGB 14.4 01/18/2023 0350   HGB 13.5 11/29/2022 1502   HCT 43.7 01/18/2023 0350   HCT 40.6 11/29/2022 1502   PLT 241 01/18/2023 0350   PLT 241 11/29/2022 1502    BMET    Component Value Date/Time   NA 139 01/18/2023 0350   NA 142 11/29/2022 1502   NA 138 03/17/2013 1443   K 3.7 01/18/2023 0350   K 3.6 03/17/2013 1443   CL 105 01/18/2023 0350   CL 105 03/17/2013 1443   CO2 27 01/18/2023 0350   CO2 30 03/17/2013 1443   GLUCOSE 213 (H) 01/18/2023 0350   GLUCOSE 192 (H) 03/17/2013 1443   BUN 14 01/18/2023 0350   BUN 18 11/29/2022 1502   BUN 16 03/17/2013 1443   CREATININE 0.57 01/18/2023 0350   CREATININE 0.72 03/17/2013 1443   CALCIUM 8.9 01/18/2023 0350   CALCIUM 9.1 03/17/2013 1443   GFRNONAA >60 01/18/2023 0350   GFRNONAA >60 03/17/2013 1443   GFRAA 101 04/18/2020 1505   GFRAA >60 03/17/2013 1443    Assessment/Planning: POD #1 s/p right carotid stent for high grade stenosis  Still on dopamine.  Will try to wean off today Saline lock iv Increase activity Maybe home later today and tomorrow    Festus Barren  01/18/2023, 8:51 AM

## 2023-01-19 ENCOUNTER — Inpatient Hospital Stay: Payer: Medicare HMO

## 2023-01-19 ENCOUNTER — Encounter: Payer: Self-pay | Admitting: Vascular Surgery

## 2023-01-19 ENCOUNTER — Inpatient Hospital Stay (HOSPITAL_COMMUNITY)
Admission: RE | Admit: 2023-01-19 | Discharge: 2023-01-19 | Disposition: A | Discharge status: Home / Self Care | Payer: Medicare HMO | Source: Home / Self Care | Attending: Nurse Practitioner | Admitting: Nurse Practitioner

## 2023-01-19 DIAGNOSIS — I2489 Other forms of acute ischemic heart disease: Secondary | ICD-10-CM

## 2023-01-19 DIAGNOSIS — I4891 Unspecified atrial fibrillation: Secondary | ICD-10-CM

## 2023-01-19 DIAGNOSIS — I6521 Occlusion and stenosis of right carotid artery: Secondary | ICD-10-CM

## 2023-01-19 LAB — ECHOCARDIOGRAM COMPLETE
AR max vel: 2.21 cm2
AV Area VTI: 2.53 cm2
AV Area mean vel: 2.19 cm2
AV Mean grad: 5 mmHg
AV Peak grad: 8.1 mmHg
Ao pk vel: 1.42 m/s
Area-P 1/2: 2.45 cm2
Height: 65 in
MV VTI: 2.29 cm2
S' Lateral: 2.5 cm
Weight: 2409.19 oz

## 2023-01-19 LAB — TROPONIN I (HIGH SENSITIVITY)
Troponin I (High Sensitivity): 6 ng/L (ref <18)
Troponin I (High Sensitivity): 15 ng/L (ref <18)
Troponin I (High Sensitivity): 51 ng/L — ABNORMAL HIGH (ref <18)
Troponin I (High Sensitivity): 58 ng/L — ABNORMAL HIGH (ref <18)
Troponin I (High Sensitivity): 55 ng/L — ABNORMAL HIGH (ref <18)

## 2023-01-19 LAB — GLUCOSE, CAPILLARY
Glucose-Capillary: 142 mg/dL — ABNORMAL HIGH (ref 70–99)
Glucose-Capillary: 170 mg/dL — ABNORMAL HIGH (ref 70–99)
Glucose-Capillary: 127 mg/dL — ABNORMAL HIGH (ref 70–99)
Glucose-Capillary: 133 mg/dL — ABNORMAL HIGH (ref 70–99)

## 2023-01-19 LAB — TSH: TSH: 4.516 u[IU]/mL — ABNORMAL HIGH (ref 0.350–4.500)

## 2023-01-19 LAB — T4, FREE: Free T4: 0.64 ng/dL (ref 0.61–1.12)

## 2023-01-19 MED ORDER — SODIUM CHLORIDE 0.9 % IV BOLUS: 1000.0000 mL | Freq: Once | INTRAVENOUS | Status: DC

## 2023-01-19 MED ORDER — AMIODARONE IV BOLUS ONLY 150 MG/100ML
INTRAVENOUS | Status: AC
  Filled 2023-01-19: qty 100

## 2023-01-19 MED ORDER — AMIODARONE LOAD VIA INFUSION: 150.0000 mg | Freq: Once | INTRAVENOUS | Status: DC

## 2023-01-19 MED ORDER — INSULIN ASPART 100 UNIT/ML IJ SOLN: 0.0000 [IU] | Freq: Every day | INTRAMUSCULAR | Status: DC

## 2023-01-19 MED ORDER — INSULIN ASPART 100 UNIT/ML IJ SOLN
3.0000 [IU] | Freq: Three times a day (TID) | INTRAMUSCULAR | Status: DC
  Filled 2023-01-19: qty 1

## 2023-01-19 MED ORDER — INSULIN ASPART 100 UNIT/ML IJ SOLN
0.0000 [IU] | Freq: Three times a day (TID) | INTRAMUSCULAR | Status: DC
  Filled 2023-01-19: qty 1

## 2023-01-19 MED ORDER — NITROGLYCERIN 0.4 MG SL SUBL
0.4000 mg | SUBLINGUAL_TABLET | SUBLINGUAL | Status: DC | PRN
  Filled 2023-01-19: qty 1

## 2023-01-19 MED ORDER — AMIODARONE HCL IN DEXTROSE 360-4.14 MG/200ML-% IV SOLN
60.0000 mg/h | INTRAVENOUS | Status: DC
  Administered 2023-01-19 (×2): 60 mg/h via INTRAVENOUS
  Filled 2023-01-19 (×2): qty 200

## 2023-01-19 NOTE — Progress Notes (Signed)
Called son Cletis Athens back this morning.  Told him about lopressor, IVF bolus and Amiodarone drip being started.  They family is concerned and requesting that a ECHO be obtained.  Since the patient hasn't had one done.

## 2023-01-19 NOTE — Progress Notes (Signed)
PT Cancellation Note  Patient Details Name: Christine Richards MRN: [id number] DOB: 04-10-1946   Cancelled Treatment:    Reason Eval/Treat Not Completed: PT screened, no needs identified, will sign off (Pt demonstrated/reported return to baseline functioning, no PT needs at this time, PT to sign off.)  Olga Coaster PT, DPT 2:43 PM,01/19/23

## 2023-01-19 NOTE — Progress Notes (Signed)
Patient son Christine Richards was contacted at the following number [phone] and updated on mom's condition.  Will call him back around 0600 and he should be here around 0730.  He was able to talk his mom over the phone.   Patient had an episode where her HR was greater than 150's and going into A-fib with RVR after the 12 lead EKG confirmed it.  Patient started complaining of chest pain Dobutamine was stopped which was unrelieved by Morphine IVP or Nitro tablets SL.  The Vascular and PCCM doctors were contacted at the begin of the process with PCCM coming to the room.    When patient started complaining of chest pain radiating upward to jaw pain, PADs were placed in case of code happening.  Lopressor IVP and NS bolus was given with some relief per the patient.  Will hold off on Amiodarone bolus at this time and continue to monitor.

## 2023-01-19 NOTE — Progress Notes (Signed)
[facility]  Daily Progress Note   Subjective  -   Rapid atrial fibrillation overnight.  This was associated with chest pain.  She is converted back to sinus rhythm and her blood pressure is normal at this time.  No current chest pain.  Cardiology has seen the patient.  Objective Vitals:   01/19/23 0500 01/19/23 0600 01/19/23 0740 01/19/23 0800  BP: (!) 91/55 (!) 91/57 (!) 101/54 112/65  Pulse: (!) 106 73 (!) 43 73  Resp: 20 15 18 19   Temp:      TempSrc:      SpO2: 94% 96% 95% 97%  Weight:      Height:        Intake/Output Summary (Last 24 hours) at 01/19/2023 1014 Last data filed at 01/19/2023 0807 Gross per 24 hour  Intake 768.05 ml  Output 600 ml  Net 168.05 ml    PULM  CTAB CV  RRR VASC  feet are warm.  Access site is without hematoma.  Laboratory CBC    Component Value Date/Time   WBC 10.5 01/18/2023 0350   HGB 14.4 01/18/2023 0350   HGB 13.5 11/29/2022 1502   HCT 43.7 01/18/2023 0350   HCT 40.6 11/29/2022 1502   PLT 241 01/18/2023 0350   PLT 241 11/29/2022 1502    BMET    Component Value Date/Time   NA 139 01/18/2023 0350   NA 142 11/29/2022 1502   NA 138 03/17/2013 1443   K 3.7 01/18/2023 0350   K 3.6 03/17/2013 1443   CL 105 01/18/2023 0350   CL 105 03/17/2013 1443   CO2 27 01/18/2023 0350   CO2 30 03/17/2013 1443   GLUCOSE 213 (H) 01/18/2023 0350   GLUCOSE 192 (H) 03/17/2013 1443   BUN 14 01/18/2023 0350   BUN 18 11/29/2022 1502   BUN 16 03/17/2013 1443   CREATININE 0.57 01/18/2023 0350   CREATININE 0.72 03/17/2013 1443   CALCIUM 8.9 01/18/2023 0350   CALCIUM 9.1 03/17/2013 1443   GFRNONAA >60 01/18/2023 0350   GFRNONAA >60 03/17/2013 1443   GFRAA 101 04/18/2020 1505   GFRAA >60 03/17/2013 1443    Assessment/Planning: POD #2 s/p right carotid stent for high-grade stenosis  Neurologically intact Biggest issue has been heart rate and blood pressure.  Had hypotension and bradycardia and was on dopamine but  developed rapid atrial fibrillation overnight Appreciate cardiology input Currently in sinus rhythm and having no pain Blood pressure has stabilized and not currently requiring any augmentation We will monitor today and if stays in sinus rhythm with normal blood pressure will likely go home tomorrow   Festus Barren  01/19/2023, 10:14 AM

## 2023-01-19 NOTE — Consult Note (Addendum)
NAME:  Christine Richards, Christine Richards MRN:  [id number], DOB:  June 06, 1946, LOS: 2 ADMISSION DATE:  01/17/2023, CONSULTATION DATE:  01/19/23 REFERRING MD: Festus Barren REASON FOR CONSULT:  Atrial Fibrillation, Chest pain   HPI  77 y.o  female with significant PMH of OSA on CPAP, Bilateral carotid stenosis. TIA, HLD, DM and arthritis  who presented to the hospital for elective right carotid stent. Pertinent Labs pre-op  unremarkable otherthan slightly elevated blood glucose levels.Patient underwent right carotid stent on 5/20 and was transfer to the ICU post op for monitoring.  See significant events below  Past Medical History  OSA on CPAP, Bilateral carotid stenosis. TIA, HLD, DM and arthritis  Significant Hospital Events   5/20: S/p elective Right carotid stent placement.  Transferred to ICU for close monitoring. 5/21: Pt c/o chest pain radiating to chin, shoulder neck and arm.  EKG and troponin unremarkable.  Started on Dopamine for symptomatic bradycardia 5/22: Developed new onset Afib w/RVR, and chest pain radiating to jaw area.  PCCM consulted.  Consults:  PCCM  Procedures:  NONE  Significant Diagnostic Tests:  5/22: Chest Xray> no active acute cardiopulmonary process   Interim History / Subjective:  -  Micro Data:  None   Antimicrobials:  none  OBJECTIVE  Blood pressure (!) 98/56, pulse 96, temperature (!) 97.5 F (36.4 C), temperature source Oral, resp. rate 17, height 5\' 5"  (1.651 m), weight 68.3 kg, SpO2 95 %.        Intake/Output Summary (Last 24 hours) at 01/19/2023 0525 Last data filed at 01/19/2023 0320 Gross per 24 hour  Intake 2156.95 ml  Output 830 ml  Net 1326.95 ml   Filed Weights   01/17/23 0854 01/17/23 1239  Weight: 68 kg 68.3 kg   Physical Examination  GENERAL: 77 year-old critically ill patient lying in the bed in no acute distress EYES: PEERLA. No scleral icterus. Extraocular muscles intact.  HEENT: Head atraumatic, normocephalic. Oropharynx and  nasopharynx clear.  NECK:  No JVD, supple  LUNGS: Normal breath sounds bilaterally.  No use of accessory muscles of respiration.  CARDIOVASCULAR: S1, S2 normal. No murmurs, rubs, or gallops.  ABDOMEN: Soft, NTND EXTREMITIES: No swelling or erythema.  Moves all extremities spontaneously.  Capillary refill is> 3 seconds in all extremities. Pulses palpable distally. NEUROLOGIC: The patient is alert oriented x 4.  No focal neurological deficit cranial nerves are intact.  SKIN: No obvious rash, lesion, or ulcer. Warm to touch Labs/imaging that I havepersonally reviewed  (right click and "Reselect all SmartList Selections" daily)     Labs   CBC: Recent Labs  Lab 01/18/23 0350  WBC 10.5  HGB 14.4  HCT 43.7  MCV 96.9  PLT 241    Basic Metabolic Panel: Recent Labs  Lab 01/17/23 0840 01/18/23 0350  NA  --  139  K  --  3.7  CL  --  105  CO2  --  27  GLUCOSE  --  213*  BUN 19 14  CREATININE 0.60 0.57  CALCIUM  --  8.9   GFR: Estimated Creatinine Clearance: 53 mL/min (by C-G formula based on SCr of 0.57 mg/dL). Recent Labs  Lab 01/18/23 0350  WBC 10.5    Liver Function Tests: No results for input(s): "AST", "ALT", "ALKPHOS", "BILITOT", "PROT", "ALBUMIN" in the last 168 hours. No results for input(s): "LIPASE", "AMYLASE" in the last 168 hours. No results for input(s): "AMMONIA" in the last 168 hours.  ABG No results found for: "PHART", "PCO2ART", "PO2ART", "  HCO3", "TCO2", "ACIDBASEDEF", "O2SAT"   Coagulation Profile: No results for input(s): "INR", "PROTIME" in the last 168 hours.  Cardiac Enzymes: No results for input(s): "CKTOTAL", "CKMB", "CKMBINDEX", "TROPONINI" in the last 168 hours.  HbA1C: Hgb A1c MFr Bld  Date/Time Value Ref Range Status  11/29/2022 03:02 PM 7.6 (H) 4.8 - 5.6 % Final    Comment:             Prediabetes: 5.7 - 6.4          Diabetes: >6.4          Glycemic control for adults with diabetes: <7.0   06/16/2022 03:23 PM 7.9 (H) 4.8 - 5.6 %  Final    Comment:             Prediabetes: 5.7 - 6.4          Diabetes: >6.4          Glycemic control for adults with diabetes: <7.0     CBG: Recent Labs  Lab 01/17/23 0903  GLUCAP 155*    Review of Systems:   Review of Systems  Constitutional: Negative.   HENT: Negative.    Eyes: Negative.   Respiratory: Negative.    Cardiovascular:  Positive for chest pain.  Gastrointestinal:  Positive for nausea.  Genitourinary: Negative.   Musculoskeletal:  Positive for neck pain.  Skin: Negative.   Neurological: Negative.   Endo/Heme/Allergies: Negative.   Psychiatric/Behavioral: Negative.     Past Medical History  She,  has a past medical history of Arthritis, Diabetes mellitus without complication (HCC), Diverticulosis, Environmental and seasonal allergies, Hyperlipidemia, Osteopenia, Sciatica (2024), Sinus congestion, Sleep apnea, Stroke (HCC), and TIA (transient ischemic attack).   Surgical History    Past Surgical History:  Procedure Laterality Date   BREAST BIOPSY Right 2010   benign   BREAST CYST ASPIRATION Right    BREAST SURGERY Right 2010   biopsy   CAROTID PTA/STENT INTERVENTION Right 01/17/2023   Procedure: CAROTID PTA/STENT INTERVENTION;  Surgeon: Annice Needy, MD;  Location: [facility];  Service: Cardiovascular;  Laterality: Right;   CATARACT EXTRACTION W/PHACO Right 11/11/2016   Procedure: CATARACT EXTRACTION PHACO AND INTRAOCULAR LENS PLACEMENT (IOC);  Surgeon: Nevada Crane, MD;  Location: [facility];  Service: Ophthalmology;  Laterality: Right;  [place] 1:04.9 AP% 14.6 CDE 9.64 Fluid pack lot # [id number]   CATARACT EXTRACTION W/PHACO Left 09/26/2019   Procedure: CATARACT EXTRACTION PHACO AND INTRAOCULAR LENS PLACEMENT (IOC) LEFT DIABETIC SYMFONY LENS 12.82 01:22.2 15.6%;  Surgeon: Lockie Mola, MD;  Location: [facility];  Service: Ophthalmology;  Laterality: Left;   ESOPHAGOGASTRODUODENOSCOPY (EGD) WITH PROPOFOL N/A 12/29/2015   Procedure:  ESOPHAGOGASTRODUODENOSCOPY (EGD) WITH PROPOFOL;  Surgeon: Midge Minium, MD;  Location: [facility];  Service: Endoscopy;  Laterality: N/A;  GASTRI BIOPSY   TUBAL LIGATION       Social History   reports that she has never smoked. She has never used smokeless tobacco. She reports that she does not drink alcohol and does not use drugs.   Family History   Her family history includes Atrial fibrillation in her sister; Congestive Heart Failure in her mother; Dementia in her father; Heart attack in her father and mother; Heart disease in her brother; Hyperlipidemia in her sister; Prostate cancer in her father. There is no history of Breast cancer.   Allergies Allergies  Allergen Reactions   Antihistamines, Chlorpheniramine-Type     Facial swelling    Macrobid [Nitrofurantoin Macrocrystal]  unknown   Statins     Muscle Pain, Weakness      Home Medications  Prior to Admission medications   Medication Sig Start Date End Date Taking? Authorizing Provider  acetaminophen (TYLENOL) 160 MG/5ML elixir Take by mouth every 4 (four) hours as needed for fever.   Yes [provider]  ALPHA LIPOIC ACID PO Take by mouth.   Yes [provider]  ALPRAZolam (XANAX) 0.5 MG tablet Take 1 tablet (0.5 mg total) by mouth 2 (two) times daily as needed for anxiety. 11/29/22  Yes Merita Norton T, FNP  Berberine Chloride 500 MG CAPS  03/25/22  Yes [provider]  BIOTIN PO Take by mouth.   Yes [provider]  Cholecalciferol (VITAMIN D) 2000 UNITS CAPS Take 4,000 Units by mouth daily. 07/05/14  Yes [provider]  clopidogrel (PLAVIX) 75 MG tablet Take 1 tablet (75 mg total) by mouth daily. 01/04/23  Yes Dew, Marlow Baars, MD  Cyanocobalamin (VITAMIN B-12) 1000 MCG SUBL Place 1 tablet (1,000 mcg total) under the tongue daily. 05/26/21  Yes Bacigalupo, Marzella Schlein, MD  estradiol (ESTRACE) 0.1 MG/GM vaginal cream Place 1 Applicatorful vaginally at bedtime.   Yes [provider]  Omega-3 Fatty Acids (FISH OIL) 1000 MG CAPS Take 1,000-2,000 mg by mouth at bedtime.  12/02/06  Yes [provider]  blood glucose meter kit and supplies Dispense based on patient and insurance preference. To check sugar once daily. 04/21/22   Jacky Kindle, FNP  Blood Glucose Monitoring Suppl DEVI 1 each by Does not apply route in the morning, at noon, and at bedtime. May substitute to any manufacturer covered by patient's insurance. 11/29/22   Jacky Kindle, FNP  Glucose Blood (BLOOD GLUCOSE TEST STRIPS) STRP 1 each by In Vitro route daily. May substitute to any manufacturer covered by patient's insurance. 11/29/22 04/12/24  Jacky Kindle, FNP  glucose blood (TRUE METRIX BLOOD GLUCOSE TEST) test strip TEST BLOOD SUGAR ONE TIME DAILY 11/23/22   Jacky Kindle, FNP  meloxicam (MOBIC) 15 MG tablet Take 15 mg by mouth daily. Patient not taking: Reported on 01/17/2023 11/24/22   [provider]  TRUEplus Lancets 33G MISC  08/01/22   [provider]  Scheduled Meds:  aspirin EC  81 mg Oral Q0600   Chlorhexidine Gluconate Cloth  6 each Topical Daily   cholecalciferol  4,000 Units Oral Daily   clopidogrel  75 mg Oral Daily   cyanocobalamin  1,000 mcg Oral Daily   estradiol  1 Applicatorful Vaginal QHS   omega-3 acid ethyl esters  2 g Oral Daily   Continuous Infusions:  sodium chloride Stopped (01/18/23 1200)   sodium chloride     amiodarone 60 mg/hr (01/19/23 0320)   amiodarone     DOPamine Stopped (01/19/23 0056)   famotidine (PEPCID) IV Stopped (01/19/23 0020)   magnesium sulfate bolus IVPB     phenylephrine (NEO-SYNEPHRINE) Adult infusion Stopped (01/18/23 0032)   sodium chloride     PRN Meds:.acetaminophen **OR** acetaminophen, ALPRAZolam, alum & mag hydroxide-simeth, amiodarone, guaiFENesin-dextromethorphan, hydrALAZINE, HYDROmorphone (DILAUDID) injection, labetalol, magnesium sulfate bolus IVPB, metoprolol tartrate, morphine injection, nitroGLYCERIN,  ondansetron (ZOFRAN) IV, mouth rinse, oxyCODONE-acetaminophen, phenol, potassium chloride, promethazine   Active Hospital Problem list   See below  Assessment & Plan:  #New Onset Atrial Fibrillation with RVR likely in the setting of Dopamine initiation Hx of OSA on CPAP -Stop Dopamine infusion -Serial EKGs -Trend Troponins -TSH, FT4 -TTEcho -Start Amiodarone 150 mg  IV bolus, then 1mg /min x6 hrs, then 0.5mg /min for 18 hours  -Keep NPO   -Cardiology Consult if appropriate  #Symptomatic Sinus Bradycardia s/p carotid stent plaement HR in the low 30's, hypotension  -Started on dopamine 5-20 mcg/kg/min  but patient developed atrial fibrillation w/rvr and was stopped -Check TSH, Free T4 as above -Monitor BMP+Mag -Hold AV nodal drugs -Cardiology consult  #Atypical Chest -ST elevation in more than 1 lead otherwise No dynamic EKG changes with normal cardiac enzymes -trend troponins -ASA 81mg  PO daily  -Atorvastatin 80mg  PO daily, check lipid panel  -NTG + morphine PRN chest pain  -Hold Anti-coagulation (Heparin drip per ACS protocol) as symptoms not convincing for NSTEMI  #Bilateral Carotid Stenosis Hx of previous cervical spine fracture S/p right  carotid stent placement -Management per vascular  #Diabetes Mellitus -CBG's q4; Target range of 140 to 180 -SSI -Follow ICU Hypo/Hyperglycemia protocol   #OSA on CPAP -Continue CPAP  Best practice:  Diet:  NPO Pain/Anxiety/Delirium protocol (if indicated): No VAP protocol (if indicated): Not indicated DVT prophylaxis: LMWH GI prophylaxis: N/A Glucose control:  SSI Yes Central venous access:  N/A Arterial line:  N/A Foley:  N/A Mobility:  bed rest  PT consulted: N/A Last date of multidisciplinary goals of care discussion [5/22] Code Status:  full code Disposition: ICU   = Goals of Care = Code Status Order: FULL  Primary Emergency Contact: Wynia,Jon Wishes to pursue full aggressive treatment and intervention options,  including CPR and intubation, but goals of care will be addressed on going with family if that should become necessary.  Critical care time: 45 minutes        Webb Silversmith DNP, CCRN, FNP-C, AGACNP-BC Acute Care & Family Nurse Practitioner [place] [facility] PCCM on call pager [phone]

## 2023-01-19 NOTE — Hospital Course (Signed)
00:40 Afib 128, lvh

## 2023-01-19 NOTE — Progress Notes (Signed)
   BRIEF PCCM NOTE  Pt was seen earlier by PCCM provider Webb Silversmith, NP.  Please see her Consult note with attestation by Dr. Belia Heman for full assessment & plan.  BRIEF PT DESCRIPTION / SYNOPSIS :  77 y.o. female admitted following elective right carotid stent placement.  On POD Day 1 she developed symptomatic bradycardia requiring Dopamine drip.  Hospital course complicated by development of Atrial Fibrillation with RVR with associated chest/jaw pain requiring Amiodarone.  SUBJECTIVE / INTERVAL HISTORY :  -Earlier this morning developed A. Fib w/ RVR (suspect due to Dopamine) ~ received Amio bolus and gtt ~ has since converted back to NSR/SB -Pt currently denies all complaints ~ NO Chest pain, SOB, dizziness, abdominal pain/N/V -Echocardiogram is pending -Will consult Cardiology to assist with workup of A-fib and chest pain   OBJECTIVE :   Today's Vitals   01/19/23 0500 01/19/23 0600 01/19/23 0740 01/19/23 0800  BP: (!) 91/55 (!) 91/57 (!) 101/54 112/65  Pulse: (!) 106 73 (!) 43 73  Resp: 20 15 18 19   Temp:      TempSrc:      SpO2: 94% 96% 95% 97%  Weight:      Height:      PainSc:       Body mass index is 25.06 kg/m.   ASSESSMENT / PLAN :   #New onset Atrial Fibrillation w/ RVR ~ RESOLVED #Chest pain with radiation to jaw ~ RESOLVED #Symptomatic Bradycardia due to recent Carotid Stent placement PMHx: HLD -Continuous cardiac monitoring -Maintain MAP >65 -IV fluids -Vasopressors as needed to maintain MAP goal ~ currently not requiring -HS Troponin negative x4 (3 ~ 4 ~ 6 ~ 15 ~ ) -Echocardiogram pending -TSH elevated at 4.5, but free T4 WNL at 0.64 -Consult Cardiology, appreciate input ~ recommends holding off on oral AC given short duration of A. fib       5/22: Pt and her son updated at bedside.  All questions answered.  Additional Critical Care Time: 20 minutes  Harlon Ditty, AGACNP-BC  Pulmonary & Critical Care Prefer epic messenger for cross  cover needs If after hours, please call E-link

## 2023-01-19 NOTE — Progress Notes (Signed)
*  PRELIMINARY RESULTS* Echocardiogram 2D Echocardiogram has been performed.  Cristela Blue 01/19/2023, 10:53 AM

## 2023-01-19 NOTE — Consult Note (Signed)
Cardiology Consult    Patient ID: Christine Richards MRN: [id number], DOB/AGE: 03-30-1946   Admit date: 01/17/2023 Date of Consult: 01/19/2023  Primary Physician: Jacky Kindle, FNP Primary Cardiologist: Yvonne Kendall, MD - new Requesting Provider: Festus Barren, MD  Patient Profile    Christine Mcbroom. is a 77 y.o. female with a history of DM, HL, TIA, OSA, and carotid disease s/p R carotid stenting, who is being seen today for the evaluation of bradycardia requiring dopamine followed by development of Afib w/ RVR and chest/jaw pain at the request of Dr. Wyn Quaker.  Past Medical History   Past Medical History:  Diagnosis Date   Arthritis    rheumatoid   Carotid arterial disease (HCC)    Diabetes mellitus without complication (HCC)    Diverticulosis    Environmental and seasonal allergies    Hyperlipidemia    Osteopenia    Sciatica 2024   Sinus congestion    Sleep apnea    uses CPAP   TIA (transient ischemic attack)     Past Surgical History:  Procedure Laterality Date   BREAST BIOPSY Right 2010   benign   BREAST CYST ASPIRATION Right    BREAST SURGERY Right 2010   biopsy   CAROTID PTA/STENT INTERVENTION Right 01/17/2023   Procedure: CAROTID PTA/STENT INTERVENTION;  Surgeon: Annice Needy, MD;  Location: [facility];  Service: Cardiovascular;  Laterality: Right;   CATARACT EXTRACTION W/PHACO Right 11/11/2016   Procedure: CATARACT EXTRACTION PHACO AND INTRAOCULAR LENS PLACEMENT (IOC);  Surgeon: Nevada Crane, MD;  Location: [facility];  Service: Ophthalmology;  Laterality: Right;  [place] 1:04.9 AP% 14.6 CDE 9.64 Fluid pack lot # [id number]   CATARACT EXTRACTION W/PHACO Left 09/26/2019   Procedure: CATARACT EXTRACTION PHACO AND INTRAOCULAR LENS PLACEMENT (IOC) LEFT DIABETIC SYMFONY LENS 12.82 01:22.2 15.6%;  Surgeon: Lockie Mola, MD;  Location: [facility];  Service: Ophthalmology;  Laterality: Left;   ESOPHAGOGASTRODUODENOSCOPY (EGD) WITH PROPOFOL N/A 12/29/2015    Procedure: ESOPHAGOGASTRODUODENOSCOPY (EGD) WITH PROPOFOL;  Surgeon: Midge Minium, MD;  Location: [facility];  Service: Endoscopy;  Laterality: N/A;  GASTRI BIOPSY   TUBAL LIGATION       Allergies  Allergies  Allergen Reactions   Antihistamines, Chlorpheniramine-Type     Facial swelling    Macrobid [Nitrofurantoin Macrocrystal]     unknown   Statins     Muscle Pain, Weakness     History of Present Illness    Christine Richards is a 77 y.o. female with a history of DM, HL, TIA, OSA, and carotid disease.  She has no prior cardiac history.  She is very active, exercising 5 days/wk w/o symptoms or limitations.  She suffered a TIA ~ 10 yrs ago, and has been followed by vascular surgery in the setting of moderate R>L carotid disease.  She recently underwent f/u CTA of the carotids which showed significant progression of the R carotid dzs w/ a radiologic string sign.  As a result, she underwent R carotid stenting on 5/20.  Post-op, she developed sinus bradycardia associated with nausea, w/ rates in the 30's, requiring initiation of dopamine.  At 1221 this morning, she developed atrial fibrillation with rapid ventricular response with subsequent ECG showing A-fib 128 bpm.  She complained of chest pain radiating to her jaw associated with dyspnea requiring BiPAP.  Troponins were sent off and to date, have been normal.  Dopamine was discontinued in the setting of rapid A-fib.  A follow-up ECG at 141 this  morning continue to show atrial fibrillation at 146 bpm with a left axis deviation, inferior infarct, and inferior ST segment elevation.  Patient notes that chest pain more or less resolved by 2 AM (she received 4 mg of morphine prior to this).  She was placed on amiodarone just after 3 AM and converted to sinus rhythm at 829 this morning with rates currently in the 50s.  Currently she has no complaints.  She notes that she was fairly shaken over the events overnight and has some concerns about not  having received some of her home medications and vitamins.  Inpatient Medications     aspirin EC  81 mg Oral Q0600   Chlorhexidine Gluconate Cloth  6 each Topical Daily   cholecalciferol  4,000 Units Oral Daily   clopidogrel  75 mg Oral Daily   cyanocobalamin  1,000 mcg Oral Daily   estradiol  1 Applicatorful Vaginal QHS   insulin aspart  0-15 Units Subcutaneous TID WC   insulin aspart  0-5 Units Subcutaneous QHS   insulin aspart  3 Units Subcutaneous TID WC   omega-3 acid ethyl esters  2 g Oral Daily    Family History    Family History  Problem Relation Age of Onset   Congestive Heart Failure Mother    Heart attack Mother    Dementia Father    Heart attack Father    Prostate cancer Father    Atrial fibrillation Sister    Hyperlipidemia Sister    Heart disease Brother    Breast cancer Neg Hx    She indicated that her mother is deceased. She indicated that her father is deceased. She indicated that her sister is alive. She indicated that two of her three brothers are alive. She indicated that the status of her neg hx is unknown.   Social History    Social History   Socioeconomic History   Marital status: Widowed    Spouse name: Not on file   Number of children: 2   Years of education: College   Highest education level: Bachelor's degree (e.g., BA, AB, BS)  Occupational History   Occupation: [employer]    Comment: Full Time  Tobacco Use   Smoking status: Never   Smokeless tobacco: Never  Vaping Use   Vaping Use: Never used  Substance and Sexual Activity   Alcohol use: No   Drug use: No   Sexual activity: Not Currently  Other Topics Concern   Not on file  Social History Narrative   2 biological children and 1 step child   Social Determinants of Health   Financial Resource Strain: Low Risk  (11/02/2021)   Overall Financial Resource Strain (CARDIA)    Difficulty of Paying Living Expenses: Not hard at all  Food Insecurity: No Food  Insecurity (01/17/2023)   Hunger Vital Sign    Worried About Running Out of Food in the Last Year: Never true    Ran Out of Food in the Last Year: Never true  Transportation Needs: No Transportation Needs (01/17/2023)   PRAPARE - Administrator, Civil Service (Medical): No    Lack of Transportation (Non-Medical): No  Physical Activity: Sufficiently Active (09/10/2021)   Exercise Vital Sign    Days of Exercise per Week: 4 days    Minutes of Exercise per Session: 60 min  Stress: No Stress Concern Present (09/13/2022)   [employer] of Occupational Health - Occupational Stress Questionnaire    Feeling of  Stress : Not at all  Social Connections: Moderately Isolated (09/13/2022)   Social Connection and Isolation Panel [NHANES]    Frequency of Communication with Friends and Family: More than three times a week    Frequency of Social Gatherings with Friends and Family: More than three times a week    Attends Religious Services: More than 4 times per year    Active Member of [employer] or Organizations: No    Attends Banker Meetings: Never    Marital Status: Widowed  Intimate Partner Violence: Not At Risk (01/17/2023)   Humiliation, Afraid, Rape, and Kick questionnaire    Fear of Current or Ex-Partner: No    Emotionally Abused: No    Physically Abused: No    Sexually Abused: No     Review of Systems    General:  No chills, fever, night sweats or weight changes.  Cardiovascular:  +++ chest pain, +++ dyspnea, +++ tachypalpitations, no edema, orthopnea, paroxysmal nocturnal dyspnea. Dermatological: No rash, lesions/masses Respiratory: No cough, +++ dyspnea in setting of rapid atrial fibrillation. Urologic: No hematuria, dysuria Abdominal:   +++ nausea postoperatively, no vomiting, diarrhea, bright red blood per rectum, melena, or hematemesis Neurologic:  No visual changes, wkns, changes in mental status. All other systems reviewed and are otherwise negative except as  noted above.  Physical Exam    Blood pressure 112/65, pulse 73, temperature (!) 97.5 F (36.4 C), temperature source Oral, resp. rate 19, height 5\' 5"  (1.651 m), weight 68.3 kg, SpO2 97 %.  General: Pleasant, NAD Psych: Normal affect. Neuro: Alert and oriented X 3. Moves all extremities spontaneously. HEENT: Normal  Neck: Supple without bruits or JVD. Lungs:  Resp regular and unlabored, CTA. Heart: RRR no s3, s4, or murmurs. Abdomen: Soft, non-tender, non-distended, BS + x 4.  Extremities: No clubbing, cyanosis or edema. DP/PT2+, Radials 2+ and equal bilaterally.  Labs    Cardiac Enzymes Recent Labs  Lab 01/17/23 1024 01/17/23 1350 01/19/23 0154 01/19/23 0444  TROPONINIHS 3 4 6 15      Lab Results  Component Value Date   WBC 10.5 01/18/2023   HGB 14.4 01/18/2023   HCT 43.7 01/18/2023   MCV 96.9 01/18/2023   PLT 241 01/18/2023    Recent Labs  Lab 01/18/23 0350  NA 139  K 3.7  CL 105  CO2 27  BUN 14  CREATININE 0.57  CALCIUM 8.9  GLUCOSE 213*   Lab Results  Component Value Date   CHOL 337 (H) 11/29/2022   HDL 50 11/29/2022   LDLCALC 238 (H) 11/29/2022   TRIG 241 (H) 11/29/2022     Radiology Studies    DG Chest 1 View  Result Date: 01/19/2023 CLINICAL DATA:  Chest pain EXAM: CHEST  1 VIEW COMPARISON:  07/05/2007 FINDINGS: The heart size and mediastinal contours are within normal limits. Both lungs are clear. The visualized skeletal structures are unremarkable. IMPRESSION: No active disease. Electronically Signed   By: Deatra Robinson M.D.   On: 01/19/2023 02:13   MM 3D SCREENING MAMMOGRAM BILATERAL BREAST  Result Date: 12/24/2022 CLINICAL DATA:  Screening. EXAM: DIGITAL SCREENING BILATERAL MAMMOGRAM WITH TOMOSYNTHESIS AND CAD TECHNIQUE: Bilateral screening digital craniocaudal and mediolateral oblique mammograms were obtained. Bilateral screening digital breast tomosynthesis was performed. The images were evaluated with computer-aided detection. COMPARISON:   Previous exam(s). ACR Breast Density Category c: The breasts are heterogeneously dense, which may obscure small masses. FINDINGS: There are no findings suspicious for malignancy. IMPRESSION: No mammographic evidence of malignancy. A  result letter of this screening mammogram will be mailed directly to the patient. RECOMMENDATION: Screening mammogram in one year. (Code:SM-B-01Y) BI-RADS CATEGORY  1: Negative. Electronically Signed   By: Sherron Ales M.D.   On: 12/24/2022 10:48    ECG & Cardiac Imaging    ECG dated May 22, 12:40 AM: Atrial fibrillation, 128, left axis deviation, LVH- personally reviewed. ECG dated May 22, 1:41 AM: Atrial fibrillation, 146, left axis deviation, inferior infarct with inferior ST segment elevation-personally reviewed Follow-up ECG is pending this morning  Assessment & Plan    1.  Atrial fibrillation with rapid ventricular response: Patient is status post right carotid arterial stenting on May 20 with postoperative bradycardia requiring initiation of dopamine.  In that setting, she developed atrial fibrillation with rapid ventricular response early this morning, with rates into the 140s.  She did experience chest pain, jaw pain, and nausea for approximately 90 to 120 minutes following onset of atrial fibrillation with subsequent relief of pain with morphine.  She was then placed on IV amiodarone and has since converted to sinus rhythm as of 829 this morning.  She is currently in the 71s.  Currently symptom-free.  We will discontinue amiodarone given recent bradycardia and high likelihood that atrial fibrillation was driven by dopamine therapy.  At this time, we will avoid AV nodal blocking agents with recent bradycardia.  CHA2DS2-VASc equals 5.  Follow-up echocardiogram and continue to follow telemetry.  Will hold off on oral anticoagulation given short duration of atrial fibrillation and likely known cause though would reconsider for recurrent A-fib.  2.  Chest pain/jaw  pain/abnormal ECG: In the setting of rapid atrial fibrillation with rates in the 140s, an ECG was run off of her ICU monitor and showed inferior ST segment elevation that was not present on 2 prior ECGs.  At the time of abnormal ECG, she feels that her symptoms were likely resolving, as she received morphine around that time, and she has had no recurrent chest pain.  Troponins up to this point have been normal.  Follow-up repeat troponin.  Follow-up echocardiogram.  With known risk factors of diabetes, hyperlipidemia, and carotid arterial disease, would benefit from risk stratification.  Further recommendations pending echo.  3.  Hyperlipidemia: LDL of 238.  Has been treated with fish oil historically.  Recommend initiation of high potency statin therapy if patient willing.  4.  Carotid arterial disease: Status post stenting May 20.  Per vascular surgery.  5.  Sinus bradycardia: Following carotid arterial stenting on May 20, and requiring treatment with dopamine prior to last night (see #1).  Follow heart rates and blood pressures off of dopamine.  Avoid AV nodal blocking agents.  6.  Type 2 diabetes mellitus: Managed with berberine and alpha lipoic acid at home.  Defer to primary team.  Risk Assessment/Risk Scores:     TIMI Risk Score for Unstable Angina or Non-ST Elevation MI:   The patient's TIMI risk score is 5, which indicates a 26% risk of all cause mortality, new or recurrent myocardial infarction or need for urgent revascularization in the next 14 days.    CHA2DS2-VASc Score = 5   This indicates a 7.2% annual risk of stroke. The patient's score is based upon: CHF History: 0 HTN History: 0 Diabetes History: 1 Stroke History: 0 Vascular Disease History: 1 Age Score: 2 Gender Score: 1     Signed, Nicolasa Ducking, NP 01/19/2023, 9:57 AM  For questions or updates, please contact   Please consult [email or web address]  for contact info under Cardiology/STEMI.

## 2023-01-20 ENCOUNTER — Inpatient Hospital Stay (HOSPITAL_COMMUNITY): Payer: Medicare HMO

## 2023-01-20 DIAGNOSIS — E782 Mixed hyperlipidemia: Secondary | ICD-10-CM | POA: Diagnosis not present

## 2023-01-20 DIAGNOSIS — R001 Bradycardia, unspecified: Secondary | ICD-10-CM | POA: Insufficient documentation

## 2023-01-20 DIAGNOSIS — I4891 Unspecified atrial fibrillation: Secondary | ICD-10-CM | POA: Diagnosis not present

## 2023-01-20 DIAGNOSIS — I9589 Other hypotension: Secondary | ICD-10-CM

## 2023-01-20 DIAGNOSIS — I5A Non-ischemic myocardial injury (non-traumatic): Secondary | ICD-10-CM | POA: Diagnosis not present

## 2023-01-20 DIAGNOSIS — E1165 Type 2 diabetes mellitus with hyperglycemia: Secondary | ICD-10-CM | POA: Diagnosis not present

## 2023-01-20 DIAGNOSIS — I959 Hypotension, unspecified: Secondary | ICD-10-CM | POA: Diagnosis not present

## 2023-01-20 DIAGNOSIS — I6521 Occlusion and stenosis of right carotid artery: Secondary | ICD-10-CM | POA: Diagnosis not present

## 2023-01-20 LAB — NM MYOCAR MULTI W/SPECT W/WALL MOTION / EF
Estimated workload: 1
Exercise duration (min): 0 min
Exercise duration (sec): 0 s
LV dias vol: 49 mL (ref 46–106)
LV sys vol: 15 mL
MPHR: 143 {beats}/min
Nuc Stress EF: 69 %
Peak HR: 89 {beats}/min
Percent HR: 62 %
Rest HR: 51 {beats}/min
Rest Nuclear Isotope Dose: 9.9 mCi
SDS: 0
SRS: 10
SSS: 6
Stress Nuclear Isotope Dose: 30.3 mCi
TID: 0.93

## 2023-01-20 LAB — RENAL FUNCTION PANEL
Albumin: 3.3 g/dL — ABNORMAL LOW (ref 3.5–5.0)
Anion gap: 5 (ref 5–15)
BUN: 28 mg/dL — ABNORMAL HIGH (ref 8–23)
CO2: 26 mmol/L (ref 22–32)
Calcium: 8.8 mg/dL — ABNORMAL LOW (ref 8.9–10.3)
Chloride: 106 mmol/L (ref 98–111)
Creatinine, Ser: 0.61 mg/dL (ref 0.44–1.00)
GFR, Estimated: >60 mL/min (ref >60)
Glucose, Bld: 156 mg/dL — ABNORMAL HIGH (ref 70–99)
Phosphorus: 4.1 mg/dL (ref 2.5–4.6)
Potassium: 3.7 mmol/L (ref 3.5–5.1)
Sodium: 137 mmol/L (ref 135–145)

## 2023-01-20 LAB — GLUCOSE, CAPILLARY: Glucose-Capillary: 147 mg/dL — ABNORMAL HIGH (ref 70–99)

## 2023-01-20 LAB — CBC
HCT: 33.5 % — ABNORMAL LOW (ref 36.0–46.0)
Hemoglobin: 10.7 g/dL — ABNORMAL LOW (ref 12.0–15.0)
MCH: 32.2 pg (ref 26.0–34.0)
MCHC: 31.9 g/dL (ref 30.0–36.0)
MCV: 100.9 fL — ABNORMAL HIGH (ref 80.0–100.0)
Platelets: 183 10*3/uL (ref 150–400)
RBC: 3.32 MIL/uL — ABNORMAL LOW (ref 3.87–5.11)
RDW: 12.4 % (ref 11.5–15.5)
WBC: 5.3 10*3/uL (ref 4.0–10.5)
nRBC: 0 % (ref 0.0–0.2)

## 2023-01-20 MED ORDER — TECHNETIUM TC 99M TETROFOSMIN IV KIT
10.0000 | PACK | Freq: Once | INTRAVENOUS | Status: AC | PRN
  Administered 2023-01-20: 9.87 via INTRAVENOUS

## 2023-01-20 MED ORDER — FAMOTIDINE 20 MG PO TABS: 20.0000 mg | ORAL_TABLET | Freq: Two times a day (BID) | ORAL | Status: DC

## 2023-01-20 MED ORDER — REGADENOSON 0.4 MG/5ML IV SOLN
0.4000 mg | Freq: Once | INTRAVENOUS | Status: AC
  Administered 2023-01-20: 0.4 mg via INTRAVENOUS
  Filled 2023-01-20: qty 5

## 2023-01-20 MED ORDER — TECHNETIUM TC 99M TETROFOSMIN IV KIT
30.0000 | PACK | Freq: Once | INTRAVENOUS | Status: AC | PRN
  Administered 2023-01-20: 30.27 via INTRAVENOUS

## 2023-01-20 MED ORDER — ASPIRIN 81 MG PO TBEC: 81.0000 mg | DELAYED_RELEASE_TABLET | Freq: Every day | ORAL | 2 refills | Status: DC

## 2023-01-20 NOTE — Progress Notes (Signed)
Pt discharged to home with son. All discharge instructions discussed prior to discharge. All questions answered.

## 2023-01-20 NOTE — Assessment & Plan Note (Signed)
Now and normal sinus rhythm.  Likely triggered with dopamine.

## 2023-01-20 NOTE — Discharge Summary (Signed)
[facility]    Discharge Summary    Patient ID:  Christine Richards MRN: [id number] DOB/AGE: [birth year] 77 y.o.  Admit date: 01/17/2023 Discharge date: 01/20/2023 Date of Surgery: 01/17/2023 Surgeon: Surgeon(s): Wyn Quaker Marlow Baars, MD  Admission Diagnosis: Carotid stenosis, right [I65.21]  Discharge Diagnoses:  Carotid stenosis, right [I65.21]  Secondary Diagnoses: Past Medical History:  Diagnosis Date   Arthritis    rheumatoid   Carotid arterial disease (HCC)    Diabetes mellitus without complication (HCC)    Diverticulosis    Environmental and seasonal allergies    Hyperlipidemia    Osteopenia    Sciatica 2024   Sinus congestion    Sleep apnea    uses CPAP   TIA (transient ischemic attack)     Procedure(s): CAROTID PTA/STENT INTERVENTION  Discharged Condition: good  HPI:  Patient admitted with high grade right carotid stenosis for stent placement.  Hospital Course:  Christine Richards is a 77 y.o. female is S/P Right Procedure(s): CAROTID PTA/STENT INTERVENTION Extubated: POD #  NA Physical exam: access site without hematoma, neuro exam normal Post-op wounds clean, dry, intact or healing well Pt. Ambulating, voiding and taking PO diet without difficulty. Pt pain controlled with PO pain meds. Labs as below Complications:had post op atrial fibrillation when on Dopamine to boost BP.  Resolved and stress test was normal.   Consults:  Treatment Team:  Yvonne Kendall, MD  Significant Diagnostic Studies: CBC Lab Results  Component Value Date   WBC 5.3 01/20/2023   HGB 10.7 (L) 01/20/2023   HCT 33.5 (L) 01/20/2023   MCV 100.9 (H) 01/20/2023   PLT 183 01/20/2023    BMET    Component Value Date/Time   NA 137 01/20/2023 0417   NA 142 11/29/2022 1502   NA 138 03/17/2013 1443   K 3.7 01/20/2023 0417   K 3.6 03/17/2013 1443   CL 106 01/20/2023 0417   CL 105 03/17/2013 1443   CO2 26 01/20/2023 0417   CO2 30 03/17/2013 1443   GLUCOSE  156 (H) 01/20/2023 0417   GLUCOSE 192 (H) 03/17/2013 1443   BUN 28 (H) 01/20/2023 0417   BUN 18 11/29/2022 1502   BUN 16 03/17/2013 1443   CREATININE 0.61 01/20/2023 0417   CREATININE 0.72 03/17/2013 1443   CALCIUM 8.8 (L) 01/20/2023 0417   CALCIUM 9.1 03/17/2013 1443   GFRNONAA >60 01/20/2023 0417   GFRNONAA >60 03/17/2013 1443   GFRAA 101 04/18/2020 1505   GFRAA >60 03/17/2013 1443   COAG No results found for: "INR", "PROTIME"   Disposition:  Discharge to :Home Discharge Instructions     Call MD for:  redness, tenderness, or signs of infection (pain, swelling, bleeding, redness, odor or green/yellow discharge around incision site)   Complete by: As directed    Call MD for:  severe or increased pain, loss or decreased feeling  in affected limb(s)   Complete by: As directed    Call MD for:  temperature >100.5   Complete by: As directed    No dressing needed   Complete by: As directed    Replace only if drainage present   Resume previous diet   Complete by: As directed       Allergies as of 01/20/2023       Reactions   Antihistamines, Chlorpheniramine-type    Facial swelling    Macrobid [nitrofurantoin Macrocrystal]    unknown   Statins    Muscle Pain, Weakness  Medication List     TAKE these medications    acetaminophen 160 MG/5ML elixir Commonly known as: TYLENOL Take by mouth every 4 (four) hours as needed for fever.   ALPHA LIPOIC ACID PO Take by mouth.   ALPRAZolam 0.5 MG tablet Commonly known as: XANAX Take 1 tablet (0.5 mg total) by mouth 2 (two) times daily as needed for anxiety.   aspirin EC 81 MG tablet Take 1 tablet (81 mg total) by mouth daily. Swallow whole.   Berberine Chloride 500 MG Caps   BIOTIN PO Take by mouth.   blood glucose meter kit and supplies Dispense based on patient and insurance preference. To check sugar once daily.   Blood Glucose Monitoring Suppl Devi 1 each by Does not apply route in the morning, at  noon, and at bedtime. May substitute to any manufacturer covered by patient's insurance.   clopidogrel 75 MG tablet Commonly known as: PLAVIX Take 1 tablet (75 mg total) by mouth daily.   estradiol 0.1 MG/GM vaginal cream Commonly known as: ESTRACE Place 1 Applicatorful vaginally at bedtime.   Fish Oil 1000 MG Caps Take 1,000-2,000 mg by mouth at bedtime.   meloxicam 15 MG tablet Commonly known as: MOBIC Take 15 mg by mouth daily.   True Metrix Blood Glucose Test test strip Generic drug: glucose blood TEST BLOOD SUGAR ONE TIME DAILY   BLOOD GLUCOSE TEST STRIPS Strp 1 each by In Vitro route daily. May substitute to any manufacturer covered by patient's insurance.   TRUEplus Lancets 33G Misc   Vitamin B-12 1000 MCG Subl Place 1 tablet (1,000 mcg total) under the tongue daily.   Vitamin D 50 MCG (2000 UT) Caps Take 4,000 Units by mouth daily.      Unable to tolerate statins so no statin ordered  Verbal and written Discharge instructions given to the patient. Wound care per Discharge AVS  Follow-up Information     Georgiana Spinner, NP Follow up in 3 week(s).   Specialty: Vascular Surgery Why: with a carotid duplex Contact information: [address] [place] [address] [phone]                 Signed: Festus Barren, MD  01/20/2023, 4:07 PM

## 2023-01-20 NOTE — Progress Notes (Signed)
Cardiology Progress Note   Patient Name: Christine Richards. Date of Encounter: 01/20/2023  Primary Cardiologist: Yvonne Kendall, MD  Subjective   Feels well this AM.  No recurrent afib/chest pain.  For lexiscan myoview this AM and hopes to be discharged this afternoon.  Inpatient Medications    Scheduled Meds:  aspirin EC  81 mg Oral Q0600   Chlorhexidine Gluconate Cloth  6 each Topical Daily   cholecalciferol  4,000 Units Oral Daily   clopidogrel  75 mg Oral Daily   cyanocobalamin  1,000 mcg Oral Daily   estradiol  1 Applicatorful Vaginal QHS   insulin aspart  0-15 Units Subcutaneous TID WC   insulin aspart  0-5 Units Subcutaneous QHS   insulin aspart  3 Units Subcutaneous TID WC   omega-3 acid ethyl esters  2 g Oral Daily   Continuous Infusions:  sodium chloride Stopped (01/18/23 1200)   sodium chloride     famotidine (PEPCID) IV Stopped (01/19/23 2307)   magnesium sulfate bolus IVPB     sodium chloride Stopped (01/19/23 0808)   PRN Meds: acetaminophen **OR** acetaminophen, ALPRAZolam, alum & mag hydroxide-simeth, guaiFENesin-dextromethorphan, hydrALAZINE, HYDROmorphone (DILAUDID) injection, labetalol, magnesium sulfate bolus IVPB, metoprolol tartrate, morphine injection, nitroGLYCERIN, ondansetron (ZOFRAN) IV, mouth rinse, oxyCODONE-acetaminophen, phenol, potassium chloride, promethazine, technetium tetrofosmin   Vital Signs    Vitals:   01/20/23 0601 01/20/23 0700 01/20/23 0810 01/20/23 0811  BP:  (!) 100/54 (!) 105/49   Pulse: (!) 59     Resp: 18 19 (!) 22   Temp:   98.4 F (36.9 C)   TempSrc:   Oral   SpO2: (!) 88%   91%  Weight:      Height:        Intake/Output Summary (Last 24 hours) at 01/20/2023 1139 Last data filed at 01/20/2023 0916 Gross per 24 hour  Intake 412.05 ml  Output 375 ml  Net 37.05 ml   Filed Weights   01/17/23 0854 01/17/23 1239  Weight: 68 kg 68.3 kg    Physical Exam   GEN: Well nourished, well developed, in no acute distress.   HEENT: Grossly normal.  Neck: Supple, no JVD, carotid bruits, or masses. Cardiac: RRR, 2/6 syst murmur @ apex.  No rubs or gallops. No clubbing, cyanosis, edema.  Radials 2+, DP/PT 2+ and equal bilaterally.  Respiratory:  Respirations regular and unlabored, clear to auscultation bilaterally. GI: Soft, nontender, nondistended, BS + x 4. MS: no deformity or atrophy. Skin: warm and dry, no rash. Neuro:  Strength and sensation are intact. Psych: AAOx3.  Normal affect.  Labs    Chemistry Recent Labs  Lab 01/17/23 0840 01/18/23 0350 01/20/23 0417  NA  --  139 137  K  --  3.7 3.7  CL  --  105 106  CO2  --  27 26  GLUCOSE  --  213* 156*  BUN 19 14 28*  CREATININE 0.60 0.57 0.61  CALCIUM  --  8.9 8.8*  ALBUMIN  --   --  3.3*  GFRNONAA >60 >60 >60  ANIONGAP  --  7 5     Hematology Recent Labs  Lab 01/18/23 0350 01/20/23 0417  WBC 10.5 5.3  RBC 4.51 3.32*  HGB 14.4 10.7*  HCT 43.7 33.5*  MCV 96.9 100.9*  MCH 31.9 32.2  MCHC 33.0 31.9  RDW 12.2 12.4  PLT 241 183    Cardiac Enzymes  Recent Labs  Lab 01/19/23 0154 01/19/23 0444 01/19/23 0954 01/19/23 1323 01/19/23 1527  TROPONINIHS 6 15 51* 58* 55*     Lipids  Lab Results  Component Value Date   CHOL 337 (H) 11/29/2022   HDL 50 11/29/2022   LDLCALC 238 (H) 11/29/2022   TRIG 241 (H) 11/29/2022   CHOLHDL 6.7 (H) 11/29/2022    UXL2G  Lab Results  Component Value Date   HGBA1C 7.6 (H) 11/29/2022    Radiology    DG Chest 1 View  Result Date: 01/19/2023 CLINICAL DATA:  Chest pain EXAM: CHEST  1 VIEW COMPARISON:  07/05/2007 FINDINGS: The heart size and mediastinal contours are within normal limits. Both lungs are clear. The visualized skeletal structures are unremarkable. IMPRESSION: No active disease. Electronically Signed   By: Deatra Robinson M.D.   On: 01/19/2023 02:13   PERIPHERAL VASCULAR CATHETERIZATION  Result Date: 01/17/2023 See surgical note for result.   Telemetry    Seen in nuc med - sinus  brady - Personally Reviewed  Cardiac Studies   2D Echocardiogram 5.22.2024  1. Left ventricular ejection fraction, by estimation, is 60 to 65%. The  left ventricle has normal function. The left ventricle has no regional  wall motion abnormalities. There is mild left ventricular hypertrophy.  Left ventricular diastolic parameters  are indeterminate.   2. Right ventricular systolic function is normal. The right ventricular  size is normal. Tricuspid regurgitation signal is inadequate for assessing  PA pressure.   3. The mitral valve is normal in structure. Mild mitral valve  regurgitation. No evidence of mitral stenosis.   4. The aortic valve has an indeterminant number of cusps. There is mild  thickening of the aortic valve. Aortic valve regurgitation is not  visualized. Aortic valve sclerosis is present, with no evidence of aortic  valve stenosis.   Patient Profile      77 y.o. female with a history of DM, HL, TIA, OSA, and carotid disease s/p R carotid stenting, who is being seen today for the evaluation of bradycardia requiring dopamine followed by development of Afib w/ RVR and chest/jaw pain. Echo w/ nl EF.  Assessment & Plan    1.  PAF:  pt developed AF RVR on 5/22  in the setting of dopamine therapy following R carotid stenting and sinus brady.  She was subsequently placed on IV amio and converted to sinus rhythm on the morning of 5/22.  Amio d/c'd 5/22.  No recurrent AF.  Echo w/ nl EF.  No AVN blocking agents in the setting of ongoing sinus brady.  No OAC given short duration of AF and inciting medication.  2.  Chest pain/jaw pain/abnl ECG/demand ischemia:  in setting of rapid afib on 5/22, pt developed chest pain radiating to her jaw, assoc w/ dyspnea.  Symptoms resolved prior to resolution of Afib.  ECG performed ~ 90 mins after onset showed inferior ST elevation, though f/u showed resolution.  HsTrop up to 58.  Echo w/ nl EF and w/o rwma.  No recurrent c/p.  Lexiscan myoview  today (just completed, imaging pending).  On asa/plavix.  Needs aggressive lipid mgmt - previously intolerant to statins (see below)  3.  HL:  LDL 238.  On fish oil @ home.  If unable to take statin, rec bempedoic acid + ezetimibe or PCSK9i if intolerant to that.    4.  Carotid arterial dzs:  s/p stenting 5/20.  ASA/plavix per [facility].  5.  Sinus brady:  stable off of dopamine.   6.  DMII:  per IM.  Managed with berberine and alpha  lipoic acid at home.   A1c 7.6 in April.  7.  Macrocytic anemia:  H/H down since admission.  No active bleeding.  Suspect 2/2 blood draws. Will need outpt f/u if d/c'd today.  Signed, Nicolasa Ducking, NP  01/20/2023, 11:39 AM    For questions or updates, please contact   Please consult [email or web address] for contact info under Cardiology/STEMI.

## 2023-01-20 NOTE — Assessment & Plan Note (Signed)
Secondary to fast heart rate.  Stress test today.

## 2023-01-20 NOTE — Assessment & Plan Note (Signed)
Patient asymptomatic.

## 2023-01-20 NOTE — Hospital Course (Signed)
77 year old female brought in for elective high-grade right carotid stenosis and stent procedure by Dr. Wyn Quaker on 01/17/2023.  Postoperatively had hypotension and bradycardia and was started on dopamine and went into rapid atrial fibrillation.  Patient had ST segment changes with the rapid atrial fibrillation and stress test was ordered for 523.

## 2023-01-20 NOTE — Progress Notes (Signed)
  Progress Note   Patient: Christine Richards YQM:[id number] DOB: 12/08/1945 DOA: 01/17/2023     3 DOS: the patient was seen and examined on 01/20/2023   Brief hospital course: 77 year old female brought in for elective high-grade right carotid stenosis and stent procedure by Dr. Wyn Quaker on 01/17/2023.  Postoperatively had hypotension and bradycardia and was started on dopamine and went into rapid atrial fibrillation.  Patient had ST segment changes with the rapid atrial fibrillation and stress test was ordered for 523.  Assessment and Plan: * Carotid stenosis, right Status post stenting procedure on 5/20.  Sinus bradycardia Patient states her normal heart rate is in 50s.  Hypotension Patient asymptomatic  Myocardial injury Secondary to fast heart rate.  Stress test today.  Atrial fibrillation with rapid ventricular response (HCC) Now and normal sinus rhythm.  Likely triggered with dopamine.  Uncontrolled type 2 diabetes mellitus with hyperglycemia, without long-term current use of insulin (HCC) Hemoglobin A1c 7.6.  Diet control.        Subjective: Patient feeling okay. She offers no complaints of chest pain or shortness of breath. Had elective vascular procedure.  Physical Exam: Vitals:   01/20/23 0700 01/20/23 0810 01/20/23 0811 01/20/23 1000  BP: (!) 100/54 (!) 105/49  (!) 120/90  Pulse:    (!) 51  Resp: 19 (!) 22  16  Temp:  98.4 F (36.9 C)    TempSrc:  Oral    SpO2:   91% 90%  Weight:      Height:       Physical Exam HENT:     Head: Normocephalic.     Mouth/Throat:     Pharynx: No oropharyngeal exudate.  Cardiovascular:     Rate and Rhythm: Normal rate and regular rhythm.     Pulses:          Radial pulses are 1+ on the left side.     Heart sounds: Murmur heard.     Systolic murmur is present with a grade of 2/6.  Pulmonary:     Breath sounds: No decreased breath sounds, wheezing, rhonchi or rales.  Abdominal:     Palpations: Abdomen is soft.     Tenderness:  There is no abdominal tenderness.  Musculoskeletal:     Right lower leg: No swelling.     Left lower leg: No swelling.  Skin:    General: Skin is warm.     Findings: No rash.  Neurological:     Mental Status: She is alert and oriented to person, place, and time.     Data Reviewed: Cr 0.61, hemoglobin 10.7, MCV 100.9    Disposition: Status is: Inpatient Remains inpatient appropriate because: stress test today, pending results will depend on disposition  Planned Discharge Destination: Home    Time spent: 27 minutes  Author: Alford Highland, MD 01/20/2023 12:40 PM  For on call review [email or web address].

## 2023-01-20 NOTE — Assessment & Plan Note (Signed)
Hemoglobin A1c 7.6.  Diet control.

## 2023-01-20 NOTE — Assessment & Plan Note (Signed)
Status post stenting procedure on 5/20.

## 2023-01-20 NOTE — Assessment & Plan Note (Signed)
Patient states her normal heart rate is in 50s.

## 2023-01-20 NOTE — Progress Notes (Signed)
[facility]  Daily Progress Note   Subjective  -   Feels well.  No chest pain.  Has been in sinus rhythm for past 24 hours.  Objective Vitals:   01/20/23 0558 01/20/23 0559 01/20/23 0600 01/20/23 0601  BP:   (!) 100/41   Pulse: (!) 54 (!) 53 (!) 52 (!) 59  Resp: 18 17 19 18   Temp:      TempSrc:      SpO2: 91% 94% 94% (!) 88%  Weight:      Height:        Intake/Output Summary (Last 24 hours) at 01/20/2023 0833 Last data filed at 01/20/2023 0600 Gross per 24 hour  Intake 481.75 ml  Output 750 ml  Net -268.25 ml    PULM  CTAB CV  RRR, slightly bradycardic VASC  access site without hematoma.  Neuroexam normal.  Laboratory CBC    Component Value Date/Time   WBC 5.3 01/20/2023 0417   HGB 10.7 (L) 01/20/2023 0417   HGB 13.5 11/29/2022 1502   HCT 33.5 (L) 01/20/2023 0417   HCT 40.6 11/29/2022 1502   PLT 183 01/20/2023 0417   PLT 241 11/29/2022 1502    BMET    Component Value Date/Time   NA 137 01/20/2023 0417   NA 142 11/29/2022 1502   NA 138 03/17/2013 1443   K 3.7 01/20/2023 0417   K 3.6 03/17/2013 1443   CL 106 01/20/2023 0417   CL 105 03/17/2013 1443   CO2 26 01/20/2023 0417   CO2 30 03/17/2013 1443   GLUCOSE 156 (H) 01/20/2023 0417   GLUCOSE 192 (H) 03/17/2013 1443   BUN 28 (H) 01/20/2023 0417   BUN 18 11/29/2022 1502   BUN 16 03/17/2013 1443   CREATININE 0.61 01/20/2023 0417   CREATININE 0.72 03/17/2013 1443   CALCIUM 8.8 (L) 01/20/2023 0417   CALCIUM 9.1 03/17/2013 1443   GFRNONAA >60 01/20/2023 0417   GFRNONAA >60 03/17/2013 1443   GFRAA 101 04/18/2020 1505   GFRAA >60 03/17/2013 1443    Assessment/Planning: POD #3 s/p right carotid stent for high-grade carotid stenosis  Stress test scheduled for today Has been in sinus rhythm with good blood pressure and no symptoms for the past 24 hours but had chest pain with her atrial fibrillation Tuesday night. Appreciate cardiology input If stress test is unrevealing, likely  home later this afternoon   Festus Barren  01/20/2023, 8:33 AM

## 2023-01-20 NOTE — Progress Notes (Signed)
PHARMACIST - PHYSICIAN COMMUNICATION  DR:   Wyn Quaker  CONCERNING: IV to Oral Route Change Policy  RECOMMENDATION: This patient is receiving famotidine by the intravenous route.  Based on criteria approved by the Pharmacy and Therapeutics Committee, the intravenous medication(s) is/are being converted to the equivalent oral dose form(s).   DESCRIPTION: These criteria include: The patient is eating (either orally or via tube) and/or has been taking other orally administered medications for a least 24 hours The patient has no evidence of active gastrointestinal bleeding or impaired GI absorption (gastrectomy, short bowel, patient on TNA or NPO).  If you have questions about this conversion, please contact the Pharmacy Department  []   [phone] )  [facility] [x]   [phone] )  [facility] []   [phone] )  [facility] []   [phone] )  [facility] []   [phone] )  [facility]   Lowella Bandy, [facility] 01/20/2023 1:54 PM

## 2023-01-21 ENCOUNTER — Telehealth: Payer: Self-pay

## 2023-01-21 ENCOUNTER — Ambulatory Visit: Payer: Self-pay

## 2023-01-21 NOTE — Chronic Care Management (AMB) (Signed)
   01/21/2023  Christine Karen V. [birth year] [id number]   Reason for Encounter: Patient is not currently enrolled in the CCM program. CCM status changed to previously enrolled.   [facility]/Chronic Care Management [phone]

## 2023-01-21 NOTE — Transitions of Care (Post Inpatient/ED Visit) (Signed)
01/21/2023  Name: Christine Richards MRN: [id number] DOB: 1946/06/23  Today's TOC FU Call Status: Today's TOC FU Call Status:: Successful TOC FU Call Competed TOC FU Call Complete Date: 01/21/23  Transition Care Management Follow-up Telephone Call Date of Discharge: 01/20/23 Discharge Facility: [facility]) Type of Discharge: Inpatient Admission Primary Inpatient Discharge Diagnosis:: Right Carotid Stenosis How have you been since you were released from the hospital?: Better Any questions or concerns?: No  Items Reviewed: Did you receive and understand the discharge instructions provided?: Yes Medications obtained,verified, and reconciled?: Yes (Medications Reviewed) Any new allergies since your discharge?: No Dietary orders reviewed?: Yes Type of Diet Ordered:: Heart Healthy  Medications Reviewed Today: Medications Reviewed Today     Reviewed by Jodelle Gross, RN (Case Manager) on 01/21/23 at 1142  Med List Status: <None>   Medication Order Taking? Sig Documenting Provider Last Dose Status Informant  acetaminophen (TYLENOL) 160 MG/5ML elixir [id number] Yes Take by mouth every 4 (four) hours as needed for fever. [provider] Taking Active   ALPHA LIPOIC ACID PO [id number] No Take by mouth. [provider] Unknown Active   ALPRAZolam Prudy Feeler) 0.5 MG tablet [id number] No Take 1 tablet (0.5 mg total) by mouth 2 (two) times daily as needed for anxiety. Jacky Kindle, FNP Unknown Active   aspirin EC 81 MG tablet [id number] Yes Take 1 tablet (81 mg total) by mouth daily. Swallow whole. Annice Needy, MD Taking Active   Berberine Chloride 500 MG CAPS [id number] No  [provider] Unknown Active   BIOTIN PO [id number]  Take by mouth. [provider]  Active   blood glucose meter kit and supplies [id number] Yes Dispense based on patient and insurance preference. To check sugar once daily. Jacky Kindle, FNP Taking Active   Blood  Glucose Monitoring Suppl DEVI [id number] Yes 1 each by Does not apply route in the morning, at noon, and at bedtime. May substitute to any manufacturer covered by patient's insurance. Jacky Kindle, FNP Taking Active   Cholecalciferol (VITAMIN D) 2000 UNITS CAPS [id number] Yes Take 4,000 Units by mouth daily. [provider] Taking Active Self           Med Note Joella Prince A   Mon Nov 08, 2016  1:00 PM)    clopidogrel (PLAVIX) 75 MG tablet [id number] Yes Take 1 tablet (75 mg total) by mouth daily. Annice Needy, MD Taking Active   Cyanocobalamin (VITAMIN B-12) 1000 MCG SUBL [id number] Yes Place 1 tablet (1,000 mcg total) under the tongue daily. Erasmo Downer, MD Taking Active   estradiol (ESTRACE) 0.1 MG/GM vaginal cream [id number] Yes Place 1 Applicatorful vaginally at bedtime. [provider] Taking Active   Glucose Blood (BLOOD GLUCOSE TEST STRIPS) STRP [id number]  1 each by In Vitro route daily. May substitute to any manufacturer covered by patient's insurance. Merita Norton T, FNP  Active   glucose blood (TRUE METRIX BLOOD GLUCOSE TEST) test strip [id number]  TEST BLOOD SUGAR ONE TIME DAILY Merita Norton T, FNP  Active   meloxicam (MOBIC) 15 MG tablet [id number] No Take 15 mg by mouth daily.  Patient not taking: Reported on 01/17/2023   [provider] Not Taking Active   Omega-3 Fatty Acids (FISH OIL) 1000 MG CAPS [id number] Yes Take 1,000-2,000 mg by mouth at bedtime.  [provider] Taking Active Self           Med Note Sherrie Mustache, [place] A   Mon  Nov 08, 2016  1:00 PM)    TRUEplus Lancets 33G MISC [id number]   [provider]  Active             Home Care and Equipment/Supplies: Were Home Health Services Ordered?: No Any new equipment or medical supplies ordered?: No  Functional Questionnaire: Do you need assistance with bathing/showering or dressing?: No Do you need assistance with meal preparation?: No Do you need assistance with eating?:  No Do you have difficulty maintaining continence: No Do you need assistance with getting out of bed/getting out of a chair/moving?: No Do you have difficulty managing or taking your medications?: No  Follow up appointments reviewed: PCP Follow-up appointment confirmed?: NA Specialist Hospital Follow-up appointment confirmed?: Yes Date of Specialist follow-up appointment?: 02/10/23 Follow-Up Specialty Provider:: Sheppard Plumber, NP Do you understand care options if your condition(s) worsen?: Yes-patient verbalized understanding  SDOH Interventions Today    Flowsheet Row Most Recent Value  SDOH Interventions   Food Insecurity Interventions Intervention Not Indicated  Housing Interventions Intervention Not Indicated  Transportation Interventions Intervention Not Indicated      Jodelle Gross, RN, BSN, CCM Care Management Coordinator [facility] Phone: [phone]/Fax: [phone]

## 2023-01-24 DIAGNOSIS — R69 Illness, unspecified: Secondary | ICD-10-CM | POA: Diagnosis not present

## 2023-02-02 ENCOUNTER — Other Ambulatory Visit (INDEPENDENT_AMBULATORY_CARE_PROVIDER_SITE_OTHER): Payer: Self-pay | Admitting: Vascular Surgery

## 2023-02-02 DIAGNOSIS — I6523 Occlusion and stenosis of bilateral carotid arteries: Secondary | ICD-10-CM

## 2023-02-11 ENCOUNTER — Encounter (INDEPENDENT_AMBULATORY_CARE_PROVIDER_SITE_OTHER): Payer: Self-pay | Admitting: Nurse Practitioner

## 2023-02-11 ENCOUNTER — Ambulatory Visit (INDEPENDENT_AMBULATORY_CARE_PROVIDER_SITE_OTHER): Payer: Medicare HMO | Admitting: Nurse Practitioner

## 2023-02-11 ENCOUNTER — Ambulatory Visit (INDEPENDENT_AMBULATORY_CARE_PROVIDER_SITE_OTHER): Payer: Medicare HMO

## 2023-02-11 VITALS — BP 121/72 | HR 61 | Resp 16 | Wt 150.0 lb

## 2023-02-11 DIAGNOSIS — I6523 Occlusion and stenosis of bilateral carotid arteries: Secondary | ICD-10-CM | POA: Diagnosis not present

## 2023-02-11 DIAGNOSIS — I4891 Unspecified atrial fibrillation: Secondary | ICD-10-CM

## 2023-02-11 DIAGNOSIS — E782 Mixed hyperlipidemia: Secondary | ICD-10-CM

## 2023-02-11 DIAGNOSIS — E1165 Type 2 diabetes mellitus with hyperglycemia: Secondary | ICD-10-CM

## 2023-02-11 NOTE — Progress Notes (Signed)
Subjective:    Patient ID: Christine Richards., female    DOB: January 29, 1946, 77 y.o.   MRN: [id number] Chief Complaint  Patient presents with   Follow-up    [facility] 3 week with carotid     The patient is seen for follow up evaluation of carotid stenosis status post right carotid stent on 01/17/2023.  The patient had an episode of atrial fibrillation postsurgery.  She has had 1 recurrence since then and it was paroxysmal in nature.  The patient denies neck or incisional pain.  The patient denies interval amaurosis fugax. There is no recent history of TIA symptoms or focal motor deficits. There is no prior documented CVA.  The patient denies headache.  The patient is taking enteric-coated aspirin 81 mg daily.  No recent shortening of the patient's walking distance or new symptoms consistent with claudication.  No history of rest pain symptoms. No new ulcers or wounds of the lower extremities have occurred.  There is no history of DVT, PE or superficial thrombophlebitis. No recent episodes of angina or shortness of breath documented.   Today noninvasive studies show widely patent right ICA.  There is a 1 to 39% stenosis of the left ICA.  The vertebral arteries have antegrade flow bilaterally.  Normal flow in the dynamics in the bilateral subclavian arteries.     Review of Systems     Objective:   Physical Exam  BP 121/72 (BP Location: Left Arm)   Pulse 61   Resp 16   Wt 150 lb (68 kg)   BMI 24.96 kg/m   Past Medical History:  Diagnosis Date   Arthritis    rheumatoid   Carotid arterial disease (HCC)    Diabetes mellitus without complication (HCC)    Diverticulosis    Environmental and seasonal allergies    Hyperlipidemia    Osteopenia    Sciatica 2024   Sinus congestion    Sleep apnea    uses CPAP   TIA (transient ischemic attack)     Social History   Socioeconomic History   Marital status: Widowed    Spouse name: Not on file   Number of children: 2   Years of  education: College   Highest education level: Bachelor's degree (e.g., BA, AB, BS)  Occupational History   Occupation: [employer]    Comment: Full Time  Tobacco Use   Smoking status: Never   Smokeless tobacco: Never  Vaping Use   Vaping Use: Never used  Substance and Sexual Activity   Alcohol use: No   Drug use: No   Sexual activity: Not Currently  Other Topics Concern   Not on file  Social History Narrative   2 biological children and 1 step child   Social Determinants of Health   Financial Resource Strain: Low Risk  (11/02/2021)   Overall Financial Resource Strain (CARDIA)    Difficulty of Paying Living Expenses: Not hard at all  Food Insecurity: No Food Insecurity (01/21/2023)   Hunger Vital Sign    Worried About Running Out of Food in the Last Year: Never true    Ran Out of Food in the Last Year: Never true  Transportation Needs: No Transportation Needs (01/21/2023)   PRAPARE - Administrator, Civil Service (Medical): No    Lack of Transportation (Non-Medical): No  Physical Activity: Sufficiently Active (09/10/2021)   Exercise Vital Sign    Days of Exercise per Week: 4 days    Minutes  of Exercise per Session: 60 min  Stress: No Stress Concern Present (09/13/2022)   [employer] of Occupational Health - Occupational Stress Questionnaire    Feeling of Stress : Not at all  Social Connections: Moderately Isolated (09/13/2022)   Social Connection and Isolation Panel [NHANES]    Frequency of Communication with Friends and Family: More than three times a week    Frequency of Social Gatherings with Friends and Family: More than three times a week    Attends Religious Services: More than 4 times per year    Active Member of [employer] or Organizations: No    Attends Banker Meetings: Never    Marital Status: Widowed  Intimate Partner Violence: Not At Risk (01/17/2023)   Humiliation, Afraid, Rape, and Kick questionnaire    Fear of  Current or Ex-Partner: No    Emotionally Abused: No    Physically Abused: No    Sexually Abused: No    Past Surgical History:  Procedure Laterality Date   BREAST BIOPSY Right 2010   benign   BREAST CYST ASPIRATION Right    BREAST SURGERY Right 2010   biopsy   CAROTID PTA/STENT INTERVENTION Right 01/17/2023   Procedure: CAROTID PTA/STENT INTERVENTION;  Surgeon: Annice Needy, MD;  Location: [facility] INVASIVE CV LAB;  Service: Cardiovascular;  Laterality: Right;   CATARACT EXTRACTION W/PHACO Right 11/11/2016   Procedure: CATARACT EXTRACTION PHACO AND INTRAOCULAR LENS PLACEMENT (IOC);  Surgeon: Nevada Crane, MD;  Location: [facility] ORS;  Service: Ophthalmology;  Laterality: Right;  [place] 1:04.9 AP% 14.6 CDE 9.64 Fluid pack lot # [id number] H   CATARACT EXTRACTION W/PHACO Left 09/26/2019   Procedure: CATARACT EXTRACTION PHACO AND INTRAOCULAR LENS PLACEMENT (IOC) LEFT DIABETIC SYMFONY LENS 12.82 01:22.2 15.6%;  Surgeon: Lockie Mola, MD;  Location: [facility];  Service: Ophthalmology;  Laterality: Left;   ESOPHAGOGASTRODUODENOSCOPY (EGD) WITH PROPOFOL N/A 12/29/2015   Procedure: ESOPHAGOGASTRODUODENOSCOPY (EGD) WITH PROPOFOL;  Surgeon: Midge Minium, MD;  Location: [facility];  Service: Endoscopy;  Laterality: N/A;  GASTRI BIOPSY   TUBAL LIGATION      Family History  Problem Relation Age of Onset   Congestive Heart Failure Mother    Heart attack Mother    Dementia Father    Heart attack Father    Prostate cancer Father    Atrial fibrillation Sister    Hyperlipidemia Sister    Heart disease Brother    Breast cancer Neg Hx     Allergies  Allergen Reactions   Antihistamines, Chlorpheniramine-Type     Facial swelling    Macrobid [Nitrofurantoin Macrocrystal]     unknown   Statins     Muscle Pain, Weakness        Latest Ref Rng & Units 01/20/2023    4:17 AM 01/18/2023    3:50 AM 11/29/2022    3:02 PM  CBC  WBC 4.0 - 10.5 K/uL 5.3  10.5  6.3   Hemoglobin 12.0 - 15.0  g/dL 45.4  09.8  11.9   Hematocrit 36.0 - 46.0 % 33.5  43.7  40.6   Platelets 150 - 400 K/uL 183  241  241       CMP     Component Value Date/Time   NA 137 01/20/2023 0417   NA 142 11/29/2022 1502   NA 138 03/17/2013 1443   K 3.7 01/20/2023 0417   K 3.6 03/17/2013 1443   CL 106 01/20/2023 0417   CL 105 03/17/2013 1443   CO2 26 01/20/2023 0417  CO2 30 03/17/2013 1443   GLUCOSE 156 (H) 01/20/2023 0417   GLUCOSE 192 (H) 03/17/2013 1443   BUN 28 (H) 01/20/2023 0417   BUN 18 11/29/2022 1502   BUN 16 03/17/2013 1443   CREATININE 0.61 01/20/2023 0417   CREATININE 0.72 03/17/2013 1443   CALCIUM 8.8 (L) 01/20/2023 0417   CALCIUM 9.1 03/17/2013 1443   PROT 6.3 11/29/2022 1502   PROT 7.5 03/17/2013 1443   ALBUMIN 3.3 (L) 01/20/2023 0417   ALBUMIN 4.4 11/29/2022 1502   ALBUMIN 3.8 03/17/2013 1443   AST 16 11/29/2022 1502   AST 20 03/17/2013 1443   ALT 18 11/29/2022 1502   ALT 23 03/17/2013 1443   ALKPHOS 101 11/29/2022 1502   ALKPHOS 103 03/17/2013 1443   BILITOT 0.3 11/29/2022 1502   BILITOT 0.4 03/17/2013 1443   EGFR 93 11/29/2022 1502   GFRNONAA >60 01/20/2023 0417   GFRNONAA >60 03/17/2013 1443     No results found.     Assessment & Plan:   1. Bilateral carotid artery stenosis Recommend:  The patient is s/p successful right carotid stent  Duplex ultrasound  shows widely patent right ICA stent.  With 1 to 39% left ICA stenosis.  Continue antiplatelet therapy as prescribed Continue management of CAD, HTN and Hyperlipidemia Healthy heart diet,  encouraged exercise at least 4 times per week  The patient's NIHSS score is as follows: 0 Mild: 1 - 5 Mild to Moderately Severe: 5 - 14 Severe: 15 - 24 Very Severe: >25  Follow up in 6 months with duplex ultrasound and physical exam based on the patient's carotid intervention.  2. Atrial fibrillation with rapid ventricular response [facility]) The patient had an episode of atrial fibrillation.  She has had a 1 since  episode since she is left the hospital.  She currently is in normal sinus rhythm.  She has an upcoming appointment and visit with cardiology for further risk stratification and treatment.  3. Mixed hyperlipidemia Continue statin as ordered and reviewed, no changes at this time  4. Uncontrolled type 2 diabetes mellitus with hyperglycemia, without long-term current use of insulin (HCC) Maintained via diet and exercise   Current Outpatient Medications on File Prior to Visit  Medication Sig Dispense Refill   acetaminophen (TYLENOL) 160 MG/5ML elixir Take by mouth every 4 (four) hours as needed for fever.     ALPHA LIPOIC ACID PO Take by mouth.     ALPRAZolam (XANAX) 0.5 MG tablet Take 1 tablet (0.5 mg total) by mouth 2 (two) times daily as needed for anxiety. 20 tablet 0   aspirin EC 81 MG tablet Take 1 tablet (81 mg total) by mouth daily. Swallow whole. 150 tablet 2   Berberine Chloride 500 MG CAPS      BIOTIN PO Take by mouth.     blood glucose meter kit and supplies Dispense based on patient and insurance preference. To check sugar once daily. 1 each 0   Blood Glucose Monitoring Suppl DEVI 1 each by Does not apply route in the morning, at noon, and at bedtime. May substitute to any manufacturer covered by patient's insurance. 1 each 0   Cholecalciferol (VITAMIN D) 2000 UNITS CAPS Take 4,000 Units by mouth daily.     clopidogrel (PLAVIX) 75 MG tablet Take 1 tablet (75 mg total) by mouth daily. 30 tablet 6   Cyanocobalamin (VITAMIN B-12) 1000 MCG SUBL Place 1 tablet (1,000 mcg total) under the tongue daily.  0   estradiol (ESTRACE) 0.1 MG/GM  vaginal cream Place 1 Applicatorful vaginally at bedtime.     Glucose Blood (BLOOD GLUCOSE TEST STRIPS) STRP 1 each by In Vitro route daily. May substitute to any manufacturer covered by patient's insurance. 100 strip 4   glucose blood (TRUE METRIX BLOOD GLUCOSE TEST) test strip TEST BLOOD SUGAR ONE TIME DAILY 100 strip 0   Omega-3 Fatty Acids (FISH OIL)  1000 MG CAPS Take 1,000-2,000 mg by mouth at bedtime.      TRUEplus Lancets 33G MISC      meloxicam (MOBIC) 15 MG tablet Take 15 mg by mouth daily. (Patient not taking: Reported on 01/17/2023)     No current facility-administered medications on file prior to visit.    There are no Patient Instructions on file for this visit. No follow-ups on file.   Georgiana Spinner, NP

## 2023-02-16 DIAGNOSIS — M545 Low back pain, unspecified: Secondary | ICD-10-CM | POA: Diagnosis not present

## 2023-02-21 NOTE — Progress Notes (Unsigned)
Cardiology Office Note  Date:  02/23/2023   ID:  [name], DOB 03/23/1946, MRN [id number]  PCP:  Jacky Kindle, FNP   Chief Complaint  Patient presents with   [facility] follow up     Patient had an Echo and s/p carotid PTA/Stent. Medications reviewed by the patient verbally.     HPI:  Ms. Christine Richards is a 77 year old woman with history of  hyperlipidemia,  type 2 diabetes mellitus, A1c 7.6 TIA,  obstructive sleep apnea,  PAD, carotid artery stenosis status post right carotid artery stenting May 2024 Hyperlipidemia, prefers not to be on statins Seen in the hospital for atrial fibrillation with rapid ventricular response.  Who presents to establish care in the [facility] office for her paroxysmal atrial fibrillation, PAD  Seen by cardiology in the hospital May 2024 bradycardic with heart rates into the upper 30s following carotid stenting. She was placed on dopamine infusion and developed atrial fibrillation with rapid ventricular response overnight. This was accompanied by chest pain radiating into her neck that lasted about an hour. Dopamine was discontinued around 3 AM and amiodarone infusion started with conversion to sinus rhythm this morning. At this time, Ms. Christine Richards feels well. Telemetry shows sinus bradycardia in the upper 50's.   Stress test performed in the hospital showing no significant ischemia, low risk study  Lab work reviewed Total cholesterol 337 triglycerides 241 LDL 238 In terms of lipid management Tried several statin, had myalgias GI issues on zetia Currently taking over-the-counter supplement provided by Dr. Mayford Knife  EKG personally reviewed by myself on todays visit EKG Interpretation  Date/Time:  Wednesday February 23 2023 13:38:04 EDT Ventricular Rate:  70 PR Interval:  168 QRS Duration: 90 QT Interval:  376 QTC Calculation: 406 R Axis:   -40 Text Interpretation: Normal sinus rhythm Nonspecific ST abnormality Compared to previous tracing rhythm has  changed Confirmed by Julien Nordmann [phone]) on 02/23/2023 2:23:49 PM     PMH:   has a past medical history of Arthritis, Carotid arterial disease (HCC), Diabetes mellitus without complication (HCC), Diverticulosis, Environmental and seasonal allergies, Hyperlipidemia, Osteopenia, Sciatica (2024), Sinus congestion, Sleep apnea, and TIA (transient ischemic attack).  PSH:    Past Surgical History:  Procedure Laterality Date   BREAST BIOPSY Right 2010   benign   BREAST CYST ASPIRATION Right    BREAST SURGERY Right 2010   biopsy   CAROTID PTA/STENT INTERVENTION Right 01/17/2023   Procedure: CAROTID PTA/STENT INTERVENTION;  Surgeon: Annice Needy, MD;  Location: [facility] INVASIVE CV LAB;  Service: Cardiovascular;  Laterality: Right;   CATARACT EXTRACTION W/PHACO Right 11/11/2016   Procedure: CATARACT EXTRACTION PHACO AND INTRAOCULAR LENS PLACEMENT (IOC);  Surgeon: Nevada Crane, MD;  Location: [facility] ORS;  Service: Ophthalmology;  Laterality: Right;  [place] 1:04.9 AP% 14.6 CDE 9.64 Fluid pack lot # [id number] H   CATARACT EXTRACTION W/PHACO Left 09/26/2019   Procedure: CATARACT EXTRACTION PHACO AND INTRAOCULAR LENS PLACEMENT (IOC) LEFT DIABETIC SYMFONY LENS 12.82 01:22.2 15.6%;  Surgeon: Lockie Mola, MD;  Location: [facility];  Service: Ophthalmology;  Laterality: Left;   ESOPHAGOGASTRODUODENOSCOPY (EGD) WITH PROPOFOL N/A 12/29/2015   Procedure: ESOPHAGOGASTRODUODENOSCOPY (EGD) WITH PROPOFOL;  Surgeon: Midge Minium, MD;  Location: [facility];  Service: Endoscopy;  Laterality: N/A;  GASTRI BIOPSY   TUBAL LIGATION      Current Outpatient Medications  Medication Sig Dispense Refill   acetaminophen (TYLENOL) 160 MG/5ML elixir Take by mouth every 4 (four) hours as needed for fever.  ALPHA LIPOIC ACID PO Take by mouth.     ALPRAZolam (XANAX) 0.5 MG tablet Take 1 tablet (0.5 mg total) by mouth 2 (two) times daily as needed for anxiety. 20 tablet 0   aspirin EC 81 MG tablet Take 1  tablet (81 mg total) by mouth daily. Swallow whole. 150 tablet 2   Berberine Chloride 500 MG CAPS      BIOTIN PO Take by mouth.     blood glucose meter kit and supplies Dispense based on patient and insurance preference. To check sugar once daily. 1 each 0   Blood Glucose Monitoring Suppl DEVI 1 each by Does not apply route in the morning, at noon, and at bedtime. May substitute to any manufacturer covered by patient's insurance. 1 each 0   Cholecalciferol (VITAMIN D) 2000 UNITS CAPS Take 4,000 Units by mouth daily.     clopidogrel (PLAVIX) 75 MG tablet Take 1 tablet (75 mg total) by mouth daily. 30 tablet 6   Cyanocobalamin (VITAMIN B-12) 1000 MCG SUBL Place 1 tablet (1,000 mcg total) under the tongue daily.  0   estradiol (ESTRACE) 0.1 MG/GM vaginal cream Place 1 Applicatorful vaginally at bedtime.     Glucose Blood (BLOOD GLUCOSE TEST STRIPS) STRP 1 each by In Vitro route daily. May substitute to any manufacturer covered by patient's insurance. 100 strip 4   glucose blood (TRUE METRIX BLOOD GLUCOSE TEST) test strip TEST BLOOD SUGAR ONE TIME DAILY 100 strip 0   Omega-3 Fatty Acids (FISH OIL) 1000 MG CAPS Take 1,000-2,000 mg by mouth at bedtime.      TRUEplus Lancets 33G MISC      meloxicam (MOBIC) 15 MG tablet Take 15 mg by mouth daily. (Patient not taking: Reported on 01/17/2023)     No current facility-administered medications for this visit.    Allergies:   Antihistamines, chlorpheniramine-type; Macrobid [nitrofurantoin macrocrystal]; and Statins   Social History:  The patient  reports that she has never smoked. She has never used smokeless tobacco. She reports that she does not drink alcohol and does not use drugs.   Family History:   family history includes Atrial fibrillation in her sister; Congestive Heart Failure in her mother; Dementia in her father; Heart attack in her father and mother; Heart disease in her brother; Hyperlipidemia in her sister; Prostate cancer in her father.     Review of Systems: Review of Systems  Constitutional: Negative.   HENT: Negative.    Respiratory: Negative.    Cardiovascular: Negative.   Gastrointestinal: Negative.   Musculoskeletal: Negative.   Neurological: Negative.   Psychiatric/Behavioral: Negative.    All other systems reviewed and are negative.    PHYSICAL EXAM: VS:  BP 120/62 (BP Location: Left Arm, Patient Position: Sitting, Cuff Size: Normal)   Pulse 70   Ht 5' 5.5" (1.664 m)   Wt 152 lb 4 oz (69.1 kg)   SpO2 94%   BMI 24.95 kg/m  , BMI Body mass index is 24.95 kg/m. GEN: Well nourished, well developed, in no acute distress HEENT: normal Neck: no JVD, carotid bruits, or masses Cardiac: RRR; no murmurs, rubs, or gallops,no edema  Respiratory:  clear to auscultation bilaterally, normal work of breathing GI: soft, nontender, nondistended, + BS MS: no deformity or atrophy Skin: warm and dry, no rash Neuro:  Strength and sensation are intact Psych: euthymic mood, full affect   Recent Labs: 11/29/2022: ALT 18 01/19/2023: TSH 4.516 01/20/2023: BUN 28; Creatinine, Ser 0.61; Hemoglobin 10.7; Platelets 183; Potassium 3.7;  Sodium 137    Lipid Panel Lab Results  Component Value Date   CHOL 337 (H) 11/29/2022   HDL 50 11/29/2022   LDLCALC 238 (H) 11/29/2022   TRIG 241 (H) 11/29/2022      Wt Readings from Last 3 Encounters:  02/23/23 152 lb 4 oz (69.1 kg)  02/11/23 150 lb (68 kg)  01/17/23 150 lb 9.2 oz (68.3 kg)       ASSESSMENT AND PLAN:  Problem List Items Addressed This Visit       Cardiology Problems   Mixed hyperlipidemia   Atrial fibrillation with rapid ventricular response (HCC) - Primary   Relevant Orders   EKG 12-Lead (Completed)   Sinus bradycardia   Relevant Orders   EKG 12-Lead (Completed)   Carotid stenosis     Other   Uncontrolled type 2 diabetes mellitus with hyperglycemia, without long-term current use of insulin (HCC)   OSA on CPAP   Other Visit Diagnoses     PAD  (peripheral artery disease) (HCC)          PAD Severe carotid stenosis on left, string sign, stent placed 50% stenosis on right on CT scan Stressed importance of aggressive lipid management and diabetes control Aortic atherosclerosis noted on CT scan  Paroxysmal atrial fibrillation Lone episode after receiving dopamine in the hospital in the setting of stent placement to carotid and bradycardia Converting quickly back to normal sinus rhythm Recommend she use her watch to track for heart arrhythmia  Hyperlipidemia Long discussion concerning various treatment options, has statin myalgias, GI issues on Zetia Recommend she start Repatha subcu 140 mg every 2 weeks Also discussed possible referral to lipid clinic Could consider Leqvio  Diabetes type 2 with complications Stressed the importance of working closely with primary care to get A1c into the low 6 range.  Carbohydrate restriction, walking program  Coronary artery disease with stable angina Stress test done in the hospital showing no significant ischemia, coronary calcification of the RCA noted as well as aortic atherosclerosis   Total encounter time more than 40 minutes  Greater than 50% was spent in counseling and coordination of care with the patient    Signed, Dossie Arbour, M.D., Ph.D. [facility] [place], [place] [phone]

## 2023-02-23 ENCOUNTER — Ambulatory Visit: Payer: Medicare HMO | Attending: Cardiology | Admitting: Cardiovascular Disease

## 2023-02-23 ENCOUNTER — Ambulatory Visit: Payer: Medicare HMO | Admitting: Cardiology

## 2023-02-23 ENCOUNTER — Encounter: Payer: Self-pay | Admitting: Cardiovascular Disease

## 2023-02-23 VITALS — BP 120/62 | HR 70 | Ht 65.5 in | Wt 152.2 lb

## 2023-02-23 DIAGNOSIS — E782 Mixed hyperlipidemia: Secondary | ICD-10-CM | POA: Diagnosis not present

## 2023-02-23 DIAGNOSIS — I4891 Unspecified atrial fibrillation: Secondary | ICD-10-CM | POA: Diagnosis not present

## 2023-02-23 DIAGNOSIS — M791 Myalgia, unspecified site: Secondary | ICD-10-CM

## 2023-02-23 DIAGNOSIS — I739 Peripheral vascular disease, unspecified: Secondary | ICD-10-CM | POA: Diagnosis not present

## 2023-02-23 DIAGNOSIS — R001 Bradycardia, unspecified: Secondary | ICD-10-CM | POA: Diagnosis not present

## 2023-02-23 DIAGNOSIS — Z79899 Other long term (current) drug therapy: Secondary | ICD-10-CM | POA: Diagnosis not present

## 2023-02-23 DIAGNOSIS — G4733 Obstructive sleep apnea (adult) (pediatric): Secondary | ICD-10-CM | POA: Diagnosis not present

## 2023-02-23 DIAGNOSIS — I6523 Occlusion and stenosis of bilateral carotid arteries: Secondary | ICD-10-CM | POA: Diagnosis not present

## 2023-02-23 DIAGNOSIS — E1165 Type 2 diabetes mellitus with hyperglycemia: Secondary | ICD-10-CM

## 2023-02-23 DIAGNOSIS — T466X5A Adverse effect of antihyperlipidemic and antiarteriosclerotic drugs, initial encounter: Secondary | ICD-10-CM | POA: Diagnosis not present

## 2023-02-23 MED ORDER — REPATHA SURECLICK 140 MG/ML ~~LOC~~ SOAJ: 140.0000 mg | SUBCUTANEOUS | 3 refills | Status: DC

## 2023-02-23 NOTE — Patient Instructions (Addendum)
Medication Instructions:  Repatha 140 sq every 2 weeks   If you need a refill on your cardiac medications before your next appointment, please call your pharmacy.   Lab work: No new labs needed  Testing/Procedures: No new testing needed  Follow-Up: At [facility], you and your health needs are our priority.  As part of our continuing mission to provide you with exceptional heart care, we have created designated Provider Care Teams.  These Care Teams include your primary Cardiologist (physician) and Advanced Practice Providers (APPs -  Physician Assistants and Nurse Practitioners) who all work together to provide you with the care you need, when you need it.  You will need a follow up appointment in 6 months  Providers on your designated Care Team:   Nicolasa Ducking, NP Eula Listen, PA-C Cadence Fransico Michael, [place]  COVID-19 Vaccine Information can be found at: [email or web address] For questions related to vaccine distribution or appointments, please email [email or web address] or call 4791776528.

## 2023-03-07 ENCOUNTER — Encounter: Payer: Self-pay | Admitting: Obstetrics and Gynecology

## 2023-03-07 ENCOUNTER — Ambulatory Visit: Payer: Medicare HMO | Admitting: Obstetrics and Gynecology

## 2023-03-07 VITALS — BP 126/78 | HR 72

## 2023-03-07 DIAGNOSIS — N812 Incomplete uterovaginal prolapse: Secondary | ICD-10-CM

## 2023-03-07 DIAGNOSIS — Z4689 Encounter for fitting and adjustment of other specified devices: Secondary | ICD-10-CM

## 2023-03-07 DIAGNOSIS — N8110 Cystocele, unspecified: Secondary | ICD-10-CM

## 2023-03-07 DIAGNOSIS — N816 Rectocele: Secondary | ICD-10-CM

## 2023-03-07 NOTE — Progress Notes (Signed)
[facility]   Subjective:     Chief Complaint:  Chief Complaint  Patient presents with   Pessary Check   History of Present Illness: Ardeen Nosko is a 77 y.o. female with stage IV pelvic organ prolapse who presents for a pessary check. She is using a size 2 3/4in  long stem gellhorn pessary. The pessary has been working well and she has no complaints. She is using vaginal estrogen. She denies vaginal bleeding.  Past Medical History: Patient  has a past medical history of Arthritis, Carotid arterial disease (HCC), Diabetes mellitus without complication (HCC), Diverticulosis, Environmental and seasonal allergies, Hyperlipidemia, Osteopenia, Sciatica (2024), Sinus congestion, Sleep apnea, and TIA (transient ischemic attack).   Past Surgical History: She  has a past surgical history that includes Tubal ligation; Breast surgery (Right, 2010); Esophagogastroduodenoscopy (egd) with propofol (N/A, 12/29/2015); Cataract extraction w/PHACO (Right, 11/11/2016); Breast cyst aspiration (Right); Cataract extraction w/PHACO (Left, 09/26/2019); Breast biopsy (Right, 2010); and CAROTID PTA/STENT INTERVENTION (Right, 01/17/2023).   Medications: She has a current medication list which includes the following prescription(s): acetaminophen, alpha-lipoic acid, aspirin ec, berberine chloride, biotin, blood glucose meter kit and supplies, blood glucose monitoring suppl, vitamin d, clopidogrel, vitamin b-12, estradiol, repatha sureclick, fish oil, trueplus lancets 33g, alprazolam, blood glucose test strips, true metrix blood glucose test, and meloxicam.   Allergies: Patient is allergic to antihistamines, chlorpheniramine-type; macrobid [nitrofurantoin macrocrystal]; and statins.   Social History: Patient  reports that she has never smoked. She has never used smokeless tobacco. She reports that she does not drink alcohol and does not use drugs.      Objective:    Physical Exam: BP 126/78   Pulse  72  Gen: No apparent distress, A&O x 3. Detailed Urogynecologic Evaluation:  Pelvic Exam: Normal external female genitalia; Bartholin's and Skene's glands normal in appearance; urethral meatus normal in appearance, no urethral masses or discharge. The pessary was noted to be in place. It was removed and cleaned. Speculum exam revealed no lesions in the vagina. The pessary was replaced. It was comfortable to the patient and fit well.     Assessment/Plan:    Assessment: Ms. Harpold is a 77 y.o. with stage IV pelvic organ prolapse here for a pessary check. She is doing well.  Plan: She will keep the pessary in place until next visit. She will continue to use estrogen. She will follow-up in 3 months for a pessary check or sooner as needed.  All questions were answered.

## 2023-03-11 ENCOUNTER — Telehealth: Payer: Self-pay

## 2023-03-11 ENCOUNTER — Other Ambulatory Visit (HOSPITAL_COMMUNITY): Payer: Self-pay

## 2023-03-11 NOTE — Telephone Encounter (Signed)
Pharmacy Patient Advocate Encounter  Received notification from [facility] that Prior Authorization for REPATHA has been APPROVED from 08/30/22 to 08/30/23.Marland Kitchen

## 2023-03-11 NOTE — Telephone Encounter (Signed)
Pharmacy Patient Advocate Encounter   Received notification from CoverMyMeds that prior authorization for REPATHA is required/requested.   Insurance verification completed.   The patient is insured through [facility] .   PA submitted to [facility] via CoverMyMeds Key/confirmation #/EOC [id number] Status is pending

## 2023-04-12 DIAGNOSIS — M545 Low back pain, unspecified: Secondary | ICD-10-CM | POA: Diagnosis not present

## 2023-05-11 ENCOUNTER — Other Ambulatory Visit (INDEPENDENT_AMBULATORY_CARE_PROVIDER_SITE_OTHER): Payer: Self-pay | Admitting: Nurse Practitioner

## 2023-05-11 DIAGNOSIS — I6523 Occlusion and stenosis of bilateral carotid arteries: Secondary | ICD-10-CM

## 2023-05-12 DIAGNOSIS — E119 Type 2 diabetes mellitus without complications: Secondary | ICD-10-CM | POA: Diagnosis not present

## 2023-05-12 DIAGNOSIS — H43813 Vitreous degeneration, bilateral: Secondary | ICD-10-CM | POA: Diagnosis not present

## 2023-05-12 DIAGNOSIS — D3131 Benign neoplasm of right choroid: Secondary | ICD-10-CM | POA: Diagnosis not present

## 2023-05-12 DIAGNOSIS — H353131 Nonexudative age-related macular degeneration, bilateral, early dry stage: Secondary | ICD-10-CM | POA: Diagnosis not present

## 2023-05-12 LAB — HM DIABETES EYE EXAM

## 2023-05-17 ENCOUNTER — Ambulatory Visit (INDEPENDENT_AMBULATORY_CARE_PROVIDER_SITE_OTHER): Payer: Medicare HMO

## 2023-05-17 ENCOUNTER — Ambulatory Visit (INDEPENDENT_AMBULATORY_CARE_PROVIDER_SITE_OTHER): Payer: Medicare HMO | Admitting: Vascular Surgery

## 2023-05-17 DIAGNOSIS — I6523 Occlusion and stenosis of bilateral carotid arteries: Secondary | ICD-10-CM

## 2023-05-19 ENCOUNTER — Telehealth (INDEPENDENT_AMBULATORY_CARE_PROVIDER_SITE_OTHER): Payer: Self-pay

## 2023-05-19 NOTE — Telephone Encounter (Signed)
Pt LVM stating that she has been on Plavix for about 4 months and it is bothering her stomach and causing problems, she needs to get off this medication and would like to be advised as what she needs to do? Please advise

## 2023-05-23 ENCOUNTER — Other Ambulatory Visit: Payer: Self-pay

## 2023-05-23 ENCOUNTER — Other Ambulatory Visit (HOSPITAL_COMMUNITY): Payer: Self-pay

## 2023-05-23 MED ORDER — ESTRADIOL 0.1 MG/GM VA CREA: 1.0000 | TOPICAL_CREAM | Freq: Every day | VAGINAL | 4 refills | Status: DC

## 2023-06-13 ENCOUNTER — Encounter: Payer: Self-pay | Admitting: Obstetrics and Gynecology

## 2023-06-13 ENCOUNTER — Ambulatory Visit: Payer: Medicare HMO | Admitting: Obstetrics and Gynecology

## 2023-06-13 VITALS — BP 125/70 | HR 67

## 2023-06-13 DIAGNOSIS — N816 Rectocele: Secondary | ICD-10-CM

## 2023-06-13 DIAGNOSIS — N812 Incomplete uterovaginal prolapse: Secondary | ICD-10-CM

## 2023-06-13 DIAGNOSIS — N813 Complete uterovaginal prolapse: Secondary | ICD-10-CM

## 2023-06-13 DIAGNOSIS — N8110 Cystocele, unspecified: Secondary | ICD-10-CM

## 2023-06-13 MED ORDER — TRIMO-SAN 0.025 % VA GEL: 1.0000 | Freq: Every evening | VAGINAL | 0 refills | Status: AC | PRN

## 2023-06-13 NOTE — Progress Notes (Signed)
[facility]   Subjective:     Chief Complaint:  Chief Complaint  Patient presents with   Pessary Check    Christine Richards is a 77 y.o. female is here for 3 month pessary check.   History of Present Illness: Christine Richards is a 77 y.o. female with stage IV pelvic organ prolapse who presents for a pessary check. She is using a size 2 3/4in short stem gellhorn pessary. The pessary has been working well and she has no complaints. She is using vaginal estrogen. She denies vaginal bleeding but reports the odor bothers her from the pessary.   Past Medical History: Patient  has a past medical history of Arthritis, Carotid arterial disease (HCC), Diabetes mellitus without complication (HCC), Diverticulosis, Environmental and seasonal allergies, Hyperlipidemia, Osteopenia, Sciatica (2024), Sinus congestion, Sleep apnea, and TIA (transient ischemic attack).   Past Surgical History: She  has a past surgical history that includes Tubal ligation; Breast surgery (Right, 2010); Esophagogastroduodenoscopy (egd) with propofol (N/A, 12/29/2015); Cataract extraction w/PHACO (Right, 11/11/2016); Breast cyst aspiration (Right); Cataract extraction w/PHACO (Left, 09/26/2019); Breast biopsy (Right, 2010); and CAROTID PTA/STENT INTERVENTION (Right, 01/17/2023).   Medications: She has a current medication list which includes the following prescription(s): acetaminophen, alpha-lipoic acid, aspirin ec, berberine chloride, biotin, blood glucose meter kit and supplies, blood glucose monitoring suppl, vitamin d, clopidogrel, vitamin b-12, estradiol, fish oil, red yeast rice extract, and trueplus lancets 33g.   Allergies: Patient is allergic to antihistamines, chlorpheniramine-type; macrobid [nitrofurantoin macrocrystal]; and statins.   Social History: Patient  reports that she has never smoked. She has never used smokeless tobacco. She reports that she does not drink alcohol and does not use drugs.       Objective:    Physical Exam: BP 125/70   Pulse 67  Gen: No apparent distress, A&O x 3. Detailed Urogynecologic Evaluation:  Pelvic Exam: Normal external female genitalia; Bartholin's and Skene's glands normal in appearance; urethral meatus normal in appearance, no urethral masses or discharge. The pessary was noted to be in place. It was removed and cleaned. Speculum exam revealed no lesions in the vagina. The pessary was replaced. It was comfortable to the patient and fit well.      Assessment/Plan:    Assessment: Christine Richards is a 77 y.o. with stage IV pelvic organ prolapse here for a pessary check. She is doing well.  Plan: She will keep the pessary in place until next visit. She will continue to use estrogen and can start using Trimo-San jelly on the nights she is not using estrogen.  She will follow-up in 3 months for a pessary check or sooner as needed.  All questions were answered.

## 2023-06-16 ENCOUNTER — Encounter: Payer: Self-pay | Admitting: Emergency Medicine

## 2023-07-18 ENCOUNTER — Telehealth (INDEPENDENT_AMBULATORY_CARE_PROVIDER_SITE_OTHER): Payer: Self-pay

## 2023-07-18 DIAGNOSIS — R69 Illness, unspecified: Secondary | ICD-10-CM | POA: Diagnosis not present

## 2023-07-18 NOTE — Telephone Encounter (Signed)
Patient left a voicemail stating that she has been in Plavix for the past 6 months. Patient is requesting verification if she can stop taking Plavix. Patient last visit was on 02/11/2023. Please Advise

## 2023-07-18 NOTE — Telephone Encounter (Signed)
Patient states that she started taken aspirin several years ago and started having acid reflux. Patient had endoscopy done due to the irritation. Patient unsure about taking aspirin. Please Advise

## 2023-07-19 ENCOUNTER — Other Ambulatory Visit (INDEPENDENT_AMBULATORY_CARE_PROVIDER_SITE_OTHER): Payer: Self-pay | Admitting: Vascular Surgery

## 2023-07-19 NOTE — Telephone Encounter (Signed)
Patient notified with medical recommendations and verbalized understanding 

## 2023-08-22 ENCOUNTER — Ambulatory Visit: Payer: Medicare HMO | Admitting: Cardiovascular Disease

## 2023-08-29 DIAGNOSIS — R69 Illness, unspecified: Secondary | ICD-10-CM | POA: Diagnosis not present

## 2023-09-05 ENCOUNTER — Telehealth: Payer: Self-pay | Admitting: Family Medicine

## 2023-09-05 NOTE — Telephone Encounter (Signed)
 Request reschedule appointment- may need to be rescheduled with another provider

## 2023-09-05 NOTE — Telephone Encounter (Signed)
 Pt is calling to reschedule AWV - No other appts in 2024. Please advise [phone]- [phone]

## 2023-09-06 NOTE — Telephone Encounter (Signed)
 I spoke to patient and rescheduled appointment to 11/09/2023 for a video visit.

## 2023-09-12 ENCOUNTER — Ambulatory Visit: Payer: Medicare HMO | Admitting: Obstetrics and Gynecology

## 2023-09-14 ENCOUNTER — Ambulatory Visit: Payer: Medicare Other | Admitting: Obstetrics and Gynecology

## 2023-09-14 ENCOUNTER — Other Ambulatory Visit (HOSPITAL_COMMUNITY)
Admission: RE | Admit: 2023-09-14 | Discharge: 2023-09-14 | Disposition: A | Discharge status: Home / Self Care | Payer: Medicare Other | Source: Ambulatory Visit | Attending: Obstetrics and Gynecology | Admitting: Obstetrics and Gynecology

## 2023-09-14 ENCOUNTER — Encounter: Payer: Self-pay | Admitting: Obstetrics and Gynecology

## 2023-09-14 VITALS — BP 116/66 | HR 69

## 2023-09-14 DIAGNOSIS — N898 Other specified noninflammatory disorders of vagina: Secondary | ICD-10-CM

## 2023-09-14 DIAGNOSIS — N813 Complete uterovaginal prolapse: Secondary | ICD-10-CM | POA: Diagnosis not present

## 2023-09-14 DIAGNOSIS — N812 Incomplete uterovaginal prolapse: Secondary | ICD-10-CM

## 2023-09-14 DIAGNOSIS — N8110 Cystocele, unspecified: Secondary | ICD-10-CM

## 2023-09-14 DIAGNOSIS — N816 Rectocele: Secondary | ICD-10-CM

## 2023-09-14 DIAGNOSIS — R151 Fecal smearing: Secondary | ICD-10-CM

## 2023-09-14 NOTE — Patient Instructions (Addendum)
 For your pelvic floor PT there is not a provider at [facility].   I have sent it to [facility] and the provider Lerry Ransom   We will call you with the results of your swab if there is signs of yeast.   We will plan to see you in 3 months for your pessary check or sooner if needed.   Would also suggest Metamucil for stool bulking if your stools are too soft and causing some of the leakage.

## 2023-09-14 NOTE — Progress Notes (Signed)
 [facility]   Subjective:     Chief Complaint:  Chief Complaint  Patient presents with   Pessary Check    Christine Richards is a 78 y.o. female is here for pessary check.   History of Present Illness: Christine Richards is a 78 y.o. female with stage IV pelvic organ prolapse who presents for a pessary check. She is using a size 2 3/4in short stem gellhorn pessary. The pessary has been working well and she has no complaints. She is using vaginal estrogen. She denies vaginal bleeding.  Patient reports in the last few weeks/month she has been having more frequent fecal smearing. She states the only change is that 10 days ago she stopped her CardioMiracle drink as it was causing issues with her blood sugars  Past Medical History: Patient  has a past medical history of Arthritis, Carotid arterial disease (HCC), Diabetes mellitus without complication (HCC), Diverticulosis, Environmental and seasonal allergies, Hyperlipidemia, Osteopenia, Sciatica (2024), Sinus congestion, Sleep apnea, and TIA (transient ischemic attack).   Past Surgical History: She  has a past surgical history that includes Tubal ligation; Breast surgery (Right, 2010); Esophagogastroduodenoscopy (egd) with propofol  (N/A, 12/29/2015); Cataract extraction w/PHACO (Right, 11/11/2016); Breast cyst aspiration (Right); Cataract extraction w/PHACO (Left, 09/26/2019); Breast biopsy (Right, 2010); and CAROTID PTA/STENT INTERVENTION (Right, 01/17/2023).   Medications: She has a current medication list which includes the following prescription(s): acetaminophen , alpha-lipoic acid, aspirin  ec, berberine chloride, biotin, blood glucose meter kit and supplies, blood glucose monitoring suppl, vitamin d , clopidogrel , vitamin b-12, estradiol , fish oil, trimo-san, red yeast rice extract, and trueplus lancets 33g.   Allergies: Patient is allergic to antihistamines, chlorpheniramine-type; macrobid [nitrofurantoin macrocrystal]; and statins.    Social History: Patient  reports that she has never smoked. She has never used smokeless tobacco. She reports that she does not drink alcohol and does not use drugs.      Objective:    Physical Exam: BP 116/66   Pulse 69  Gen: No apparent distress, A&O x 3. Detailed Urogynecologic Evaluation:  Pelvic Exam: Normal external female genitalia; Bartholin's and Skene's glands normal in appearance; urethral meatus normal in appearance, no urethral masses or discharge. The pessary was noted to be in place. It was removed and cleaned. Speculum exam revealed no lesions in the vagina but the labia appear red and irritated, aptima swab obtained. The pessary was replaced. It was comfortable to the patient and fit well.   A rectal exam was preformed with chaperone present; patient has dyssynergia when asking her to bear down. Also patient has poor pelvic floor muscle strength when instructed to squeeze around finger or attempt kegel.     Assessment/Plan:    Assessment: [name] is a 78 y.o. with stage IV pelvic organ prolapse here for a pessary check. She is doing well.  Plan: She will keep the pessary in place until next visit. She will continue to use estrogen. She will follow-up in 3 months for a pessary check or sooner as needed.   Referral sent for pelvic floor PT to assist in fecal smearing.   All questions were answered.

## 2023-09-15 LAB — CERVICOVAGINAL ANCILLARY ONLY
Bacterial Vaginitis (gardnerella): NEGATIVE
Candida Glabrata: NEGATIVE
Candida Vaginitis: NEGATIVE
Comment: NEGATIVE
Comment: NEGATIVE
Comment: NEGATIVE

## 2023-09-16 ENCOUNTER — Encounter: Payer: Self-pay | Admitting: Obstetrics and Gynecology

## 2023-09-26 ENCOUNTER — Ambulatory Visit (INDEPENDENT_AMBULATORY_CARE_PROVIDER_SITE_OTHER): Payer: Medicare Other | Admitting: Family Medicine

## 2023-09-26 ENCOUNTER — Encounter: Payer: Self-pay | Admitting: Family Medicine

## 2023-09-26 VITALS — BP 129/61 | HR 61 | Resp 18 | Ht 65.5 in | Wt 159.1 lb

## 2023-09-26 DIAGNOSIS — E538 Deficiency of other specified B group vitamins: Secondary | ICD-10-CM | POA: Diagnosis not present

## 2023-09-26 DIAGNOSIS — E1169 Type 2 diabetes mellitus with other specified complication: Secondary | ICD-10-CM | POA: Diagnosis not present

## 2023-09-26 DIAGNOSIS — S12031K Nondisplaced posterior arch fracture of first cervical vertebra, subsequent encounter for fracture with nonunion: Secondary | ICD-10-CM | POA: Diagnosis not present

## 2023-09-26 DIAGNOSIS — M6289 Other specified disorders of muscle: Secondary | ICD-10-CM | POA: Diagnosis not present

## 2023-09-26 DIAGNOSIS — E1165 Type 2 diabetes mellitus with hyperglycemia: Secondary | ICD-10-CM | POA: Diagnosis not present

## 2023-09-26 DIAGNOSIS — Z0001 Encounter for general adult medical examination with abnormal findings: Secondary | ICD-10-CM

## 2023-09-26 DIAGNOSIS — E785 Hyperlipidemia, unspecified: Secondary | ICD-10-CM | POA: Diagnosis not present

## 2023-09-26 DIAGNOSIS — M85832 Other specified disorders of bone density and structure, left forearm: Secondary | ICD-10-CM

## 2023-09-26 DIAGNOSIS — I480 Paroxysmal atrial fibrillation: Secondary | ICD-10-CM | POA: Diagnosis not present

## 2023-09-26 DIAGNOSIS — Z0000 Encounter for general adult medical examination without abnormal findings: Secondary | ICD-10-CM

## 2023-09-26 DIAGNOSIS — E559 Vitamin D deficiency, unspecified: Secondary | ICD-10-CM

## 2023-09-26 DIAGNOSIS — R591 Generalized enlarged lymph nodes: Secondary | ICD-10-CM | POA: Diagnosis not present

## 2023-09-26 NOTE — Assessment & Plan Note (Signed)
Physical exam overall unremarkable except as noted above. Routine lab work ordered as noted. Generally healthy, exercises regularly, and follows a relatively healthy diet. She does declines all vaccines. Discussed ordering a metabolic panel, vitamin D and U98 levels, and performing a foot exam.   - Order metabolic panel   - Order vitamin D and B12 levels   - Perform foot exam

## 2023-09-26 NOTE — Assessment & Plan Note (Signed)
History of deficiency. Recheck levels.

## 2023-09-26 NOTE — Assessment & Plan Note (Signed)
Adverse effects from statins. She has tried multiple medications with reported adverse effects. Cardiologist recommended Repatha, which she declined. Reported using Cardio Miracle supplement, which she believes caused an increase in blood sugar levels and weight gain.  - Order cholesterol panel

## 2023-09-26 NOTE — Progress Notes (Signed)
Complete physical exam   Patient: Christine Richards.   DOB: September 07, 1945   78 y.o. Female  MRN: [id number] Visit Date: 09/26/2023  Today's healthcare provider: Sherlyn Hay, DO   Chief Complaint  Patient presents with   Annual Exam   Subjective    Christine Richards is a 78 y.o. female who presents today for a complete physical exam.  She reports consuming a general diet but does try reduce carbs and have a good level of protein. Generally avoids dessert foods; only drinks water, coffee and green tea. Gym/ health club routine includes cardio, light weights, and body-weight exercises. She generally feels well. She reports sleeping well. She does not have additional problems to discuss today.  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 She reports feeling well overall, with no significant health concerns. She sleeps for seven to nine hours a night and exercises four to five times a week, performing moderate to heavy weightlifting. Her diet is generally healthy, with a focus on protein and limited carbohydrate and sugar intake.  The patient has a history of an unhealed traumatic fracture of the C1 vertebra, discovered several months ago during a CT scan. She is unsure of when the injury occurred, but suspects it may have been during a fall while performing agility drills. The patient reports no neck pain, weakness, numbness, or tingling. She expresses concern about the potential for paralysis due to the fracture and requests further explanation and evaluation.  She notes that the issue seems to worsen about a month before her scheduled pessary cleaning and improves after the cleaning. She has been referred to a pelvic floor physical therapist but has not yet attended any sessions.  The patient has a history of elevated blood sugar levels, which she believes may have been influenced by a supplement called Cardio Miracle. She reports that her blood sugar  levels increased dramatically while taking the supplement and returned to normal levels after discontinuing its use. She also gained 11 pounds during this period.  The patient has a history of sleep apnea. She reports occasional episodes of feeling "choked" and feeling like she stops breathing, particularly when sitting and watching TV. She also reports some numbness in her toes, which a previous podiatrist attributed to her bunion rather than neuropathy. The patient had not been taking her vitamin D and B12 supplements regularly, as she believed the Cardio Miracle supplement provided sufficient amounts of these vitamins. She restarted the vitamin D but not the B12, but she plans to restart the B12 going forward.   Past Medical History:  Diagnosis Date   Arthritis    rheumatoid   Carotid arterial disease (HCC)    Diabetes mellitus without complication (HCC)    Diverticulosis    Environmental and seasonal allergies    Hyperlipidemia    Osteopenia    Sciatica 2024   Sinus congestion    Sleep apnea    uses CPAP   TIA (transient ischemic attack)    Past Surgical History:  Procedure Laterality Date   BREAST BIOPSY Right 2010   benign   BREAST CYST ASPIRATION Right    BREAST SURGERY Right 2010   biopsy   CAROTID PTA/STENT INTERVENTION Right 01/17/2023   Procedure: CAROTID PTA/STENT INTERVENTION;  Surgeon: Annice Needy, MD;  Location: [facility]IVE [facility];  Service: Cardiovascular;  Laterality: Right;   CATARACT EXTRACTION W/PHACO Right 11/11/2016   Procedure: CATARACT EXTRACTION PHACO AND  INTRAOCULAR LENS PLACEMENT (IOC);  Surgeon: Nevada Crane, MD;  Location: [facility] ORS;  Service: Ophthalmology;  Laterality: Right;  [place] 1:04.9 AP% 14.6 CDE 9.64 Fluid pack lot # [id number]   CATARACT EXTRACTION W/PHACO Left 09/26/2019   Procedure: CATARACT EXTRACTION PHACO AND INTRAOCULAR LENS PLACEMENT (IOC) LEFT DIABETIC SYMFONY LENS 12.82 01:22.2 15.6%;  Surgeon: Lockie Mola, MD;  Location:  [facility];  Service: Ophthalmology;  Laterality: Left;   ESOPHAGOGASTRODUODENOSCOPY (EGD) WITH PROPOFOL N/A 12/29/2015   Procedure: ESOPHAGOGASTRODUODENOSCOPY (EGD) WITH PROPOFOL;  Surgeon: Midge Minium, MD;  Location: [facility];  Service: Endoscopy;  Laterality: N/A;  GASTRI BIOPSY   TUBAL LIGATION     Social History   Socioeconomic History   Marital status: Widowed    Spouse name: Not on file   Number of children: 2   Years of education: College   Highest education level: Bachelor's degree (e.g., BA, AB, BS)  Occupational History   Occupation: [employer]    Comment: Full Time  Tobacco Use   Smoking status: Never   Smokeless tobacco: Never  Vaping Use   Vaping status: Never Used  Substance and Sexual Activity   Alcohol use: No   Drug use: No   Sexual activity: Not Currently  Other Topics Concern   Not on file  Social History Narrative   2 biological children and 1 step child   Social Drivers of Corporate investment banker Strain: Low Risk  (11/02/2021)   Overall Financial Resource Strain (CARDIA)    Difficulty of Paying Living Expenses: Not hard at all  Food Insecurity: No Food Insecurity (01/21/2023)   Hunger Vital Sign    Worried About Running Out of Food in the Last Year: Never true    Ran Out of Food in the Last Year: Never true  Transportation Needs: No Transportation Needs (01/21/2023)   PRAPARE - Administrator, Civil Service (Medical): No    Lack of Transportation (Non-Medical): No  Physical Activity: Sufficiently Active (09/10/2021)   Exercise Vital Sign    Days of Exercise per Week: 4 days    Minutes of Exercise per Session: 60 min  Stress: No Stress Concern Present (09/13/2022)   [employer] of Occupational Health - Occupational Stress Questionnaire    Feeling of Stress : Not at all  Social Connections: Moderately Isolated (09/13/2022)   Social Connection and Isolation Panel [NHANES]    Frequency of  Communication with Friends and Family: More than three times a week    Frequency of Social Gatherings with Friends and Family: More than three times a week    Attends Religious Services: More than 4 times per year    Active Member of [employer] or Organizations: No    Attends Banker Meetings: Never    Marital Status: Widowed  Intimate Partner Violence: Not At Risk (01/17/2023)   Humiliation, Afraid, Rape, and Kick questionnaire    Fear of Current or Ex-Partner: No    Emotionally Abused: No    Physically Abused: No    Sexually Abused: No   Family Status  Relation Name Status   Mother  Deceased   Father  Deceased   Sister  Alive   Brother  Deceased at age 92       MVA   Brother  Alive   Brother  Alive   Neg Hx  (Not Specified)  No partnership data on file   Family History  Problem Relation Age of Onset  Congestive Heart Failure Mother    Heart attack Mother    Dementia Father    Heart attack Father    Prostate cancer Father    Atrial fibrillation Sister    Hyperlipidemia Sister    Heart disease Brother    Breast cancer Neg Hx    Allergies  Allergen Reactions   Antihistamines, Chlorpheniramine-Type     Facial swelling    Macrobid [Nitrofurantoin Macrocrystal]     unknown   Statins     Muscle Pain, Weakness     Patient Care Team: Sherlyn Hay, DO as PCP - General (Family Medicine) End, Cristal Deer, MD as PCP - Cardiology (Cardiology) Lockie Mola, MD as Referring Physician (Ophthalmology) Wyn Quaker Marlow Baars, MD as Referring Physician (Vascular Surgery) Gaspar Cola, RPH (Inactive) (Pharmacist)   Medications: Outpatient Medications Prior to Visit  Medication Sig   acetaminophen (TYLENOL) 160 MG/5ML elixir Take by mouth every 4 (four) hours as needed for fever.   ALPHA LIPOIC ACID PO Take by mouth.   Berberine Chloride 500 MG CAPS    BIOTIN PO Take by mouth.   blood glucose meter kit and supplies Dispense based on patient and insurance  preference. To check sugar once daily.   Blood Glucose Monitoring Suppl DEVI 1 each by Does not apply route in the morning, at noon, and at bedtime. May substitute to any manufacturer covered by patient's insurance.   Cholecalciferol (VITAMIN D) 2000 UNITS CAPS Take 4,000 Units by mouth daily.   clopidogrel (PLAVIX) 75 MG tablet TAKE ONE TABLET (75 MG) BY MOUTH EVERY DAY   estradiol (ESTRACE) 0.1 MG/GM vaginal cream Place 1 Applicatorful vaginally at bedtime.   Omega-3 Fatty Acids (FISH OIL) 1000 MG CAPS Take 1,000-2,000 mg by mouth at bedtime.    OXYQUINOLONE SULFATE VAGINAL (TRIMO-SAN) 0.025 % GEL Place 1 Application vaginally at bedtime as needed.   Red Yeast Rice Extract (RED YEAST RICE PO) Take by mouth.   TRUEplus Lancets 33G MISC    [DISCONTINUED] aspirin EC 81 MG tablet Take 1 tablet (81 mg total) by mouth daily. Swallow whole.   Cyanocobalamin (VITAMIN B-12) 1000 MCG SUBL Place 1 tablet (1,000 mcg total) under the tongue daily. (Patient not taking: Reported on 09/26/2023)   No facility-administered medications prior to visit.    Review of Systems  Constitutional:  Negative for chills, fatigue and fever.  HENT:  Positive for trouble swallowing (intermittently becomes "choked"). Negative for congestion, ear pain, rhinorrhea, sneezing and sore throat.   Eyes: Negative.  Negative for pain and redness.  Respiratory:  Negative for cough, shortness of breath and wheezing.   Cardiovascular:  Negative for chest pain and leg swelling.  Gastrointestinal:  Negative for abdominal pain, blood in stool, constipation, diarrhea and nausea.       Intermittent fecal leakage; none in past week.  Endocrine: Negative for polydipsia and polyphagia.  Genitourinary: Negative.  Negative for dysuria, flank pain, hematuria, pelvic pain, vaginal bleeding and vaginal discharge.       +pessary in place  Musculoskeletal:  Negative for arthralgias, back pain, gait problem and joint swelling.  Skin:  Negative for  rash.  Neurological: Negative.  Negative for dizziness, tremors, seizures, weakness, light-headedness, numbness and headaches.  Hematological:  Negative for adenopathy.  Psychiatric/Behavioral: Negative.  Negative for behavioral problems, confusion and dysphoric mood. The patient is not nervous/anxious and is not hyperactive.       Objective    BP 129/61   Pulse 61   Resp 18  Ht 5' 5.5" (1.664 m)   Wt 159 lb 1.6 oz (72.2 kg)   SpO2 100%   BMI 26.07 kg/m    Physical Exam Vitals and nursing note reviewed.  Constitutional:      General: She is awake.     Appearance: Normal appearance.  HENT:     Head: Normocephalic and atraumatic.     Right Ear: Tympanic membrane, ear canal and external ear normal.     Left Ear: Tympanic membrane, ear canal and external ear normal.     Nose: Nose normal.     Mouth/Throat:     Mouth: Mucous membranes are moist.     Pharynx: Oropharynx is clear. No oropharyngeal exudate or posterior oropharyngeal erythema.  Eyes:     General: No scleral icterus.    Extraocular Movements: Extraocular movements intact.     Conjunctiva/sclera: Conjunctivae normal.     Pupils: Pupils are equal, round, and reactive to light.  Neck:     Thyroid: No thyromegaly or thyroid tenderness.  Cardiovascular:     Rate and Rhythm: Normal rate and regular rhythm.     Pulses: Normal pulses.          Dorsalis pedis pulses are 2+ on the right side and 2+ on the left side.       Posterior tibial pulses are 2+ on the right side and 2+ on the left side.     Heart sounds: Normal heart sounds.  Pulmonary:     Effort: Pulmonary effort is normal. No tachypnea, bradypnea or respiratory distress.     Breath sounds: Normal breath sounds. No stridor. No wheezing, rhonchi or rales.  Abdominal:     General: Bowel sounds are normal. There is no distension.     Palpations: Abdomen is soft. There is no mass.     Tenderness: There is no abdominal tenderness. There is no guarding.      Hernia: No hernia is present.  Musculoskeletal:     Cervical back: Normal range of motion and neck supple.     Right lower leg: No edema.     Left lower leg: No edema.     Right foot: Normal range of motion. No deformity, bunion, Charcot foot, foot drop or prominent metatarsal heads.     Left foot: Normal range of motion. No deformity, bunion, Charcot foot, foot drop or prominent metatarsal heads.  Feet:     Right foot:     Protective Sensation: 10 sites tested.  10 sites sensed.     Skin integrity: No ulcer, blister, skin breakdown, erythema, warmth, callus, dry skin or fissure.     Toenail Condition: Right toenails are normal.     Left foot:     Protective Sensation: 10 sites tested.  10 sites sensed.     Skin integrity: No ulcer, blister, skin breakdown, erythema, warmth, callus, dry skin or fissure.     Toenail Condition: Left toenails are normal.  Lymphadenopathy:     Cervical: Cervical adenopathy present.  Skin:    General: Skin is warm and dry.  Neurological:     Mental Status: She is alert and oriented to person, place, and time. Mental status is at baseline.  Psychiatric:        Mood and Affect: Mood normal.        Behavior: Behavior normal.      Last depression screening scores    09/26/2023    8:27 AM 11/29/2022    2:38 PM 09/13/2022  3:56 PM  PHQ 2/9 Scores  PHQ - 2 Score 0 0 0  PHQ- 9 Score 0 0    Last fall risk screening    09/26/2023    8:27 AM  Fall Risk   Falls in the past year? 0  Number falls in past yr: 0  Injury with Fall? 0   Last Audit-C alcohol use screening    09/26/2023    9:14 AM  Alcohol Use Disorder Test (AUDIT)  1. How often do you have a drink containing alcohol? 0  2. How many drinks containing alcohol do you have on a typical day when you are drinking? 0  3. How often do you have six or more drinks on one occasion? 0  AUDIT-C Score 0   A score of 3 or more in women, and 4 or more in men indicates increased risk for alcohol abuse,  EXCEPT if all of the points are from question 1   No results found for any visits on 09/26/23.  Assessment & Plan    Routine Health Maintenance and Physical Exam  Exercise Activities and Dietary recommendations  Goals      DIET - EAT MORE FRUITS AND VEGETABLES     DIET - INCREASE WATER INTAKE     Recommend increasing water intake to 4-6 glasses a day.      Monitor and Manage My Blood Sugar-Diabetes Type 2     Timeframe:  Long-Range Goal Priority:  High Start Date: 02/13/2021                             Expected End Date: 08/15/2022                      Follow Up within 90 days   - check blood sugar at prescribed times - check blood sugar if I feel it is too high or too low - enter blood sugar readings and medication or insulin into daily log    Why is this important?   Checking your blood sugar at home helps to keep it from getting very high or very low.  Writing the results in a diary or log helps the doctor know how to care for you.  Your blood sugar log should have the time, date and the results.  Also, write down the amount of insulin or other medicine that you take.  Other information, like what you ate, exercise done and how you were feeling, will also be helpful.     Notes:         Immunization History  Administered Date(s) Administered   Pneumococcal Conjugate-13 08/06/2015   Td 12/22/2007   Tdap 12/22/2007    Health Maintenance  Topic Date Due   Diabetic kidney evaluation - Urine ACR  02/13/2023   HEMOGLOBIN A1C  05/31/2023   Medicare Annual Wellness (AWV)  09/14/2023   COVID-19 Vaccine (1) 10/12/2023 (Originally 09/05/1950)   INFLUENZA VACCINE  11/28/2023 (Originally 03/31/2023)   Zoster Vaccines- Shingrix (1 of 2) 12/25/2023 (Originally 09/05/1964)   DTaP/Tdap/Td (3 - Td or Tdap) 09/25/2024 (Originally 12/21/2017)   Pneumonia Vaccine 23+ Years old (2 of 2 - PPSV23 or PCV20) 09/25/2024 (Originally 10/01/2015)   MAMMOGRAM  12/22/2023   Diabetic kidney  evaluation - eGFR measurement  01/20/2024   DEXA SCAN  05/02/2024   OPHTHALMOLOGY EXAM  05/11/2024   FOOT EXAM  09/25/2024   Hepatitis C Screening  Completed  HPV VACCINES  Aged Out   Colonoscopy  Discontinued    Discussed health benefits of physical activity, and encouraged her to engage in regular exercise appropriate for her age and condition.   Annual physical exam Assessment & Plan: Physical exam overall unremarkable except as noted above. Routine lab work ordered as noted. Generally healthy, exercises regularly, and follows a relatively healthy diet. She does declines all vaccines. Discussed ordering a metabolic panel, vitamin D and P32 levels, and performing a foot exam.   - Order metabolic panel   - Order vitamin D and B12 levels   - Perform foot exam     Vitamin D deficiency Assessment & Plan: History of deficiency. Recheck levels.  Orders: -     VITAMIN D 25 Hydroxy (Vit-D Deficiency, Fractures)  Vitamin B12 deficiency Assessment & Plan: History of deficiency. Recheck levels.  Orders: -     Vitamin B12  Uncontrolled type 2 diabetes mellitus with hyperglycemia, without long-term current use of insulin (HCC) Assessment & Plan: Managed with diet/exercise.  Adheres to reduced carb, high protein diet and engages in regular exercise. Will check A1c today.  Orders: -     Microalbumin / creatinine urine ratio -     Comprehensive metabolic panel -     Hemoglobin A1c -     Lipid panel  Paroxysmal atrial fibrillation (HCC) Assessment & Plan: Noted.  No acute concerns. Follows with cardiology; defer to specialist management.  Continue to monitor.   Orders: -     TSH Rfx on Abnormal to Free T4  Hyperlipidemia associated with type 2 diabetes mellitus (HCC) Assessment & Plan: Adverse effects from statins. She has tried multiple medications with reported adverse effects. Cardiologist recommended Repatha, which she declined. Reported using Cardio Miracle supplement,  which she believes caused an increase in blood sugar levels and weight gain.  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 No current symptoms of neck pain, weakness, numbness, or tingling. Some fecal incontinence but it varies in relation to having her pessary cleaned. Advised against high-velocity neck movements and chiropractic manipulations. Discussed referral to orthopedics for further evaluation and potential MRI to assess stability of the fracture.   - Refer to orthopedics for evaluation   - Consider MRI to assess stability of the fracture    Orders: -     Ambulatory referral to Orthopedic Surgery  Osteopenia of left forearm Assessment & Plan: Forearm Radius 33% is 0.692 g/cm2 with a T-score of -2.1. (improved from prior -2.4 in 2015). Bone density scan last performed in 2020. Given the time elapsed and presence of an unhealed fracture, a repeat DEXA scan is warranted to assess current bone density.   - Order DEXA scan    Orders: -     DG Bone Density; Future  Lymphadenopathy Assessment & Plan: Noticeable enlargement in the neck area, suspected to be enlarged lymph nodes rather than thyroid. Mildly elevated thyroid level in the past (12/2022). Discussed ordering an ultrasound of the neck and checking thyroid levels.   - Order ultrasound of the neck   - Check thyroid levels    Orders: -     US SOFT TISSUE HEAD & NECK (NON-THYROID); Future  Pelvic floor dysfunction Assessment & Plan: Experiences stool leakage, which she associates  with the use of a pessary. Referred to a pelvic floor physical therapist but has not yet attended. Discussed the potential benefit of pelvic floor therapy and earlier pessary cleaning and check-up at 4-6 weeks.   -  Recommend pelvic floor physical therapy   - Suggest earlier pessary cleaning and check-up at 4-6 weeks      Return in about 1 year (around 09/25/2024) for CPE.     I discussed the assessment and treatment plan with the patient  The patient was provided an opportunity to ask questions and all were answered. The patient agreed with the plan and demonstrated an understanding of the instructions.   The patient was advised to call back or seek an in-person evaluation if the symptoms worsen or if the condition fails to improve as anticipated.    Sherlyn Hay, DO  [facility] [phone] (phone) [phone] (fax)  [facility]

## 2023-09-26 NOTE — Assessment & Plan Note (Signed)
Experiences stool leakage, which she associates with the use of a pessary. Referred to a pelvic floor physical therapist but has not yet attended. Discussed the potential benefit of pelvic floor therapy and earlier pessary cleaning and check-up at 4-6 weeks.   - Recommend pelvic floor physical therapy   - Suggest earlier pessary cleaning and check-up at 4-6 weeks

## 2023-09-26 NOTE — Assessment & Plan Note (Addendum)
Managed with diet/exercise.  Adheres to reduced carb, high protein diet and engages in regular exercise. Will check A1c today.

## 2023-09-26 NOTE — Assessment & Plan Note (Addendum)
Forearm Radius 33% is 0.692 g/cm2 with a T-score of -2.1. (improved from prior -2.4 in 2015). Bone density scan last performed in 2020. Given the time elapsed and presence of an unhealed fracture, a repeat DEXA scan is warranted to assess current bone density.   - Order DEXA scan

## 2023-09-26 NOTE — Assessment & Plan Note (Signed)
Noticeable enlargement in the neck area, suspected to be enlarged lymph nodes rather than thyroid. Mildly elevated thyroid level in the past (12/2022). Discussed ordering an ultrasound of the neck and checking thyroid levels.   - Order ultrasound of the neck   - Check thyroid levels

## 2023-09-26 NOTE — Assessment & Plan Note (Signed)
Unhealed traumatic fracture of the C1 vertebra identified on a CT scan several months ago. No current symptoms of neck pain, weakness, numbness, or tingling. Some fecal incontinence but it varies in relation to having her pessary cleaned. Advised against high-velocity neck movements and chiropractic manipulations. Discussed referral to orthopedics for further evaluation and potential MRI to assess stability of the fracture.   - Refer to orthopedics for evaluation   - Consider MRI to assess stability of the fracture

## 2023-09-26 NOTE — Assessment & Plan Note (Signed)
Noted.  No acute concerns. Follows with cardiology; defer to specialist management.  Continue to monitor.

## 2023-09-27 LAB — COMPREHENSIVE METABOLIC PANEL
ALT: 15 [IU]/L (ref 0–32)
AST: 16 [IU]/L (ref 0–40)
Albumin: 4.5 g/dL (ref 3.8–4.8)
Alkaline Phosphatase: 113 [IU]/L (ref 44–121)
BUN/Creatinine Ratio: 27 (ref 12–28)
BUN: 18 mg/dL (ref 8–27)
Bilirubin Total: 0.4 mg/dL (ref 0.0–1.2)
CO2: 23 mmol/L (ref 20–29)
Calcium: 9.3 mg/dL (ref 8.7–10.3)
Chloride: 102 mmol/L (ref 96–106)
Creatinine, Ser: 0.67 mg/dL (ref 0.57–1.00)
Globulin, Total: 2.1 g/dL (ref 1.5–4.5)
Glucose: 192 mg/dL — ABNORMAL HIGH (ref 70–99)
Potassium: 4.3 mmol/L (ref 3.5–5.2)
Sodium: 142 mmol/L (ref 134–144)
Total Protein: 6.6 g/dL (ref 6.0–8.5)
eGFR: 89 mL/min/{1.73_m2} (ref >59)

## 2023-09-27 LAB — LIPID PANEL
Chol/HDL Ratio: 4.5 {ratio} — ABNORMAL HIGH (ref 0.0–4.4)
Cholesterol, Total: 254 mg/dL — ABNORMAL HIGH (ref 100–199)
HDL: 57 mg/dL (ref >39)
LDL Chol Calc (NIH): 172 mg/dL — ABNORMAL HIGH (ref 0–99)
Triglycerides: 137 mg/dL (ref 0–149)
VLDL Cholesterol Cal: 25 mg/dL (ref 5–40)

## 2023-09-27 LAB — VITAMIN B12: Vitamin B-12: 656 pg/mL (ref 232–1245)

## 2023-09-27 LAB — HEMOGLOBIN A1C
Est. average glucose Bld gHb Est-mCnc: 177 mg/dL
Hgb A1c MFr Bld: 7.8 % — ABNORMAL HIGH (ref 4.8–5.6)

## 2023-09-27 LAB — MICROALBUMIN / CREATININE URINE RATIO
Creatinine, Urine: 145.3 mg/dL
Microalb/Creat Ratio: 23 mg/g{creat} (ref 0–29)
Microalbumin, Urine: 33.6 ug/mL

## 2023-09-27 LAB — VITAMIN D 25 HYDROXY (VIT D DEFICIENCY, FRACTURES): Vit D, 25-Hydroxy: 30.9 ng/mL (ref 30.0–100.0)

## 2023-09-27 LAB — TSH RFX ON ABNORMAL TO FREE T4: TSH: 4.07 u[IU]/mL (ref 0.450–4.500)

## 2023-09-28 ENCOUNTER — Ambulatory Visit
Admission: RE | Admit: 2023-09-28 | Discharge: 2023-09-28 | Disposition: A | Discharge status: Home / Self Care | Payer: Medicare Other | Source: Ambulatory Visit | Attending: Family Medicine | Admitting: Family Medicine

## 2023-09-28 DIAGNOSIS — R0989 Other specified symptoms and signs involving the circulatory and respiratory systems: Secondary | ICD-10-CM | POA: Diagnosis not present

## 2023-09-28 DIAGNOSIS — R599 Enlarged lymph nodes, unspecified: Secondary | ICD-10-CM | POA: Diagnosis not present

## 2023-09-28 DIAGNOSIS — R591 Generalized enlarged lymph nodes: Secondary | ICD-10-CM | POA: Diagnosis not present

## 2023-09-28 DIAGNOSIS — E042 Nontoxic multinodular goiter: Secondary | ICD-10-CM | POA: Diagnosis not present

## 2023-09-30 DIAGNOSIS — M542 Cervicalgia: Secondary | ICD-10-CM | POA: Diagnosis not present

## 2023-10-10 ENCOUNTER — Encounter: Payer: Self-pay | Admitting: Family Medicine

## 2023-10-12 ENCOUNTER — Encounter: Payer: Self-pay | Admitting: Family Medicine

## 2023-11-09 ENCOUNTER — Ambulatory Visit: Payer: Medicare HMO | Admitting: Emergency Medicine

## 2023-11-09 VITALS — Ht 65.5 in | Wt 155.0 lb

## 2023-11-09 DIAGNOSIS — Z1231 Encounter for screening mammogram for malignant neoplasm of breast: Secondary | ICD-10-CM

## 2023-11-09 DIAGNOSIS — Z0000 Encounter for general adult medical examination without abnormal findings: Secondary | ICD-10-CM

## 2023-11-09 NOTE — Patient Instructions (Addendum)
 Ms. Decock , Thank you for taking time to come for your Medicare Wellness Visit. I appreciate your ongoing commitment to your health goals. Please review the following plan we discussed and let me know if I can assist you in the future.   Referrals/Orders/Follow-Ups/Clinician Recommendations: I have placed an order for a mammogram (due 12/23/23). Call [facility] @ [phone] to schedule at your convenience.  This is a list of the screening recommended for you and due dates:  Health Maintenance  Topic Date Due   COVID-19 Vaccine (1) Never done   Flu Shot  11/28/2023*   Zoster (Shingles) Vaccine (1 of 2) 12/25/2023*   DTaP/Tdap/Td vaccine (3 - Td or Tdap) 09/25/2024*   Pneumonia Vaccine (2 of 2 - PPSV23 or PCV20) 09/25/2024*   Mammogram  12/22/2023   Hemoglobin A1C  03/25/2024   DEXA scan (bone density measurement)  05/02/2024   Eye exam for diabetics  05/11/2024   Yearly kidney function blood test for diabetes  09/25/2024   Yearly kidney health urinalysis for diabetes  09/25/2024   Complete foot exam   09/25/2024   Medicare Annual Wellness Visit  11/08/2024   Hepatitis C Screening  Completed   HPV Vaccine  Aged Out   Colon Cancer Screening  Discontinued  *Topic was postponed. The date shown is not the original due date.    Advanced directives: (ACP Link)Information on Advanced Care Planning can be found at [facility] Advance Health Care Directives Advance Health Care Directives. [email or web address]  You may also get these forms at your doctor's office.  Once you have completed the forms, please bring a copy of your health care power of attorney and living will to the office to be added to your chart at your convenience.   Next Medicare Annual Wellness Visit scheduled for next year: Yes, 11/14/24 @ 3:50pm (video visit)

## 2023-11-09 NOTE — Progress Notes (Signed)
 Subjective:   Christine Richards is a 78 y.o. who presents for a Medicare Wellness preventive visit.  Visit Complete: Virtual I connected with  Christine Quakenbush V. on 11/09/23 by a video and audio enabled telemedicine application and verified that I am speaking with the correct person using two identifiers.  Patient Location: Home  Provider Location: Home Office  I discussed the limitations of evaluation and management by telemedicine. The patient expressed understanding and agreed to proceed.  Vital Signs: Because this visit was a virtual/telehealth visit, some criteria may be missing or patient reported. Any vitals not documented were not able to be obtained and vitals that have been documented are patient reported.    AWV Questionnaire: No: Patient Medicare AWV questionnaire was not completed prior to this visit.  Cardiac Risk Factors include: advanced age (>18men, >18 women);diabetes mellitus;hypertension;dyslipidemia;Other (see comment), Risk factor comments: OSA (cpap)     Objective:    Today's Vitals   11/09/23 1545  Weight: 155 lb (70.3 kg)  Height: 5' 5.5" (1.664 m)   Body mass index is 25.4 kg/m.     11/09/2023    4:02 PM 01/17/2023    8:48 AM 09/13/2022    4:01 PM 09/10/2021    1:49 PM 12/19/2019    8:45 AM 09/26/2019    9:22 AM 12/18/2018    9:30 AM  Advanced Directives  Does Patient Have a Medical Advance Directive? No No No No No No Yes  Type of Building surveyor of Healthcare Power of Attorney in Chart?       No - copy requested  Would patient like information on creating a medical advance directive? Yes (MAU/Ambulatory/Procedural Areas - Information given) Yes (MAU/Ambulatory/Procedural Areas - Information given) No - Patient declined No - Patient declined No - Patient declined No - Patient declined     Current Medications (verified) Outpatient Encounter Medications as of 11/09/2023  Medication Sig   ALPHA LIPOIC ACID PO  Take by mouth.   Berberine Chloride 500 MG CAPS    BIOTIN PO Take by mouth.   blood glucose meter kit and supplies Dispense based on patient and insurance preference. To check sugar once daily.   Blood Glucose Monitoring Suppl DEVI 1 each by Does not apply route in the morning, at noon, and at bedtime. May substitute to any manufacturer covered by patient's insurance.   Cholecalciferol (VITAMIN D) 2000 UNITS CAPS Take 4,000 Units by mouth daily.   clopidogrel (PLAVIX) 75 MG tablet TAKE ONE TABLET (75 MG) BY MOUTH EVERY DAY   estradiol (ESTRACE) 0.1 MG/GM vaginal cream Place 1 Applicatorful vaginally at bedtime.   Omega-3 Fatty Acids (FISH OIL) 1000 MG CAPS Take 1,000-2,000 mg by mouth at bedtime.    OXYQUINOLONE SULFATE VAGINAL (TRIMO-SAN) 0.025 % GEL Place 1 Application vaginally at bedtime as needed.   Red Yeast Rice Extract (RED YEAST RICE PO) Take by mouth.   TRUEplus Lancets 33G MISC    acetaminophen (TYLENOL) 160 MG/5ML elixir Take by mouth every 4 (four) hours as needed for fever. (Patient not taking: Reported on 11/09/2023)   Cyanocobalamin (VITAMIN B-12) 1000 MCG SUBL Place 1 tablet (1,000 mcg total) under the tongue daily. (Patient not taking: Reported on 11/09/2023)   No facility-administered encounter medications on file as of 11/09/2023.    Allergies (verified) Antihistamines, chlorpheniramine-type; Macrobid [nitrofurantoin macrocrystal]; and Statins   History: Past Medical History:  Diagnosis Date   Arthritis  rheumatoid   Carotid arterial disease (HCC)    Diabetes mellitus without complication (HCC)    Diverticulosis    Environmental and seasonal allergies    Hyperlipidemia    Osteopenia    Sciatica 2024   Sinus congestion    Sleep apnea    uses CPAP   TIA (transient ischemic attack)    Past Surgical History:  Procedure Laterality Date   BREAST BIOPSY Right 2010   benign   BREAST CYST ASPIRATION Right    BREAST SURGERY Right 2010   biopsy   CAROTID PTA/STENT  INTERVENTION Right 01/17/2023   Procedure: CAROTID PTA/STENT INTERVENTION;  Surgeon: Annice Needy, MD;  Location: [facility] INVASIVE CV [facility];  Service: Cardiovascular;  Laterality: Right;   CATARACT EXTRACTION W/PHACO Right 11/11/2016   Procedure: CATARACT EXTRACTION PHACO AND INTRAOCULAR LENS PLACEMENT (IOC);  Surgeon: Nevada Crane, MD;  Location: [facility] ORS;  Service: Ophthalmology;  Laterality: Right;  [place] 1:04.9 AP% 14.6 CDE 9.64 Fluid pack lot # [id number]   CATARACT EXTRACTION W/PHACO Left 09/26/2019   Procedure: CATARACT EXTRACTION PHACO AND INTRAOCULAR LENS PLACEMENT (IOC) LEFT DIABETIC SYMFONY LENS 12.82 01:22.2 15.6%;  Surgeon: Lockie Mola, MD;  Location: [facility];  Service: Ophthalmology;  Laterality: Left;   ESOPHAGOGASTRODUODENOSCOPY (EGD) WITH PROPOFOL N/A 12/29/2015   Procedure: ESOPHAGOGASTRODUODENOSCOPY (EGD) WITH PROPOFOL;  Surgeon: Midge Minium, MD;  Location: [facility];  Service: Endoscopy;  Laterality: N/A;  GASTRI BIOPSY   TUBAL LIGATION     Family History  Problem Relation Age of Onset   Congestive Heart Failure Mother    Heart attack Mother    Dementia Father    Heart attack Father    Prostate cancer Father    Atrial fibrillation Sister    Hyperlipidemia Sister    Heart disease Brother    Breast cancer Neg Hx    Social History   Socioeconomic History   Marital status: Widowed    Spouse name: Not on file   Number of children: 2   Years of education: College   Highest education level: Bachelor's degree (e.g., BA, AB, BS)  Occupational History   Occupation: [employer]    Comment: Full Time   Occupation: retired    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, environmental sometimes  Tobacco Use   Smoking status: Never   Smokeless tobacco: Never  Vaping Use   Vaping status: Never Used  Substance and Sexual Activity   Alcohol use: No   Drug use: No   Sexual activity: Not Currently  Other Topics Concern   Not on file  Social  History Narrative   2 biological children and 1 step child   Social Drivers of Corporate investment banker Strain: Low Risk  (11/09/2023)   Overall Financial Resource Strain (CARDIA)    Difficulty of Paying Living Expenses: Not hard at all  Food Insecurity: No Food Insecurity (11/09/2023)   Hunger Vital Sign    Worried About Running Out of Food in the Last Year: Never true    Ran Out of Food in the Last Year: Never true  Transportation Needs: No Transportation Needs (11/09/2023)   PRAPARE - Administrator, Civil Service (Medical): No    Lack of Transportation (Non-Medical): No  Physical Activity: Sufficiently Active (11/09/2023)   Exercise Vital Sign    Days of Exercise per Week: 4 days    Minutes of Exercise per Session: 60 min  Stress: No Stress Concern Present (11/09/2023)   [place]  [employer] of [employer]ational [employer] - Occupational Stress Questionnaire    Feeling of Stress : Not at all  Social Connections: Moderately Isolated (11/09/2023)   Social Connection and Isolation Panel [NHANES]    Frequency of Communication with Friends and Family: More than three times a week    Frequency of Social Gatherings with Friends and Family: More than three times a week    Attends Religious Services: More than 4 times per year    Active Member of [employer] or Organizations: No    Attends Banker Meetings: Never    Marital Status: Widowed    Tobacco Counseling Counseling given: Not Answered    Clinical Intake:  Pre-visit preparation completed: Yes  Pain : No/denies pain     BMI - recorded: 25.4 Nutritional Status: BMI 25 -29 Overweight Nutritional Risks: None Diabetes: Yes CBG done?: No (FBS 133 per patient) Did pt. bring in CBG monitor from home?: No  How often do you need to have someone help you when you read instructions, pamphlets, or other written materials from your doctor or pharmacy?: 1 - Never  Interpreter Needed?: No  Comments: Tora Kindred,  CMA   Activities of Daily Living     11/09/2023    3:52 PM 01/17/2023   12:45 PM  In your present state of health, do you have any difficulty performing the following activities:  Hearing? 1   Comment wears hearing aids   Vision? 0   Difficulty concentrating or making decisions? 0   Walking or climbing stairs? 0   Dressing or bathing? 0   Doing errands, shopping? 0 0  Preparing Food and eating ? N   Using the Toilet? N   In the past six months, have you accidently leaked urine? Y   Comment wears a pad   Do you have problems with loss of bowel control? Y   Comment occassional will leak a little bit   Managing your Medications? N   Managing your Finances? N   Housekeeping or managing your Housekeeping? N     Patient Care Team: Sherlyn Hay, DO as PCP - General (Family Medicine) Lockie Mola, MD as Referring Physician (Ophthalmology) Wyn Quaker Marlow Baars, MD as Referring Physician (Vascular Surgery)  Indicate any recent Medical Services you may have received from other than [facility] providers in the past year (date may be approximate).     Assessment:   This is a routine wellness examination for [place].  Hearing/Vision screen Hearing Screening - Comments:: Wear hearing aids Vision Screening - Comments:: Gets eye exams, Dr Marylynn Pearson [place]   Goals Addressed             This Visit's Progress    Patient Stated       Maintain current health and activity level       Depression Screen     11/09/2023    4:00 PM 09/26/2023    8:27 AM 11/29/2022    2:38 PM 09/13/2022    3:56 PM 06/16/2022    3:01 PM 03/16/2022   10:43 AM 02/12/2022   10:36 AM  PHQ 2/9 Scores  PHQ - 2 Score 0 0 0 0 0 0 6  PHQ- 9 Score 0 0 0  0 0 12    Fall Risk     11/09/2023    4:05 PM 09/26/2023    8:27 AM 11/29/2022    2:38 PM 09/13/2022    3:52 PM 06/16/2022    3:01 PM  Fall Risk  Falls in the past year? 0 0 0 1 0  Comment    tripped at gym jammed shoulder :did not seek medical  attention   Number falls in past yr: 0 0 0 1 0  Injury with Fall? 0 0 0 0 0  Risk for fall due to : No Fall Risks  No Fall Risks No Fall Risks No Fall Risks  Follow up Falls prevention discussed;Falls evaluation completed  Falls evaluation completed Falls prevention discussed;Education provided Falls evaluation completed    MEDICARE RISK AT HOME:  Medicare Risk at Home Any stairs in or around the home?: Yes If so, are there any without handrails?: No Home free of loose throw rugs in walkways, pet beds, electrical cords, etc?: Yes Adequate lighting in your home to reduce risk of falls?: Yes Life alert?: Yes Use of a cane, walker or w/c?: No Grab bars in the bathroom?: No Shower chair or bench in shower?: No Elevated toilet seat or a handicapped toilet?: Yes  TIMED UP AND GO:  Was the test performed?  No  Cognitive Function: 6CIT completed        11/09/2023    4:07 PM 09/13/2022    4:06 PM 12/19/2019    8:51 AM 12/09/2016    9:51 AM  6CIT Screen  What Year? 0 points 0 points 0 points 0 points  What month? 0 points 0 points 0 points 0 points  What time? 0 points 0 points 0 points 0 points  Count back from 20 0 points 0 points 0 points 0 points  Months in reverse 0 points 0 points 0 points 0 points  Repeat phrase 0 points 0 points 0 points 0 points  Total Score 0 points 0 points 0 points 0 points    Immunizations Immunization History  Administered Date(s) Administered   Pneumococcal Conjugate-13 08/06/2015   Td 12/22/2007   Tdap 12/22/2007    Screening Tests Health Maintenance  Topic Date Due   COVID-19 Vaccine (1) Never done   INFLUENZA VACCINE  11/28/2023 (Originally 03/31/2023)   Zoster Vaccines- Shingrix (1 of 2) 12/25/2023 (Originally 09/05/1964)   DTaP/Tdap/Td (3 - Td or Tdap) 09/25/2024 (Originally 12/21/2017)   Pneumonia Vaccine 34+ Years old (2 of 2 - PPSV23 or PCV20) 09/25/2024 (Originally 10/01/2015)   MAMMOGRAM  12/22/2023   HEMOGLOBIN A1C  03/25/2024   DEXA  SCAN  05/02/2024   OPHTHALMOLOGY EXAM  05/11/2024   Diabetic kidney evaluation - eGFR measurement  09/25/2024   Diabetic kidney evaluation - Urine ACR  09/25/2024   FOOT EXAM  09/25/2024   Medicare Annual Wellness (AWV)  11/08/2024   Hepatitis C Screening  Completed   HPV VACCINES  Aged Out   Colonoscopy  Discontinued    Health Maintenance  Health Maintenance Due  Topic Date Due   COVID-19 Vaccine (1) Never done   Health Maintenance Items Addressed: Mammogram ordered, See Nurse Notes  Additional Screening:  Vision Screening: Recommended annual ophthalmology exams for early detection of glaucoma and other disorders of the eye.  Dental Screening: Recommended annual dental exams for proper oral hygiene  Community Resource Referral / Chronic Care Management: CRR required this visit?  No   CCM required this visit?  No     Plan:     I have personally reviewed and noted the following in the patient's chart:   Medical and social history Use of alcohol, tobacco or illicit drugs  Current medications and supplements including opioid prescriptions. Patient is not currently taking opioid  prescriptions. Functional ability and status Nutritional status Physical activity Advanced directives List of other physicians Hospitalizations, surgeries, and ER visits in previous 12 months Vitals Screenings to include cognitive, depression, and falls Referrals and appointments  In addition, I have reviewed and discussed with patient certain preventive protocols, quality metrics, and best practice recommendations. A written personalized care plan for preventive services as well as general preventive health recommendations were provided to patient.     Tora Kindred, CMA   11/09/2023   After Visit Summary: (MyChart) Due to this being a telephonic visit, the after visit summary with patients personalized plan was offered to patient via MyChart   Notes:  Placed order for a MMG (due  12/23/23) Has DEXA scan scheduled for 11/16/23 Declined all vaccines

## 2023-11-11 ENCOUNTER — Other Ambulatory Visit (INDEPENDENT_AMBULATORY_CARE_PROVIDER_SITE_OTHER): Payer: Self-pay | Admitting: Nurse Practitioner

## 2023-11-11 DIAGNOSIS — I6523 Occlusion and stenosis of bilateral carotid arteries: Secondary | ICD-10-CM

## 2023-11-14 ENCOUNTER — Ambulatory Visit (INDEPENDENT_AMBULATORY_CARE_PROVIDER_SITE_OTHER): Payer: Medicare HMO | Admitting: Nurse Practitioner

## 2023-11-14 ENCOUNTER — Ambulatory Visit (INDEPENDENT_AMBULATORY_CARE_PROVIDER_SITE_OTHER): Payer: Medicare HMO

## 2023-11-14 ENCOUNTER — Encounter (INDEPENDENT_AMBULATORY_CARE_PROVIDER_SITE_OTHER): Payer: Self-pay | Admitting: Nurse Practitioner

## 2023-11-14 VITALS — BP 132/68 | HR 64 | Resp 18 | Ht 65.5 in | Wt 160.4 lb

## 2023-11-14 DIAGNOSIS — E782 Mixed hyperlipidemia: Secondary | ICD-10-CM | POA: Diagnosis not present

## 2023-11-14 DIAGNOSIS — I6523 Occlusion and stenosis of bilateral carotid arteries: Secondary | ICD-10-CM | POA: Diagnosis not present

## 2023-11-14 DIAGNOSIS — E1165 Type 2 diabetes mellitus with hyperglycemia: Secondary | ICD-10-CM | POA: Diagnosis not present

## 2023-11-14 NOTE — Progress Notes (Unsigned)
 Subjective:    Patient ID: Christine Speak., female    DOB: 1946-04-24, 78 y.o.   MRN: [id number] Chief Complaint  Patient presents with   Follow-up    6 month carotid    The patient is seen for follow up evaluation of carotid stenosis status post right carotid stent on 01/17/2023.  The patient had an episode of atrial fibrillation postsurgery.  She has had 1 recurrence since then and it was paroxysmal in nature.  The patient denies neck or incisional pain.  The patient denies interval amaurosis fugax. There is no recent history of TIA symptoms or focal motor deficits. There is no prior documented CVA.  The patient denies headache.  The patient is taking enteric-coated aspirin 81 mg daily.  No recent shortening of the patient's walking distance or new symptoms consistent with claudication.  No history of rest pain symptoms. No new ulcers or wounds of the lower extremities have occurred.  There is no history of DVT, PE or superficial thrombophlebitis. No recent episodes of angina or shortness of breath documented.   Today noninvasive studies show widely patent right ICA.  There is a 1 to 39% stenosis of the left ICA.  The vertebral arteries have antegrade flow bilaterally.  Normal flow in the dynamics in the bilateral subclavian arteries.     Review of Systems  All other systems reviewed and are negative.      Objective:   Physical Exam Vitals reviewed.  HENT:     Head: Normocephalic.  Neck:     Vascular: Carotid bruit present.  Cardiovascular:     Rate and Rhythm: Normal rate.     Pulses: Normal pulses.  Pulmonary:     Effort: Pulmonary effort is normal.  Skin:    General: Skin is warm and dry.  Neurological:     Mental Status: She is alert and oriented to person, place, and time.  Psychiatric:        Mood and Affect: Mood normal.        Behavior: Behavior normal.        Thought Content: Thought content normal.        Judgment: Judgment normal.     BP 132/68    Pulse 64   Resp 18   Ht 5' 5.5" (1.664 m)   Wt 160 lb 6.4 oz (72.8 kg)   BMI 26.29 kg/m   Past Medical History:  Diagnosis Date   Arthritis    rheumatoid   Carotid arterial disease (HCC)    Diabetes mellitus without complication (HCC)    Diverticulosis    Environmental and seasonal allergies    Hyperlipidemia    Osteopenia    Sciatica 2024   Sinus congestion    Sleep apnea    uses CPAP   TIA (transient ischemic attack)     Social History   Socioeconomic History   Marital status: Widowed    Spouse name: Not on file   Number of children: 2   Years of education: College   Highest education level: Bachelor's degree (e.g., BA, AB, BS)  Occupational History   Occupation: [employer]    Comment: Full Time   Occupation: retired    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, environmental sometimes  Tobacco Use   Smoking status: Never   Smokeless tobacco: Never  Vaping Use   Vaping status: Never Used  Substance and Sexual Activity   Alcohol use: No   Drug use: No   Sexual activity:  Not Currently  Other Topics Concern   Not on file  Social History Narrative   2 biological children and 1 step child   Social Drivers of Corporate investment banker Strain: Low Risk  (11/09/2023)   Overall Financial Resource Strain (CARDIA)    Difficulty of Paying Living Expenses: Not hard at all  Food Insecurity: No Food Insecurity (11/09/2023)   Hunger Vital Sign    Worried About Running Out of Food in the Last Year: Never true    Ran Out of Food in the Last Year: Never true  Transportation Needs: No Transportation Needs (11/09/2023)   PRAPARE - Administrator, Civil Service (Medical): No    Lack of Transportation (Non-Medical): No  Physical Activity: Sufficiently Active (11/09/2023)   Exercise Vital Sign    Days of Exercise per Week: 4 days    Minutes of Exercise per Session: 60 min  Stress: No Stress Concern Present (11/09/2023)   [employer] of Occupational  Health - Occupational Stress Questionnaire    Feeling of Stress : Not at all  Social Connections: Moderately Isolated (11/09/2023)   Social Connection and Isolation Panel [NHANES]    Frequency of Communication with Friends and Family: More than three times a week    Frequency of Social Gatherings with Friends and Family: More than three times a week    Attends Religious Services: More than 4 times per year    Active Member of [employer] or Organizations: No    Attends Banker Meetings: Never    Marital Status: Widowed  Intimate Partner Violence: Not At Risk (11/09/2023)   Humiliation, Afraid, Rape, and Kick questionnaire    Fear of Current or Ex-Partner: No    Emotionally Abused: No    Physically Abused: No    Sexually Abused: No    Past Surgical History:  Procedure Laterality Date   BREAST BIOPSY Right 2010   benign   BREAST CYST ASPIRATION Right    BREAST SURGERY Right 2010   biopsy   CAROTID PTA/STENT INTERVENTION Right 01/17/2023   Procedure: CAROTID PTA/STENT INTERVENTION;  Surgeon: Annice Needy, MD;  Location: [facility] INVASIVE CV [facility];  Service: Cardiovascular;  Laterality: Right;   CATARACT EXTRACTION W/PHACO Right 11/11/2016   Procedure: CATARACT EXTRACTION PHACO AND INTRAOCULAR LENS PLACEMENT (IOC);  Surgeon: Nevada Crane, MD;  Location: [facility] ORS;  Service: Ophthalmology;  Laterality: Right;  [place] 1:04.9 AP% 14.6 CDE 9.64 Fluid pack lot # [id number]   CATARACT EXTRACTION W/PHACO Left 09/26/2019   Procedure: CATARACT EXTRACTION PHACO AND INTRAOCULAR LENS PLACEMENT (IOC) LEFT DIABETIC SYMFONY LENS 12.82 01:22.2 15.6%;  Surgeon: Lockie Mola, MD;  Location: [facility];  Service: Ophthalmology;  Laterality: Left;   ESOPHAGOGASTRODUODENOSCOPY (EGD) WITH PROPOFOL N/A 12/29/2015   Procedure: ESOPHAGOGASTRODUODENOSCOPY (EGD) WITH PROPOFOL;  Surgeon: Midge Minium, MD;  Location: [facility];  Service: Endoscopy;  Laterality: N/A;  GASTRI BIOPSY   TUBAL  LIGATION      Family History  Problem Relation Age of Onset   Congestive Heart Failure Mother    Heart attack Mother    Dementia Father    Heart attack Father    Prostate cancer Father    Atrial fibrillation Sister    Hyperlipidemia Sister    Heart disease Brother    Breast cancer Neg Hx     Allergies  Allergen Reactions   Antihistamines, Chlorpheniramine-Type     Facial swelling    Macrobid [Nitrofurantoin Macrocrystal]     unknown  Statins     Muscle Pain, Weakness        Latest Ref Rng & Units 01/20/2023    4:17 AM 01/18/2023    3:50 AM 11/29/2022    3:02 PM  CBC  WBC 4.0 - 10.5 K/uL 5.3  10.5  6.3   Hemoglobin 12.0 - 15.0 g/dL 16.1  09.6  04.5   Hematocrit 36.0 - 46.0 % 33.5  43.7  40.6   Platelets 150 - 400 K/uL 183  241  241       CMP     Component Value Date/Time   NA 142 09/26/2023 0915   NA 138 03/17/2013 1443   K 4.3 09/26/2023 0915   K 3.6 03/17/2013 1443   CL 102 09/26/2023 0915   CL 105 03/17/2013 1443   CO2 23 09/26/2023 0915   CO2 30 03/17/2013 1443   GLUCOSE 192 (H) 09/26/2023 0915   GLUCOSE 156 (H) 01/20/2023 0417   GLUCOSE 192 (H) 03/17/2013 1443   BUN 18 09/26/2023 0915   BUN 16 03/17/2013 1443   CREATININE 0.67 09/26/2023 0915   CREATININE 0.72 03/17/2013 1443   CALCIUM 9.3 09/26/2023 0915   CALCIUM 9.1 03/17/2013 1443   PROT 6.6 09/26/2023 0915   PROT 7.5 03/17/2013 1443   ALBUMIN 4.5 09/26/2023 0915   ALBUMIN 3.8 03/17/2013 1443   AST 16 09/26/2023 0915   AST 20 03/17/2013 1443   ALT 15 09/26/2023 0915   ALT 23 03/17/2013 1443   ALKPHOS 113 09/26/2023 0915   ALKPHOS 103 03/17/2013 1443   BILITOT 0.4 09/26/2023 0915   BILITOT 0.4 03/17/2013 1443   EGFR 89 09/26/2023 0915   GFRNONAA >60 01/20/2023 0417   GFRNONAA >60 03/17/2013 1443     No results found.     Assessment & Plan:   1. Bilateral carotid artery stenosis Recommend:  The patient is s/p successful right carotid stent  Duplex ultrasound  shows widely  patent right ICA stent.  With minimal left ICA stenosis.  Continue antiplatelet therapy as prescribed Continue management of CAD, HTN and Hyperlipidemia Healthy heart diet,  encouraged exercise at least 4 times per week  Follow up in 12 months with duplex ultrasound and physical exam based on the patient's carotid intervention.   2. Mixed hyperlipidemia Continue statin as ordered and reviewed, no changes at this time  3. Uncontrolled type 2 diabetes mellitus with hyperglycemia, without long-term current use of insulin (HCC) Maintained via diet and exercise   Current Outpatient Medications on File Prior to Visit  Medication Sig Dispense Refill   ALPHA LIPOIC ACID PO Take by mouth.     Berberine Chloride 500 MG CAPS      BIOTIN PO Take by mouth.     blood glucose meter kit and supplies Dispense based on patient and insurance preference. To check sugar once daily. 1 each 0   Blood Glucose Monitoring Suppl DEVI 1 each by Does not apply route in the morning, at noon, and at bedtime. May substitute to any manufacturer covered by patient's insurance. 1 each 0   Cholecalciferol (VITAMIN D) 2000 UNITS CAPS Take 4,000 Units by mouth daily.     clopidogrel (PLAVIX) 75 MG tablet TAKE ONE TABLET (75 MG) BY MOUTH EVERY DAY 30 tablet 6   estradiol (ESTRACE) 0.1 MG/GM vaginal cream Place 1 Applicatorful vaginally at bedtime. 42.5 g 4   Omega-3 Fatty Acids (FISH OIL) 1000 MG CAPS Take 1,000-2,000 mg by mouth at bedtime.  OXYQUINOLONE SULFATE VAGINAL (TRIMO-SAN) 0.025 % GEL Place 1 Application vaginally at bedtime as needed. 113.4 g 0   Red Yeast Rice Extract (RED YEAST RICE PO) Take by mouth.     TRUEplus Lancets 33G MISC      acetaminophen (TYLENOL) 160 MG/5ML elixir Take by mouth every 4 (four) hours as needed for fever. (Patient not taking: Reported on 11/14/2023)     Cyanocobalamin (VITAMIN B-12) 1000 MCG SUBL Place 1 tablet (1,000 mcg total) under the tongue daily. (Patient not taking: Reported  on 09/26/2023)  0   No current facility-administered medications on file prior to visit.    There are no Patient Instructions on file for this visit. No follow-ups on file.   Georgiana Spinner, NP

## 2023-11-16 ENCOUNTER — Ambulatory Visit
Admission: RE | Admit: 2023-11-16 | Discharge: 2023-11-16 | Disposition: A | Discharge status: Home / Self Care | Payer: Medicare Other | Source: Ambulatory Visit | Attending: Family Medicine | Admitting: Family Medicine

## 2023-11-16 DIAGNOSIS — M85832 Other specified disorders of bone density and structure, left forearm: Secondary | ICD-10-CM | POA: Diagnosis not present

## 2023-11-16 DIAGNOSIS — Z780 Asymptomatic menopausal state: Secondary | ICD-10-CM | POA: Diagnosis not present

## 2023-11-25 ENCOUNTER — Encounter: Payer: Self-pay | Admitting: Family Medicine

## 2023-12-15 ENCOUNTER — Encounter: Payer: Self-pay | Admitting: Obstetrics and Gynecology

## 2023-12-15 ENCOUNTER — Ambulatory Visit: Payer: Medicare Other | Admitting: Obstetrics and Gynecology

## 2023-12-15 VITALS — BP 128/76 | HR 71

## 2023-12-15 DIAGNOSIS — N8110 Cystocele, unspecified: Secondary | ICD-10-CM

## 2023-12-15 DIAGNOSIS — R151 Fecal smearing: Secondary | ICD-10-CM

## 2023-12-15 DIAGNOSIS — N812 Incomplete uterovaginal prolapse: Secondary | ICD-10-CM

## 2023-12-15 DIAGNOSIS — N813 Complete uterovaginal prolapse: Secondary | ICD-10-CM

## 2023-12-15 DIAGNOSIS — N816 Rectocele: Secondary | ICD-10-CM

## 2023-12-15 NOTE — Patient Instructions (Addendum)
 Consider doing fish oil once a day or fish oil with magnesium (250mg ) daily for your bowels.  Consider getting a stool to help make sure the rectal muscles are relaxed for a complete bowel movement.   Consider doing a TENS unit the same way we would PTNS. Go to the inside of your ankle bone and put the patches on the ankle at 3 fingerlengths above and 1 finger length back of the ankle bone. You want to stimulate this until you feel it in your toes or bottom of your foot and you can see if that is helpful.

## 2023-12-15 NOTE — Progress Notes (Signed)
  Urogynecology   Subjective:     Chief Complaint:  Chief Complaint  Patient presents with   Pessary Check    Mardy Hoppe is a 78 y.o. female is here for pessary cleaning.   History of Present Illness: Jorgia Manthei is a 78 y.o. female with stage IV pelvic organ prolapse who presents for a pessary check. She is using a size 2 3/4in short stem gellhorn pessary. The pessary has been working well and she has no complaints. She is using vaginal estrogen. She denies vaginal bleeding.  Patient is still having fecal smearing and has not started pelvic floor PT due to her frequent doctors visit.    Past Medical History: Patient  has a past medical history of Arthritis, Carotid arterial disease (HCC), Diabetes mellitus without complication (HCC), Diverticulosis, Environmental and seasonal allergies, Hyperlipidemia, Osteopenia, Sciatica (2024), Sinus congestion, Sleep apnea, and TIA (transient ischemic attack).   Past Surgical History: She  has a past surgical history that includes Tubal ligation; Breast surgery (Right, 2010); Esophagogastroduodenoscopy (egd) with propofol (N/A, 12/29/2015); Cataract extraction w/PHACO (Right, 11/11/2016); Breast cyst aspiration (Right); Cataract extraction w/PHACO (Left, 09/26/2019); Breast biopsy (Right, 2010); and CAROTID PTA/STENT INTERVENTION (Right, 01/17/2023).   Medications: She has a current medication list which includes the following prescription(s): acetaminophen, alpha-lipoic acid, berberine chloride, biotin, blood glucose meter kit and supplies, blood glucose monitoring suppl, vitamin d, clopidogrel, vitamin b-12, estradiol, fish oil, trimo-san, red yeast rice extract, and trueplus lancets 33g.   Allergies: Patient is allergic to antihistamines, chlorpheniramine-type; macrobid [nitrofurantoin macrocrystal]; and statins.   Social History: Patient  reports that she has never smoked. She has never used smokeless tobacco. She reports that she  does not drink alcohol and does not use drugs.      Objective:    Physical Exam: BP 128/76   Pulse 71  Gen: No apparent distress, A&O x 3. Detailed Urogynecologic Evaluation:  Pelvic Exam: Normal external female genitalia; Bartholin's and Skene's glands normal in appearance; urethral meatus normal in appearance, no urethral masses or discharge. The pessary was noted to be in place. It was removed and cleaned. Speculum exam revealed no lesions in the vagina but the labia appear red and irritated, aptima swab obtained. The pessary was replaced. It was comfortable to the patient and fit well.     Assessment/Plan:    Assessment: Ms. Shively is a 78 y.o. with stage IV pelvic organ prolapse here for a pessary check. She is doing well.  Plan: She will keep the pessary in place until next visit. She will continue to use estrogen. She will follow-up in 3 months for a pessary check or sooner as needed.   We discussed options of PTNS vs SNM for her fecal smearing so she has some options moving forward. Patient reports she will consider her options.   All questions were answered.

## 2024-01-05 ENCOUNTER — Telehealth: Payer: Self-pay | Admitting: Family Medicine

## 2024-01-05 DIAGNOSIS — E119 Type 2 diabetes mellitus without complications: Secondary | ICD-10-CM

## 2024-01-05 MED ORDER — BLOOD GLUCOSE METER KIT
PACK | 0 refills | Status: DC
Start: 2024-01-05 — End: 2024-01-05

## 2024-01-05 MED ORDER — BLOOD GLUCOSE METER KIT
PACK | 0 refills | Status: DC
Start: 2024-01-05 — End: 2024-01-06

## 2024-01-05 NOTE — Telephone Encounter (Signed)
 Copied from CRM [id number]. Topic: Clinical - Medication Refill >> Jan 05, 2024 11:49 AM Bearl Botts E wrote: Medication: blood glucose meter kit and supplies   Has the patient contacted their pharmacy? Yes (Agent: If no, request that the patient contact the pharmacy for the refill. If patient does not wish to contact the pharmacy document the reason why and proceed with request.) (Agent: If yes, when and what did the pharmacy advise?)  This is the patient's preferred pharmacy:    [id number] [place], [place] - [address] AT [facility] [address] [place] [address] Phone: [phone] Fax: [phone]   Is this the correct pharmacy for this prescription? Yes If no, delete pharmacy and type the correct one.   Has the prescription been filled recently? Yes  Is the patient out of the medication? Yes  Has the patient been seen for an appointment in the last year OR does the patient have an upcoming appointment? Yes  Can we respond through MyChart? Yes  Agent: Please be advised that Rx refills may take up to 3 business days. We ask that you follow-up with your pharmacy.

## 2024-01-06 ENCOUNTER — Other Ambulatory Visit: Payer: Self-pay

## 2024-01-06 ENCOUNTER — Other Ambulatory Visit: Payer: Self-pay | Admitting: Family Medicine

## 2024-01-06 DIAGNOSIS — E119 Type 2 diabetes mellitus without complications: Secondary | ICD-10-CM

## 2024-01-06 DIAGNOSIS — E1165 Type 2 diabetes mellitus with hyperglycemia: Secondary | ICD-10-CM

## 2024-01-06 MED ORDER — BLOOD GLUCOSE METER KIT: PACK | 0 refills | Status: AC

## 2024-01-06 MED ORDER — LANCET DEVICE MISC: 1.0000 | Freq: Every day | 0 refills | Status: AC

## 2024-01-06 MED ORDER — BLOOD GLUCOSE TEST VI STRP: 1.0000 | ORAL_STRIP | Freq: Every day | 3 refills | Status: DC

## 2024-01-06 MED ORDER — BLOOD GLUCOSE MONITORING SUPPL DEVI: 1.0000 | Freq: Every day | 0 refills | Status: DC

## 2024-01-06 MED ORDER — LANCETS MISC. MISC: 1.0000 | Freq: Every day | 3 refills | Status: AC

## 2024-01-06 MED ORDER — BLOOD GLUCOSE METER KIT: PACK | 0 refills | Status: DC

## 2024-01-11 DIAGNOSIS — K080 Exfoliation of teeth due to systemic causes: Secondary | ICD-10-CM | POA: Diagnosis not present

## 2024-01-17 ENCOUNTER — Encounter (INDEPENDENT_AMBULATORY_CARE_PROVIDER_SITE_OTHER): Payer: Self-pay

## 2024-01-20 ENCOUNTER — Ambulatory Visit
Admission: RE | Admit: 2024-01-20 | Discharge: 2024-01-20 | Disposition: A | Discharge status: Home / Self Care | Source: Ambulatory Visit | Attending: Family Medicine | Admitting: Family Medicine

## 2024-01-20 DIAGNOSIS — Z1231 Encounter for screening mammogram for malignant neoplasm of breast: Secondary | ICD-10-CM | POA: Diagnosis not present

## 2024-01-27 ENCOUNTER — Ambulatory Visit: Payer: Self-pay | Admitting: Physician Assistant

## 2024-02-17 ENCOUNTER — Encounter: Payer: Self-pay | Admitting: *Deleted

## 2024-02-17 ENCOUNTER — Telehealth: Payer: Self-pay

## 2024-02-17 NOTE — Telephone Encounter (Signed)
 Patient called the office to get a refill for [facility] or something similar. I;ve already informed patient TRIMO-SAN has been discontinued. Please advise. Also patient stated if an alternatives is available please send it to:  [facility] [address] [place], [place], [place] [address]

## 2024-02-20 ENCOUNTER — Other Ambulatory Visit: Payer: Self-pay | Admitting: Obstetrics and Gynecology

## 2024-02-20 DIAGNOSIS — N898 Other specified noninflammatory disorders of vagina: Secondary | ICD-10-CM

## 2024-02-20 MED ORDER — ACETIC ACID-OXYQUINOLINE 0.9-0.025 % VA GEL: 1.0000 | VAGINAL | 5 refills | Status: DC | PRN

## 2024-02-21 ENCOUNTER — Other Ambulatory Visit (INDEPENDENT_AMBULATORY_CARE_PROVIDER_SITE_OTHER): Payer: Self-pay | Admitting: Vascular Surgery

## 2024-03-07 DIAGNOSIS — K080 Exfoliation of teeth due to systemic causes: Secondary | ICD-10-CM | POA: Diagnosis not present

## 2024-03-15 ENCOUNTER — Encounter: Payer: Self-pay | Admitting: Obstetrics and Gynecology

## 2024-03-15 ENCOUNTER — Ambulatory Visit: Admitting: Obstetrics and Gynecology

## 2024-03-15 VITALS — BP 122/51 | HR 60

## 2024-03-15 DIAGNOSIS — N8110 Cystocele, unspecified: Secondary | ICD-10-CM

## 2024-03-15 DIAGNOSIS — Z960 Presence of urogenital implants: Secondary | ICD-10-CM

## 2024-03-15 DIAGNOSIS — N816 Rectocele: Secondary | ICD-10-CM

## 2024-03-15 DIAGNOSIS — N813 Complete uterovaginal prolapse: Secondary | ICD-10-CM

## 2024-03-15 DIAGNOSIS — N952 Postmenopausal atrophic vaginitis: Secondary | ICD-10-CM

## 2024-03-15 DIAGNOSIS — N898 Other specified noninflammatory disorders of vagina: Secondary | ICD-10-CM

## 2024-03-15 DIAGNOSIS — N812 Incomplete uterovaginal prolapse: Secondary | ICD-10-CM

## 2024-03-15 MED ORDER — ACETIC ACID-OXYQUINOLINE 0.9-0.025 % VA GEL: 1.0000 | VAGINAL | 5 refills | Status: DC | PRN

## 2024-03-15 MED ORDER — ESTRADIOL 0.1 MG/GM VA CREA
1.0000 | TOPICAL_CREAM | Freq: Every day | VAGINAL | 4 refills | Status: AC
Start: 2024-03-15 — End: ?

## 2024-03-15 NOTE — Progress Notes (Signed)
 [facility]   Subjective:     Chief Complaint:  No chief complaint on file.  History of Present Illness: Kazzandra Desaulniers is a 78 y.o. female with stage IV pelvic organ prolapse who presents for a pessary check. She is using a size 2 3/4in short stem gellhorn pessary. The pessary has been working well and she has no complaints. She is using vaginal estrogen. She denies vaginal bleeding.  Past Medical History: Patient  has a past medical history of Arthritis, Carotid arterial disease (HCC), Diabetes mellitus without complication (HCC), Diverticulosis, Environmental and seasonal allergies, Hyperlipidemia, Osteopenia, Sciatica (2024), Sinus congestion, Sleep apnea, and TIA (transient ischemic attack).   Past Surgical History: She  has a past surgical history that includes Tubal ligation; Breast surgery (Right, 2010); Esophagogastroduodenoscopy (egd) with propofol  (N/A, 12/29/2015); Cataract extraction w/PHACO (Right, 11/11/2016); Breast cyst aspiration (Right); Cataract extraction w/PHACO (Left, 09/26/2019); Breast biopsy (Right, 2010); and CAROTID PTA/STENT INTERVENTION (Right, 01/17/2023).   Medications: She has a current medication list which includes the following prescription(s): acetaminophen , acetic acid -oxyquinoline, alpha-lipoic acid, berberine chloride, biotin, blood glucose meter kit and supplies, blood glucose monitoring suppl, vitamin d , clopidogrel , vitamin b-12, estradiol , blood glucose test strips, lancet device, fish oil, trimo-san, red yeast rice extract, and trueplus lancets 33g.   Allergies: Patient is allergic to antihistamines, chlorpheniramine-type; macrobid [nitrofurantoin macrocrystal]; and statins.   Social History: Patient  reports that she has never smoked. She has never used smokeless tobacco. She reports that she does not drink alcohol and does not use drugs.      Objective:    Physical Exam: BP (!) 122/51   Pulse 60  Gen: No apparent distress, A&O  x 3. Detailed Urogynecologic Evaluation:  Pelvic Exam: Normal external female genitalia; Bartholin's and Skene's glands normal in appearance; urethral meatus normal in appearance, no urethral masses or discharge. The pessary was noted to be in place. It was removed and cleaned. Speculum exam revealed no lesions in the vagina. The pessary was replaced. It was comfortable to the patient and fit well.     Assessment/Plan:    Assessment: Ms. Beltran is a 78 y.o. with stage IV pelvic organ prolapse here for a pessary check. She is doing well.  Plan: She will keep the pessary in place until next visit. She will continue to use estrogen. She will follow-up in 3 months for a pessary check or sooner as needed.    All questions were answered.

## 2024-03-17 DIAGNOSIS — G4733 Obstructive sleep apnea (adult) (pediatric): Secondary | ICD-10-CM | POA: Diagnosis not present

## 2024-05-07 DIAGNOSIS — D3131 Benign neoplasm of right choroid: Secondary | ICD-10-CM | POA: Diagnosis not present

## 2024-05-07 DIAGNOSIS — H353131 Nonexudative age-related macular degeneration, bilateral, early dry stage: Secondary | ICD-10-CM | POA: Diagnosis not present

## 2024-05-07 DIAGNOSIS — E119 Type 2 diabetes mellitus without complications: Secondary | ICD-10-CM | POA: Diagnosis not present

## 2024-05-07 DIAGNOSIS — H43813 Vitreous degeneration, bilateral: Secondary | ICD-10-CM | POA: Diagnosis not present

## 2024-05-07 LAB — HM DIABETES EYE EXAM

## 2024-05-08 ENCOUNTER — Encounter: Payer: Self-pay | Admitting: Family Medicine

## 2024-05-19 ENCOUNTER — Encounter: Payer: Self-pay | Admitting: Family Medicine

## 2024-05-19 DIAGNOSIS — G4733 Obstructive sleep apnea (adult) (pediatric): Secondary | ICD-10-CM

## 2024-05-23 ENCOUNTER — Ambulatory Visit (INDEPENDENT_AMBULATORY_CARE_PROVIDER_SITE_OTHER): Admitting: Family Medicine

## 2024-05-23 ENCOUNTER — Encounter: Payer: Self-pay | Admitting: Family Medicine

## 2024-05-23 VITALS — BP 134/66 | HR 76 | Ht 65.0 in | Wt 156.6 lb

## 2024-05-23 DIAGNOSIS — E559 Vitamin D deficiency, unspecified: Secondary | ICD-10-CM | POA: Diagnosis not present

## 2024-05-23 DIAGNOSIS — E1169 Type 2 diabetes mellitus with other specified complication: Secondary | ICD-10-CM

## 2024-05-23 DIAGNOSIS — E785 Hyperlipidemia, unspecified: Secondary | ICD-10-CM

## 2024-05-23 DIAGNOSIS — H60392 Other infective otitis externa, left ear: Secondary | ICD-10-CM

## 2024-05-23 MED ORDER — OFLOXACIN 0.3 % OT SOLN: 10.0000 [drp] | Freq: Every day | OTIC | 0 refills | Status: AC

## 2024-05-23 NOTE — Progress Notes (Signed)
 Established patient visit   Patient: Christine Richards.   DOB: 1946-05-06   78 y.o. Female  MRN: [id number] Visit Date: 05/23/2024  Today's healthcare provider: LAURAINE LOISE BUOY, DO   Chief Complaint  Patient presents with   Ear Problem    Left side   Subjective    HPI Christine Richards is a 78 year old female who presents with severe itching in the left ear.  She has been experiencing severe itching in her left ear for several weeks, which has recently intensified, disrupting her sleep. The itching is described as 'crazy' and has led to soreness due to manipulation. No water  exposure to the ear is reported.  She also experiences a sensation of fullness on the left side of her face, including her nose, described as 'stopped up.' Pain in the ear occurs when sleeping on that side, waking her up at night. No significant itching or pain is present in the right ear, though minor symptoms occasionally occur.  There is a history of wax buildup in her ears, previously managed by an ENT through manual removal, though not done in several years. She reports a change in hearing, described as 'stopped up' and 'weird,' even with hearing aids.  She denies fever, chills, or cough but mentions feeling hot all the time, which she considers normal. She has not had a recent sleep study, and her CPAP machine recently indicated a motor issue. She is not currently seeing a cardiologist but is under vascular care.  Regarding medication history, she has experienced adverse reactions to several cholesterol-lowering medications, including statins and ezetimibe , causing muscle weakness, pain, and diarrhea. Red yeast rice resulted in hair loss, similar to statins. She is currently taking vitamin D  but has stopped B12 due to potential interactions with other medications.      Medications: Outpatient Medications Prior to Visit  Medication Sig   ALPHA LIPOIC ACID  PO Take by mouth.   Berberine Chloride 500 MG CAPS     BIOTIN PO Take by mouth.   blood glucose meter kit and supplies Dispense based on patient and insurance preference. To check sugar once daily.   Blood Glucose Monitoring Suppl DEVI 1 each by Does not apply route daily before breakfast. May substitute to any manufacturer covered by patient's insurance.   Cholecalciferol  (VITAMIN D ) 2000 UNITS CAPS Take 4,000 Units by mouth daily.   clopidogrel  (PLAVIX ) 75 MG tablet TAKE ONE TABLET (75 MG) BY MOUTH ONCE DAILY   Cyanocobalamin  (VITAMIN B-12) 1000 MCG SUBL Place 1 tablet (1,000 mcg total) under the tongue daily.   estradiol  (ESTRACE ) 0.1 MG/GM vaginal cream Place 1 Applicatorful vaginally at bedtime.   Glucose Blood (BLOOD GLUCOSE TEST STRIPS) STRP 1 each by In Vitro route daily before breakfast. May substitute to any manufacturer covered by patient's insurance.   Lancet Device MISC 1 each by Does not apply route daily before breakfast. May substitute to any manufacturer covered by patient's insurance.   Omega-3 Fatty Acids (FISH OIL) 1000 MG CAPS Take 1,000-2,000 mg by mouth at bedtime.    OXYQUINOLONE SULFATE VAGINAL (TRIMO-SAN) 0.025 % GEL Place 1 Application vaginally at bedtime as needed.   TRUEplus Lancets 33G MISC    [DISCONTINUED] Red Yeast Rice Extract (RED YEAST RICE PO) Take by mouth.   acetaminophen  (TYLENOL ) 160 MG/5ML elixir Take by mouth every 4 (four) hours as needed for fever.   Acetic Acid -Oxyquinoline 0.9-0.025 % GEL Place 1 Application vaginally as needed.   No  facility-administered medications prior to visit.        Objective    BP 134/66 (BP Location: Left Arm, Patient Position: Sitting, Cuff Size: Normal)   Pulse 76   Ht 5' 5 (1.651 m)   Wt 156 lb 9.6 oz (71 kg)   SpO2 96%   BMI 26.06 kg/m     Physical Exam Vitals and nursing note reviewed.  Constitutional:      General: She is not in acute distress.    Appearance: Normal appearance.  HENT:     Head: Normocephalic and atraumatic.     Right Ear: Tympanic  membrane normal. Decreased hearing (at baseline (wears hearing aids)) noted. No drainage or tenderness.     Left Ear: Tympanic membrane normal. Decreased hearing (muffled beyond baseline (wears hearing aids)) noted. Drainage and tenderness present.  Eyes:     General: No scleral icterus.    Conjunctiva/sclera: Conjunctivae normal.  Cardiovascular:     Rate and Rhythm: Normal rate.  Pulmonary:     Effort: Pulmonary effort is normal.  Neurological:     Mental Status: She is alert and oriented to person, place, and time. Mental status is at baseline.  Psychiatric:        Mood and Affect: Mood normal.        Behavior: Behavior normal.      No results found for any visits on 05/23/24.  Assessment & Plan    Other infective acute otitis externa of left ear -     Ofloxacin ; Place 10 drops into the left ear daily for 7 days.  Dispense: 5 mL; Refill: 0  Hyperlipidemia associated with type 2 diabetes mellitus (HCC) -     Hemoglobin A1c -     Lipid panel  Vitamin D  deficiency -     VITAMIN D  25 Hydroxy (Vit-D Deficiency, Fractures)     Other infective acute otitis externa of the left ear Severe itching and soreness in the left ear, with sensation of fullness. Assessed as an external ear infection. - Prescribed ear drops once daily for ten days. - Advised sleeping on the opposite side. - Discussed ear irrigation but recommended using drops.  Hyperlipidemia associated with Type 2 diabetes mellitus Intolerance to statins with adverse effects. Has not tried bempedoic acid or Repatha  (refused Repatha ). A1c has been over 7, reaching up to 7.8. Goal is to maintain A1c under 8 due to age-related risks. - Ordered A1c test. - Advised on lifestyle modifications including carbohydrate restriction and exercise. - Ordered cholesterol panel. - Discussed potential use of bempedoic acid and Repatha .  Vitamin D  deficiency Currently taking vitamin D  supplements. Follow-up test needed to assess  current levels. - Ordered vitamin D  level test.    Return if symptoms worsen or fail to improve, and as scheduled in January 2026.      I discussed the assessment and treatment plan with the patient  The patient was provided an opportunity to ask questions and all were answered. The patient agreed with the plan and demonstrated an understanding of the instructions.   The patient was advised to call back or seek an in-person evaluation if the symptoms worsen or if the condition fails to improve as anticipated.    LAURAINE LOISE BUOY, DO  [facility] [phone] (phone) [phone] (fax)  [facility]

## 2024-05-31 DIAGNOSIS — E1169 Type 2 diabetes mellitus with other specified complication: Secondary | ICD-10-CM | POA: Diagnosis not present

## 2024-05-31 DIAGNOSIS — E559 Vitamin D deficiency, unspecified: Secondary | ICD-10-CM | POA: Diagnosis not present

## 2024-05-31 DIAGNOSIS — E785 Hyperlipidemia, unspecified: Secondary | ICD-10-CM | POA: Diagnosis not present

## 2024-06-01 LAB — LIPID PANEL
Chol/HDL Ratio: 5.2 ratio — ABNORMAL HIGH (ref 0.0–4.4)
Cholesterol, Total: 298 mg/dL — ABNORMAL HIGH (ref 100–199)
HDL: 57 mg/dL (ref >39)
LDL Chol Calc (NIH): 218 mg/dL — ABNORMAL HIGH (ref 0–99)
Triglycerides: 128 mg/dL (ref 0–149)
VLDL Cholesterol Cal: 23 mg/dL (ref 5–40)

## 2024-06-01 LAB — HEMOGLOBIN A1C
Est. average glucose Bld gHb Est-mCnc: 174 mg/dL
Hgb A1c MFr Bld: 7.7 % — ABNORMAL HIGH (ref 4.8–5.6)

## 2024-06-01 LAB — VITAMIN D 25 HYDROXY (VIT D DEFICIENCY, FRACTURES): Vit D, 25-Hydroxy: 44.5 ng/mL (ref 30.0–100.0)

## 2024-06-05 ENCOUNTER — Ambulatory Visit: Payer: Self-pay | Admitting: Family Medicine

## 2024-06-18 ENCOUNTER — Encounter: Payer: Self-pay | Admitting: Obstetrics and Gynecology

## 2024-06-18 ENCOUNTER — Ambulatory Visit: Admitting: Obstetrics and Gynecology

## 2024-06-18 VITALS — BP 144/78 | HR 67

## 2024-06-18 DIAGNOSIS — N813 Complete uterovaginal prolapse: Secondary | ICD-10-CM

## 2024-06-18 DIAGNOSIS — N816 Rectocele: Secondary | ICD-10-CM

## 2024-06-18 DIAGNOSIS — N8110 Cystocele, unspecified: Secondary | ICD-10-CM

## 2024-06-18 DIAGNOSIS — Z960 Presence of urogenital implants: Secondary | ICD-10-CM | POA: Diagnosis not present

## 2024-06-18 DIAGNOSIS — N812 Incomplete uterovaginal prolapse: Secondary | ICD-10-CM

## 2024-06-18 NOTE — Progress Notes (Signed)
 [facility]   Subjective:     Chief Complaint:  No chief complaint on file.  History of Present Illness: Christine Richards is a 78 y.o. female with stage IV pelvic organ prolapse who presents for a pessary check. She is using a size 2 3/4in short stem gellhorn pessary. The pessary has been working well and she has no complaints. She is using vaginal estrogen. She denies vaginal bleeding.  Past Medical History: Patient  has a past medical history of Arthritis, Carotid arterial disease, Diabetes mellitus without complication (HCC), Diverticulosis, Environmental and seasonal allergies, Hyperlipidemia, Osteopenia, Sciatica (2024), Sinus congestion, Sleep apnea, and TIA (transient ischemic attack).   Past Surgical History: She  has a past surgical history that includes Tubal ligation; Breast surgery (Right, 2010); Esophagogastroduodenoscopy (egd) with propofol  (N/A, 12/29/2015); Cataract extraction w/PHACO (Right, 11/11/2016); Breast cyst aspiration (Right); Cataract extraction w/PHACO (Left, 09/26/2019); Breast biopsy (Right, 2010); and CAROTID PTA/STENT INTERVENTION (Right, 01/17/2023).   Medications: She has a current medication list which includes the following prescription(s): alpha-lipoic acid, berberine chloride, biotin, blood glucose meter kit and supplies, blood glucose monitoring suppl, vitamin d , clopidogrel , vitamin b-12, estradiol , blood glucose test strips, lancet device, fish oil, trimo-san, and trueplus lancets 33g.   Allergies: Patient is allergic to antihistamines, chlorpheniramine-type; macrobid [nitrofurantoin macrocrystal]; and statins.   Social History: Patient  reports that she has never smoked. She has never used smokeless tobacco. She reports that she does not drink alcohol and does not use drugs.      Objective:    Physical Exam: BP (!) 144/78 (BP Location: Right Arm, Patient Position: Sitting)   Pulse 67  Gen: No apparent distress, A&O x 3. Detailed  Urogynecologic Evaluation:  Pelvic Exam: Normal external female genitalia; Bartholin's and Skene's glands normal in appearance; urethral meatus normal in appearance, no urethral masses or discharge. Lidocaine  jelly applied to the vaginal opening. The pessary was noted to be in place. It was removed and cleaned. Speculum exam revealed no lesions in the vagina. The pessary was replaced. It was comfortable to the patient and fit well.     Assessment/Plan:    Assessment: Christine Richards is a 78 y.o. with stage IV pelvic organ prolapse here for a pessary check. She is doing well.  Plan: She will keep the pessary in place until next visit. She will continue to use estrogen. She will follow-up in 3 months for a pessary check or sooner as needed.    All questions were answered.

## 2024-06-29 ENCOUNTER — Other Ambulatory Visit: Payer: Self-pay | Admitting: Family Medicine

## 2024-06-29 DIAGNOSIS — E1165 Type 2 diabetes mellitus with hyperglycemia: Secondary | ICD-10-CM

## 2024-06-29 MED ORDER — BLOOD GLUCOSE TEST VI STRP: 1.0000 | ORAL_STRIP | Freq: Every day | 3 refills | Status: AC

## 2024-06-29 MED ORDER — BLOOD GLUCOSE MONITORING SUPPL DEVI: 1.0000 | Freq: Every day | 0 refills | Status: AC

## 2024-07-09 ENCOUNTER — Ambulatory Visit: Attending: Sleep Medicine

## 2024-07-09 DIAGNOSIS — G4761 Periodic limb movement disorder: Secondary | ICD-10-CM | POA: Insufficient documentation

## 2024-07-09 DIAGNOSIS — G4733 Obstructive sleep apnea (adult) (pediatric): Secondary | ICD-10-CM | POA: Diagnosis not present

## 2024-07-12 DIAGNOSIS — G4733 Obstructive sleep apnea (adult) (pediatric): Secondary | ICD-10-CM | POA: Diagnosis not present

## 2024-07-16 ENCOUNTER — Ambulatory Visit (INDEPENDENT_AMBULATORY_CARE_PROVIDER_SITE_OTHER): Payer: Self-pay | Admitting: Sleep Medicine

## 2024-08-02 ENCOUNTER — Encounter: Payer: Self-pay | Admitting: Family Medicine

## 2024-09-10 ENCOUNTER — Encounter: Payer: Self-pay | Admitting: Family Medicine

## 2024-09-10 ENCOUNTER — Encounter: Payer: Self-pay | Admitting: *Deleted

## 2024-09-17 ENCOUNTER — Ambulatory Visit: Admitting: Obstetrics and Gynecology

## 2024-09-17 ENCOUNTER — Encounter: Payer: Self-pay | Admitting: *Deleted

## 2024-09-20 ENCOUNTER — Other Ambulatory Visit (INDEPENDENT_AMBULATORY_CARE_PROVIDER_SITE_OTHER): Payer: Self-pay | Admitting: Vascular Surgery

## 2024-09-25 NOTE — Telephone Encounter (Signed)
 I have ordered CPAP through [facility] 09/25/24.

## 2024-09-26 ENCOUNTER — Encounter: Payer: Self-pay | Admitting: Obstetrics and Gynecology

## 2024-09-26 ENCOUNTER — Encounter: Payer: Self-pay | Admitting: Family Medicine

## 2024-09-26 ENCOUNTER — Ambulatory Visit: Admitting: Obstetrics and Gynecology

## 2024-09-26 VITALS — BP 136/71 | HR 69

## 2024-09-26 DIAGNOSIS — I480 Paroxysmal atrial fibrillation: Secondary | ICD-10-CM

## 2024-09-26 DIAGNOSIS — Z960 Presence of urogenital implants: Secondary | ICD-10-CM | POA: Diagnosis not present

## 2024-09-26 DIAGNOSIS — I959 Hypotension, unspecified: Secondary | ICD-10-CM

## 2024-09-26 DIAGNOSIS — G4733 Obstructive sleep apnea (adult) (pediatric): Secondary | ICD-10-CM

## 2024-09-26 DIAGNOSIS — R591 Generalized enlarged lymph nodes: Secondary | ICD-10-CM

## 2024-09-26 DIAGNOSIS — E1165 Type 2 diabetes mellitus with hyperglycemia: Secondary | ICD-10-CM

## 2024-09-26 DIAGNOSIS — E559 Vitamin D deficiency, unspecified: Secondary | ICD-10-CM

## 2024-09-26 DIAGNOSIS — E538 Deficiency of other specified B group vitamins: Secondary | ICD-10-CM

## 2024-09-26 DIAGNOSIS — E1169 Type 2 diabetes mellitus with other specified complication: Secondary | ICD-10-CM

## 2024-09-26 DIAGNOSIS — M85832 Other specified disorders of bone density and structure, left forearm: Secondary | ICD-10-CM

## 2024-09-26 DIAGNOSIS — N812 Incomplete uterovaginal prolapse: Secondary | ICD-10-CM

## 2024-09-26 DIAGNOSIS — N813 Complete uterovaginal prolapse: Secondary | ICD-10-CM | POA: Diagnosis not present

## 2024-09-26 NOTE — Progress Notes (Signed)
 [facility]   Subjective:     Chief Complaint:  Chief Complaint  Patient presents with   Pessary Check    Christine Richards is a 79 y.o. female is here for pessary check.   History of Present Illness: Christine Richards is a 79 y.o. female with stage IV pelvic organ prolapse who presents for a pessary check. She is using a size 2 3/4in short stem gellhorn pessary. The pessary has been working well. She does notice a vaginal odor occasionally but no discharge.  She is using vaginal estrogen. She denies vaginal bleeding. Overall she is happy with the pessary.   Past Medical History: Patient  has a past medical history of Arthritis, Carotid arterial disease, Diabetes mellitus without complication (HCC), Diverticulosis, Environmental and seasonal allergies, Hyperlipidemia, Osteopenia, Sciatica (2024), Sinus congestion, Sleep apnea, and TIA (transient ischemic attack).   Past Surgical History: She  has a past surgical history that includes Tubal ligation; Breast surgery (Right, 2010); Esophagogastroduodenoscopy (egd) with propofol  (N/A, 12/29/2015); Cataract extraction w/PHACO (Right, 11/11/2016); Breast cyst aspiration (Right); Cataract extraction w/PHACO (Left, 09/26/2019); Breast biopsy (Right, 2010); and CAROTID PTA/STENT INTERVENTION (Right, 01/17/2023).   Medications: She has a current medication list which includes the following prescription(s): alpha-lipoic acid, berberine chloride, biotin, blood glucose meter kit and supplies, blood glucose monitoring suppl, vitamin d , clopidogrel , vitamin b-12, estradiol , blood glucose test strips, lancet device, fish oil, trimo-san, and trueplus lancets 33g.   Allergies: Patient is allergic to antihistamines, chlorpheniramine-type; macrobid [nitrofurantoin macrocrystal]; and statins.   Social History: Patient  reports that she has never smoked. She has never used smokeless tobacco. She reports that she does not drink alcohol and does not use  drugs.      Objective:    Physical Exam: BP 136/71   Pulse 69  Gen: No apparent distress, A&O x 3. Detailed Urogynecologic Evaluation:  Pelvic Exam: Normal external female genitalia; Bartholin's and Skene's glands normal in appearance; urethral meatus normal in appearance, no urethral masses or discharge. Lidocaine  jelly applied to the vaginal opening. The pessary was noted to be in place. It was removed and cleaned. Speculum exam some granulation tissue on the anterior cervix, this was treated with silver nitrate. The pessary was replaced. It was comfortable to the patient and fit well.   Assessment/Plan:    Assessment: Ms. Ganesh is a 79 y.o. with stage IV pelvic organ prolapse here for a pessary check. She is doing well.  Plan: She will keep the pessary in place until next visit. She will continue to use estrogen. She will follow-up in 3 months for a pessary check or sooner as needed.    All questions were answered.    Time spent: I spent 20 minutes dedicated to the care of this patient on the date of this encounter to include pre-visit review of records, face-to-face time with the patient and post visit documentation.

## 2024-09-27 ENCOUNTER — Telehealth: Payer: Self-pay

## 2024-09-27 NOTE — Telephone Encounter (Signed)
 A order was placed through [facility] for pt on CPAP machine. Please review the notes and provide them with what's being requested.

## 2024-09-27 NOTE — Telephone Encounter (Signed)
 Reviewed the notes that were in [facility] regarding this order, looks like there should have been some documents submitted when the original  order was placed.  Had to pull the order back and print the required documents to proceed with the order being approved, faxed the documents that were required and also documented this in [facility] to reflect what was corrected.

## 2024-10-23 ENCOUNTER — Encounter: Admitting: Family Medicine

## 2024-11-13 ENCOUNTER — Encounter (INDEPENDENT_AMBULATORY_CARE_PROVIDER_SITE_OTHER)

## 2024-11-13 ENCOUNTER — Ambulatory Visit (INDEPENDENT_AMBULATORY_CARE_PROVIDER_SITE_OTHER): Admitting: Vascular Surgery

## 2024-11-14 ENCOUNTER — Ambulatory Visit

## 2024-12-26 ENCOUNTER — Ambulatory Visit: Admitting: Obstetrics and Gynecology
